# Patient Record
Sex: Male | Born: 1940 | Race: White | Hispanic: No | Marital: Married | Smoking: Former smoker
Health system: Southern US, Community
[De-identification: ages and names within clinical notes are randomized; demographics above are authoritative.]

## PROBLEM LIST (undated history)

## (undated) DIAGNOSIS — I10 Essential (primary) hypertension: Secondary | ICD-10-CM

## (undated) DIAGNOSIS — N400 Enlarged prostate without lower urinary tract symptoms: Secondary | ICD-10-CM

## (undated) DIAGNOSIS — N189 Chronic kidney disease, unspecified: Secondary | ICD-10-CM

## (undated) DIAGNOSIS — F419 Anxiety disorder, unspecified: Secondary | ICD-10-CM

## (undated) DIAGNOSIS — S2239XA Fracture of one rib, unspecified side, initial encounter for closed fracture: Secondary | ICD-10-CM

## (undated) DIAGNOSIS — E785 Hyperlipidemia, unspecified: Secondary | ICD-10-CM

## (undated) DIAGNOSIS — S22009A Unspecified fracture of unspecified thoracic vertebra, initial encounter for closed fracture: Secondary | ICD-10-CM

## (undated) DIAGNOSIS — R3912 Poor urinary stream: Secondary | ICD-10-CM

## (undated) DIAGNOSIS — I6529 Occlusion and stenosis of unspecified carotid artery: Secondary | ICD-10-CM

## (undated) DIAGNOSIS — I4891 Unspecified atrial fibrillation: Secondary | ICD-10-CM

## (undated) DIAGNOSIS — I739 Peripheral vascular disease, unspecified: Secondary | ICD-10-CM

## (undated) DIAGNOSIS — J942 Hemothorax: Secondary | ICD-10-CM

## (undated) DIAGNOSIS — R972 Elevated prostate specific antigen [PSA]: Secondary | ICD-10-CM

## (undated) DIAGNOSIS — G4700 Insomnia, unspecified: Secondary | ICD-10-CM

## (undated) DIAGNOSIS — I779 Disorder of arteries and arterioles, unspecified: Secondary | ICD-10-CM

## (undated) HISTORY — DX: Essential (primary) hypertension: I10

## (undated) HISTORY — DX: Benign prostatic hyperplasia without lower urinary tract symptoms: N40.0

## (undated) HISTORY — DX: Elevated prostate specific antigen (PSA): R97.20

## (undated) HISTORY — DX: Hemothorax: J94.2

## (undated) HISTORY — DX: Poor urinary stream: R39.12

## (undated) HISTORY — PX: OTHER SURGICAL HISTORY: SHX169

## (undated) HISTORY — PX: APPENDECTOMY: SHX54

## (undated) HISTORY — DX: Chronic kidney disease, unspecified: N18.9

## (undated) HISTORY — DX: Hyperlipidemia, unspecified: E78.5

## (undated) HISTORY — DX: Insomnia, unspecified: G47.00

## (undated) HISTORY — DX: Fracture of one rib, unspecified side, initial encounter for closed fracture: S22.39XA

## (undated) HISTORY — DX: Anxiety disorder, unspecified: F41.9

## (undated) HISTORY — DX: Occlusion and stenosis of unspecified carotid artery: I65.29

## (undated) HISTORY — DX: Unspecified fracture of unspecified thoracic vertebra, initial encounter for closed fracture: S22.009A

---

## 1998-11-23 ENCOUNTER — Ambulatory Visit (HOSPITAL_COMMUNITY): Admission: RE | Admit: 1998-11-23 | Discharge: 1998-11-23 | Payer: Self-pay | Admitting: *Deleted

## 2012-12-25 ENCOUNTER — Emergency Department: Payer: Self-pay | Admitting: Emergency Medicine

## 2012-12-25 LAB — COMPREHENSIVE METABOLIC PANEL
Albumin: 3.7 g/dL (ref 3.4–5.0)
Alkaline Phosphatase: 87 U/L (ref 50–136)
Anion Gap: 8 (ref 7–16)
BUN: 14 mg/dL (ref 7–18)
Chloride: 103 mmol/L (ref 98–107)
Co2: 26 mmol/L (ref 21–32)
Creatinine: 1.27 mg/dL (ref 0.60–1.30)
EGFR (African American): >60
EGFR (Non-African Amer.): 56 — ABNORMAL LOW
Glucose: 173 mg/dL — ABNORMAL HIGH (ref 65–99)
Sodium: 137 mmol/L (ref 136–145)

## 2012-12-25 LAB — CBC WITH DIFFERENTIAL/PLATELET
Basophil #: 0.1 10*3/uL (ref 0.0–0.1)
Eosinophil %: 0.1 %
HCT: 36.3 % — ABNORMAL LOW (ref 40.0–52.0)
HGB: 12.7 g/dL — ABNORMAL LOW (ref 13.0–18.0)
Lymphocyte #: 0.5 10*3/uL — ABNORMAL LOW (ref 1.0–3.6)
MCHC: 34.9 g/dL (ref 32.0–36.0)
Monocyte %: 5.2 %
Neutrophil #: 16.7 10*3/uL — ABNORMAL HIGH (ref 1.4–6.5)
Neutrophil %: 91.2 %
RDW: 12 % (ref 11.5–14.5)
WBC: 18.3 10*3/uL — ABNORMAL HIGH (ref 3.8–10.6)

## 2012-12-25 LAB — URINALYSIS, COMPLETE
Bilirubin,UR: NEGATIVE
Glucose,UR: NEGATIVE mg/dL (ref 0–75)
Hyaline Cast: 2
Ph: 8 (ref 4.5–8.0)
RBC,UR: 7 /HPF (ref 0–5)
Specific Gravity: 1.017 (ref 1.003–1.030)

## 2012-12-27 LAB — URINE CULTURE

## 2012-12-29 LAB — URINALYSIS, COMPLETE
Bilirubin,UR: NEGATIVE
Nitrite: NEGATIVE
Protein: 100
RBC,UR: 22 /HPF (ref 0–5)
Specific Gravity: 1.023 (ref 1.003–1.030)
Squamous Epithelial: NONE SEEN

## 2012-12-29 LAB — CBC
HCT: 30.8 % — ABNORMAL LOW (ref 40.0–52.0)
HGB: 10.4 g/dL — ABNORMAL LOW (ref 13.0–18.0)
MCH: 32.2 pg (ref 26.0–34.0)
MCHC: 33.9 g/dL (ref 32.0–36.0)
Platelet: 170 10*3/uL (ref 150–440)
WBC: 7.5 10*3/uL (ref 3.8–10.6)

## 2012-12-29 LAB — BASIC METABOLIC PANEL
Anion Gap: 4 — ABNORMAL LOW (ref 7–16)
BUN: 23 mg/dL — ABNORMAL HIGH (ref 7–18)
Chloride: 107 mmol/L (ref 98–107)
Co2: 26 mmol/L (ref 21–32)
EGFR (African American): 59 — ABNORMAL LOW
EGFR (Non-African Amer.): 51 — ABNORMAL LOW
Sodium: 137 mmol/L (ref 136–145)

## 2012-12-30 ENCOUNTER — Inpatient Hospital Stay: Payer: Self-pay | Admitting: Internal Medicine

## 2012-12-31 LAB — CBC WITH DIFFERENTIAL/PLATELET
Basophil #: 0 10*3/uL (ref 0.0–0.1)
Basophil %: 0.1 %
Eosinophil #: 0 10*3/uL (ref 0.0–0.7)
Eosinophil %: 0 %
HCT: 28.9 % — ABNORMAL LOW (ref 40.0–52.0)
HGB: 10.1 g/dL — ABNORMAL LOW (ref 13.0–18.0)
Lymphocyte #: 0.6 10*3/uL — ABNORMAL LOW (ref 1.0–3.6)
Lymphocyte %: 5.8 %
MCH: 32.9 pg (ref 26.0–34.0)
MCHC: 35 g/dL (ref 32.0–36.0)
Monocyte #: 0.4 x10 3/mm (ref 0.2–1.0)
Neutrophil #: 9.3 10*3/uL — ABNORMAL HIGH (ref 1.4–6.5)
Neutrophil %: 90.6 %
RBC: 3.07 10*6/uL — ABNORMAL LOW (ref 4.40–5.90)
WBC: 10.2 10*3/uL (ref 3.8–10.6)

## 2012-12-31 LAB — BASIC METABOLIC PANEL
Anion Gap: 9 (ref 7–16)
Calcium, Total: 9 mg/dL (ref 8.5–10.1)
Chloride: 109 mmol/L — ABNORMAL HIGH (ref 98–107)
Co2: 21 mmol/L (ref 21–32)
Creatinine: 1.21 mg/dL (ref 0.60–1.30)
EGFR (African American): >60
EGFR (Non-African Amer.): 60 — ABNORMAL LOW
Osmolality: 286 (ref 275–301)
Sodium: 139 mmol/L (ref 136–145)

## 2012-12-31 LAB — CULTURE, BLOOD (SINGLE)

## 2013-01-04 LAB — CULTURE, BLOOD (SINGLE)

## 2013-07-17 ENCOUNTER — Ambulatory Visit: Payer: Self-pay | Admitting: Vascular Surgery

## 2013-07-21 ENCOUNTER — Ambulatory Visit: Payer: Self-pay | Admitting: Vascular Surgery

## 2013-08-10 ENCOUNTER — Inpatient Hospital Stay: Payer: Self-pay | Admitting: Vascular Surgery

## 2013-08-10 LAB — BUN: BUN: 28 mg/dL — ABNORMAL HIGH (ref 7–18)

## 2013-08-10 LAB — CREATININE, SERUM
Creatinine: 1.57 mg/dL — ABNORMAL HIGH (ref 0.60–1.30)
EGFR (African American): 50 — ABNORMAL LOW
EGFR (Non-African Amer.): 43 — ABNORMAL LOW

## 2013-08-11 LAB — BASIC METABOLIC PANEL
BUN: 18 mg/dL (ref 7–18)
Chloride: 107 mmol/L (ref 98–107)
EGFR (Non-African Amer.): 49 — ABNORMAL LOW
Osmolality: 280 (ref 275–301)
Potassium: 4.2 mmol/L (ref 3.5–5.1)
Sodium: 139 mmol/L (ref 136–145)

## 2013-08-11 LAB — APTT: Activated PTT: 34.3 secs (ref 23.6–35.9)

## 2013-08-11 LAB — CBC WITH DIFFERENTIAL/PLATELET
Basophil %: 0.3 %
Eosinophil #: 0.1 10*3/uL (ref 0.0–0.7)
HCT: 34.9 % — ABNORMAL LOW (ref 40.0–52.0)
HGB: 12.6 g/dL — ABNORMAL LOW (ref 13.0–18.0)
Lymphocyte #: 1.3 10*3/uL (ref 1.0–3.6)
MCHC: 36 g/dL (ref 32.0–36.0)
MCV: 95 fL (ref 80–100)
Monocyte #: 0.6 x10 3/mm (ref 0.2–1.0)
Monocyte %: 9.2 %
Neutrophil #: 4.8 10*3/uL (ref 1.4–6.5)
Neutrophil %: 70 %
Platelet: 154 10*3/uL (ref 150–440)
RDW: 12 % (ref 11.5–14.5)

## 2013-08-11 LAB — PROTIME-INR
INR: 1.1
Prothrombin Time: 14.5 secs (ref 11.5–14.7)

## 2014-10-08 IMAGING — CR DG CHEST 2V
1 series · 2 of 2 positions shown · non-contrast
Comparison: none

REASON FOR EXAM: lll pna
COMMENTS:

[Series 6: w chest pa · 0.14mm/px · 2 of 2 slices shown]
[im 1/2]
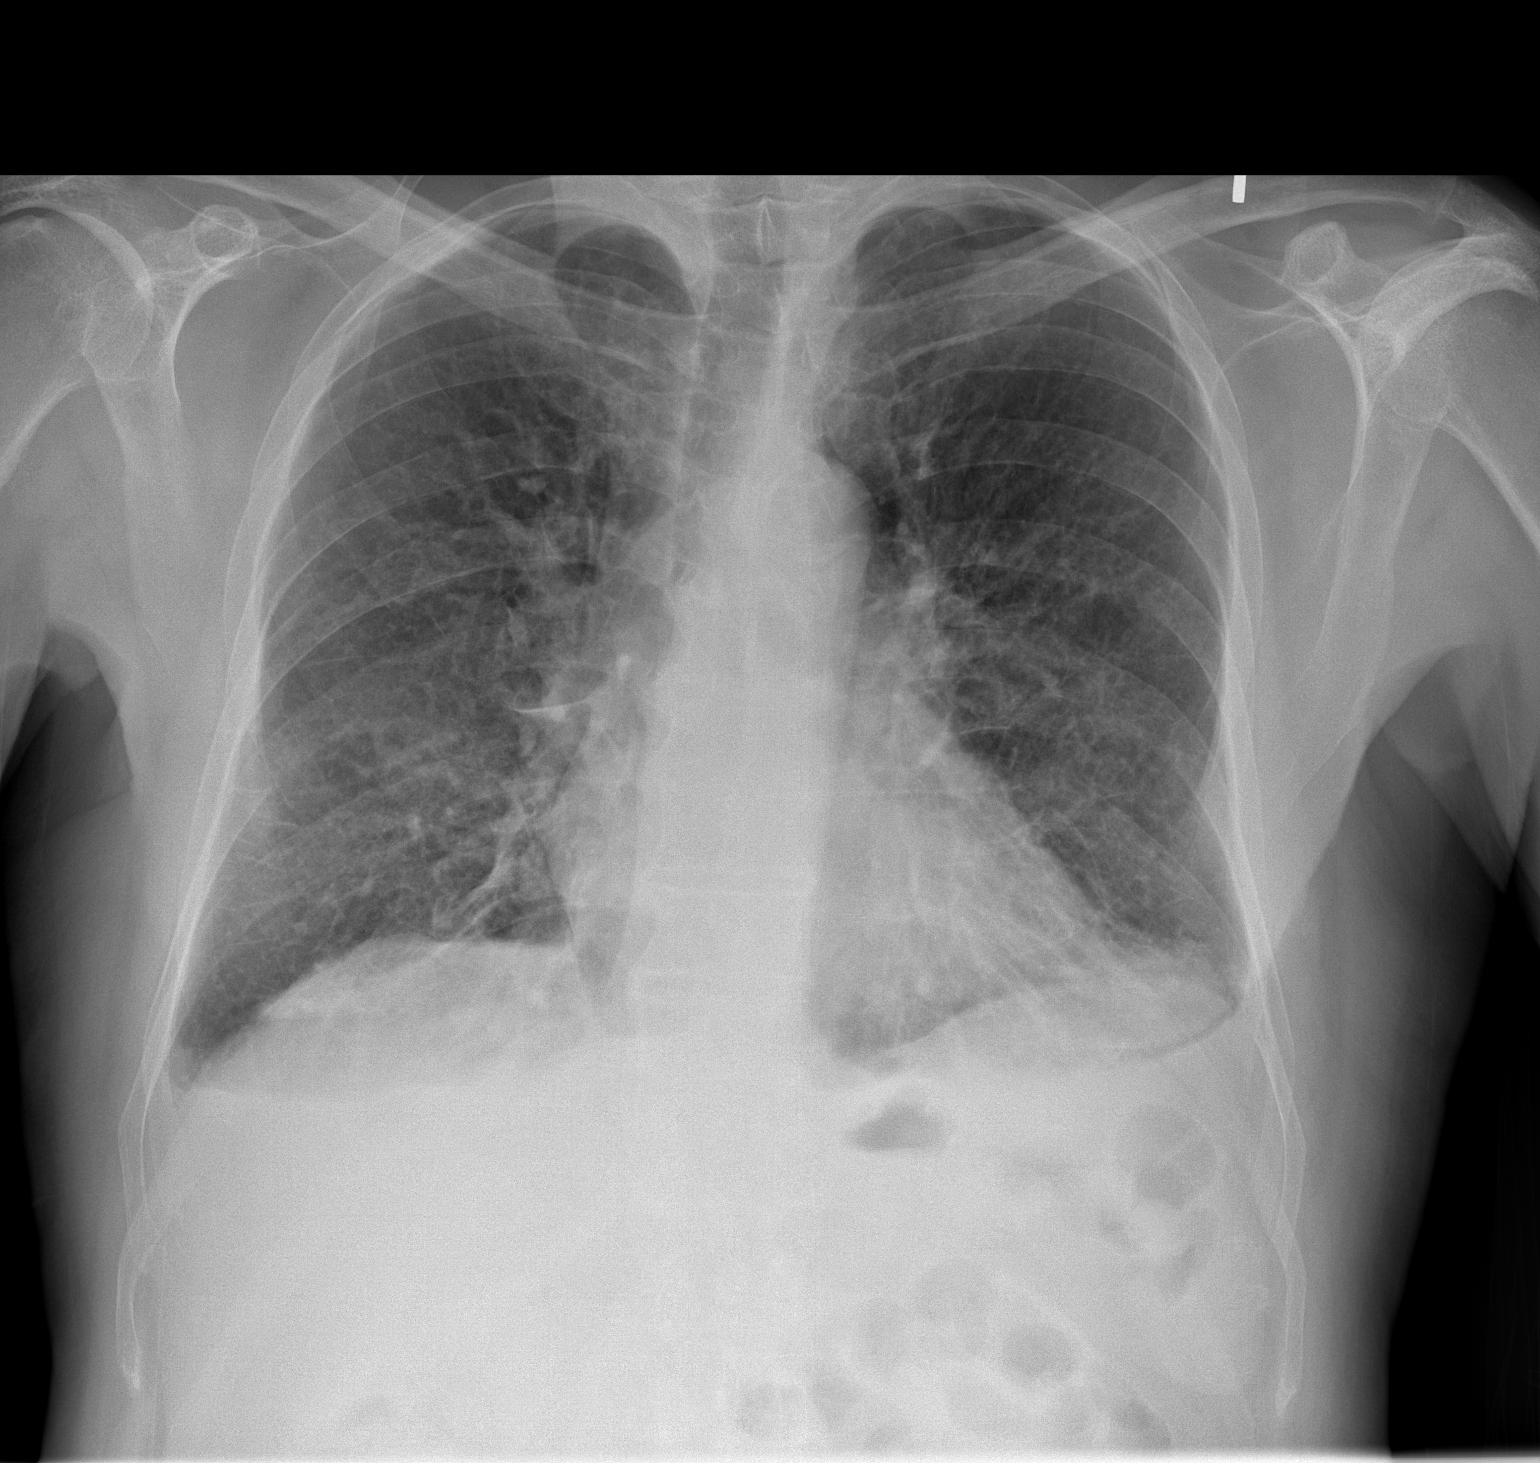
[im 2/2]
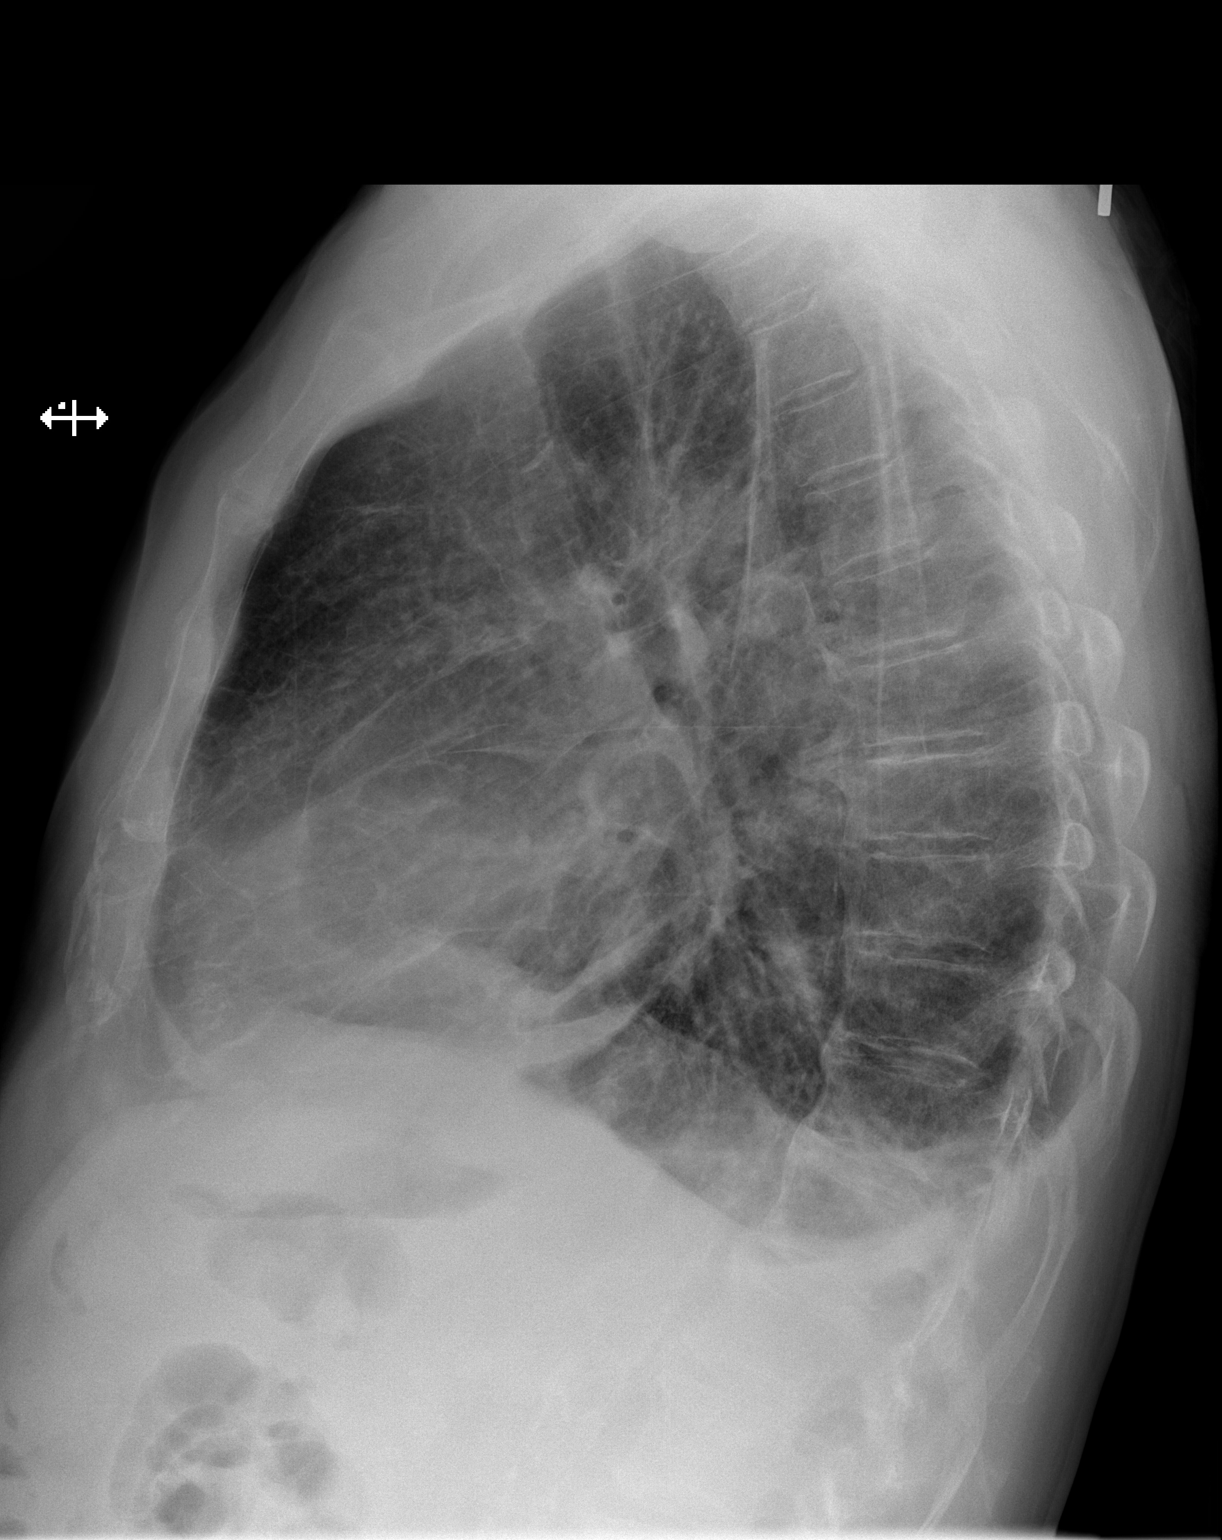

[2 of 2 positions shown; findings below may reference images not displayed]

PROCEDURE:     DXR - DXR CHEST PA (OR AP) AND LATERAL  - December 31, 2012 [DATE]

RESULT:     Comparison is made to the previous examination 29 December, 2012. There is prominence of the interstitium diffusely. The heart is normal
in size. There are small bilateral effusions. No focal consolidation is
evident. Bony structures are unremarkable.
IMPRESSION: Diffuse prominence of the interstitial markings. Small
bilateral effusions.

[REDACTED]

## 2015-02-25 NOTE — H&P (Signed)
PATIENT NAME:  Xavier Davis, TUCH MR#:  [id number] DATE OF BIRTH:  1940/11/08  DATE OF ADMISSION:  12/30/2012  PRIMARY CARE PHYSICIAN: Located at the [facility].  CHIEF COMPLAINT: Dysuria, cough and bacteremia.   HISTORY OF PRESENT ILLNESS: This is a 74 year old male patient with history of hypertension, diabetes mellitus, syncope and carotid artery stenosis who presents to the Emergency Room today after being called to come to the Emergency Room due to bacteremia. The patient had carotid endarterectomy done at the [place] on 12/23/2012 after an episode of syncope. The patient was later seen in the Emergency Room here at [facility], on 12/25/2012, with some cough and dysuria. The patient was found to have UTI and atelectasis and was started on Levaquin and sent home in a stable condition. As insurance could not cover his Levaquin, the patient was switched to amoxicillin. Today his blood cultures are positive for Serratia and the patient has been asked to return to the Emergency Room. He continues to have dysuria, dark urine and cough which is worsening. Does not have any shortness of breath or productive sputum. No abdominal pain. No CVA tenderness. Fever of 100.4 today in the Emergency Room.   PAST MEDICAL HISTORY: Hypertension, diabetes mellitus, syncope and carotid stenosis.   PAST SURGICAL HISTORY: Left carotid endarterectomy.   SOCIAL HISTORY: The patient does not smoke, no alcohol, no illicit drugs. Lives with his wife. Ambulates on his own.   CODE STATUS: FULL CODE.   FAMILY HISTORY: Positive for hypertension and diabetes.   REVIEW OF SYSTEMS: CONSTITUTIONAL: Complains of fever and fatigue.  EYES: No blurred vision, pain or redness.  ENT: No tinnitus, ear pain or hearing loss.  RESPIRATORY: Has cough. No dyspnea. Has some wheezing.  CARDIOVASCULAR: No chest pain, orthopnea or edema.  GASTROINTESTINAL: No nausea, vomiting, diarrhea or abdominal pain.  GENITOURINARY:  Has dysuria and hematuria. Has dark urine and frequency.  ENDOCRINE: No polyuria, nocturia or thyroid problems.  HEMATOLOGIC/LYMPHATIC: No anemia, easy bruising or bleeding.  INTEGUMENTARY: No acne, rash or lesions. MUSCULOSKELETAL: No back pain or arthritis. NEUROLOGIC: No focal numbness, weakness or dysarthria. Had recent syncope.  PSYCHIATRIC: No anxiety or depression.   HOME MEDICATIONS: 1.  Aspirin 325 mg oral once a day.  2.  Cetirizine 10 mg oral once a day. 3.  Citalopram 20 mg oral once a day.  4.  Amoxicillin 500 mg oral twice a day.  5.  Lovastatin 20 mg oral once a day.  6.  Oxycodone 5 mg 1 tablet oral every 4 hours as needed for pain.  7.  Terazosin 2 mg oral once a day at bedtime.  8.  Trazodone 50 mg oral once a day at bedtime.   PHYSICAL EXAMINATION: VITAL SIGNS: Temperature 100.4, pulse 78, respirations 18, blood pressure 122/61. GENERAL: Elderly Caucasian male patient lying in bed, coughing.  PSYCHIATRIC: Alert, oriented x 3. Mood and affect appropriate. Judgment intact.  HEENT: Atraumatic, normocephalic. Oral mucosa moist and pink. External ears and nose normal. No pallor. No icterus. Pupils bilaterally equal and reactive to light.  NECK: Supple. No thyromegaly. No palpable lymph nodes. Trachea midline. No carotid bruit or JVD.  HEART: S1 and S2 regular rate and rhythm without any murmurs.  LUNGS: Normal work of breathing. Clear to auscultation on both sides.  ABDOMEN: Soft abdomen, nontender. Bowel sounds present. No hepatosplenomegaly palpable.  GENITOURINARY: No CVA tenderness or bladder distention. Has suprapubic tenderness.  SKIN: Warm and dry. No petechiae, rash or ulcers. Has  surgical wound over the left carotid.  MUSCULOSKELETAL: No joint swelling, redness or effusion of the large joints. Normal muscle tone.  NEUROLOGICAL: Motor strength 5 out of 5 in upper and lower extremities. Sensation to fine touch intact all over. Cranial nerves II through XII intact.   LYMPHATIC: No cervical lymphadenopathy.   LABS STUDIES: Glucose 125, BUN 23, creatinine 1.39, sodium 137, potassium 4.9. GFR 51. WBC 7.5, hemoglobin 10.4 and platelets 170.   Urine cultures from 12/25/2012 have Serratia sensitive to ceftriaxone, Levaquin, ciprofloxacin and Bactrim.   Blood cultures have Serratia positive.   RADIOLOGIC DATA: Chest x-ray shows left lower lobe pneumonia with small effusion.   ASSESSMENT AND PLAN:  1.  Serratia bacteremia with urinary tract infection. Will start the patient on Levaquin, repeat blood cultures and urine cultures. The patient has a fever of 100.4 and will need to be admitted as inpatient and started on IV antibiotics. No sepsis.  2.  Left lower lobe pneumonia with synpneumonic effusion. We will continue the Levaquin. The patient did not have greater than 48 hour stay in the hospital, will not need any broad-spectrum antibiotics. Get sputum cultures. The patient does have mild wheezing on exam. Will start him on steroids, nebulizers and oxygen p.r.n.  3.  Acute renal failure. Start intravenous fluids.  4.  Mild anemia, likely chronic. Will repeat in the morning.  5.  Diabetes mellitus. Sliding scale insulin and diabetic diet.  6.  Hypertension. Continue home medications.  7.  Deep vein thrombosis prophylaxis with heparin.   CODE STATUS: FULL CODE.   TIME SPENT: Today on this case was 65 minutes.  ____________________________ Leia Alf Vassie Kugel, MD srs:sb D: 12/30/2012 04:41:14 ET T: 12/30/2012 08:41:31 ET JOB#: [id number]  cc: Alveta Heimlich R. Harlie Ragle, MD, <Dictator> [place] SIGNED 01/03/2013 15:24

## 2015-02-25 NOTE — Op Note (Signed)
PATIENT NAME:  Xavier Davis, Xavier Davis MR#:  [id number] DATE OF BIRTH:  [birth year]  DATE OF OPERATION:  08/10/2013  PREOPERATIVE DIAGNOSES: 1.  Recurrent high-grade left carotid artery stenosis. 2.  Hyperlipidemia.  3.  Hypertension.  4.  Chronic kidney disease.   POSTOPERATIVE DIAGNOSES: 1.  Recurrent high-grade left carotid artery stenosis. 2.  Hyperlipidemia.  3.  Hypertension.  4.  Chronic kidney disease.   PROCEDURES: 1.  Catheter placement to the left internal carotid artery from right femoral approach.  2.  Thoracic aortogram and cervical and cerebral left carotid arteriogram.  3.  Placement of a left carotid artery stent using an 8 mm diameter, proximal 6 mm diameter distal, 40 mm long tapered stent in the small NAV6 embolic protection device, left carotid artery.  4.  StarClose closure device, right femoral artery.   SURGEON: Lakai Moree.   ANESTHESIA: Local, with moderate conscious sedation.   BLOOD LOSS: 25 mL.   FLUOROSCOPY TIME: Approximately 6 minutes and 80 mL of contrast were used.   INDICATION FOR PROCEDURE: This is a 74 year old white male who had a carotid endarterectomy performed at the [place] earlier this year. This was done for high-grade left carotid artery stenosis and moderate right carotid artery stenosis.   This was performed as ultrasound done by his primary care physician a suggested significant recurrent left carotid artery stenosis. A CT angiogram was performed which also showed high-grade left carotid artery stenosis, which was recurrent. He was brought back for angiography and intervention to the left carotid artery for recurrent stenosis. The risks and benefits were discussed. Informed consent was obtained.   DESCRIPTION OF THE PROCEDURE: The patient was brought to the vascular interventional radiology suite. Groins were shaved and prepped and a sterile surgical field was created. The right femoral head was localized with fluoroscopy and the right femoral artery was  accessed without difficulty with a Seldinger needle. A J wire and a 6-French sheath were placed. The patient was given a total of 6000 units of intravenous heparin for systemic anticoagulation and a pigtail catheter was placed in the ascending aorta. LAO projection aortogram was performed which showed normal origins of the great vessels. I then used a JB 2 catheter to selectively cannulate the left common carotid artery and advanced to the distal left common carotid artery without difficulty with a stiff angled Glidewire.   Imaging of the cervical portion of the left carotid artery demonstrated a 95% or greater stenosis of the left internal carotid artery less than 1 cm beyond its origin. The external carotid artery was widely patent. I advanced into the external carotid artery with a JB 2 catheter and a stiff angled Glidewire. I then exchanged for an Amplatz Super Stiff wire. This was in the proximal and distal common carotid artery.   Cervical and cerebral imaging was then performed. The high-grade cervical stenosis was again identified. Intracerebrally, there was no anterior cerebral filling and very diminished middle cerebral filling on our initial images.   I then crossed the lesion without difficulty with a NAV6 embolic protection device, deployed this in the distal internal carotid artery, and then crossed the lesion with an 8 mm diameter, proximal 6 mm diameter, 40 mm long self-expanding stent. This was postdilated with a 4.5 mm balloon with pretreatment with atropine given as his baseline heart rate was in the 60s. No additional treatment for bradyarrhythmia was required.   A completion angiogram following this showed excellent flow through the stent, with a 20% or less residual  stenosis. Intracerebrally there was still not significant anterior cerebral filling, but the middle cerebral filling was brisk and normal. At this point we elected to terminate the procedure.   The sheath was removed.  StarClose closure device was deployed in the usual fashion, with an excellent hemostatic result. The patient tolerated the procedure well and was taken to the recovery room in stable condition.     ____________________________ Algernon Huxley, MD jsd:dm D: 08/10/2013 09:35:08 ET T: 08/10/2013 09:46:58 ET JOB#: [id number]  cc: Algernon Huxley, MD, <Dictator> Algernon Huxley MD ELECTRONICALLY SIGNED 08/26/2013 10:21

## 2015-02-25 NOTE — Discharge Summary (Signed)
PATIENT NAME:  Xavier Davis, Xavier Davis MR#:  [id number] DATE OF BIRTH:  1941/07/26  DATE OF ADMISSION:  12/30/2012 DATE OF DISCHARGE:  12/31/2012  DISCHARGE DIAGNOSES: 1. Serratia bacteremia with urinary tract infection, improving with antibiotic. 2. Left lower lobe pneumonia, ruled out on repeat chest x-ray; no pulmonary symptoms; no pneumonia.  3. Acute renal failure, resolved with IV fluids; likely prerenal.   SECONDARY DIAGNOSES: 1. Hypertension.  2. Diabetes.  3. Syncope.  4. Carotid stenosis.   CONSULTATIONS: None.   PROCEDURES AND RADIOLOGY: Chest x-ray on the 26th of February showed small bilateral effusion and diffuse prominence of interstitial markings.   Chest x-ray on the 24th of February showed possible subsegmental atelectasis. No alveolar  pneumonia.   MAJOR LABORATORY PANEL: UA on admission showed 589 WBCs, 1+ bacteria, WBCs in clumps present, 3+ leuk esterase.   Blood cultures x 2 were negative.   Previous urine culture grew Serratia marcescens, more than 100,000 colonies, and 1 anaerobic bottle also grew Serratia marcescens, similar organism.   The repeat culture on the 25th of February was negative x 2; blood culture.   HISTORY AND SHORT HOSPITAL COURSE: The patient is a 74 year old male with the above-mentioned medical problems who was admitted for Serratia bacteremia with UTI, was started on IV Levaquin. Please see Dr. Boykin Reaper dictated history and physical for further details. The patient had a repeat blood culture drawn on the 25th of February, which remained negative. His UA was still showing a UTI, for which he was continued on IV antibiotic, and based on previous urine culture and sensitivity along with blood culture and sensitivity, his antibiotic was switched to oral ciprofloxacin. He was discharged home in stable condition on the 26th of February, as he did not have any further symptoms.   On the date of discharge, his vital signs are as follows: Temperature 98.1,  heart rate 75 per minute, respirations 19 per minute, blood pressure 123/61 mmHg, saturating 93% on room air.   PERTINENT PHYSICAL EXAMINATION ON THE DATE OF DISCHARGE:  CARDIOVASCULAR: S1, S2 normal. No murmurs, rubs or gallops.  LUNGS: Clear to auscultation bilaterally. No wheezing, rales, rhonchi. Clear to auscultation.  ABDOMEN: Soft, benign.  NEUROLOGIC: Nonfocal examination.  All other physical examination remained at the baseline.    DISCHARGE MEDICATIONS:  1. Aspirin 325 mg p.o. daily.  2. Cetirizine 10 mg p.o. daily.  3. Terazosin 2 mg p.o. at bedtime.  4. Trazodone 50 mg p.o. at bedtime.  5. Lovastatin 20 mg p.o. at bedtime.  6. Citalopram 20 daily p.o. daily.  7. Oxycodone 5 mg at 1 to 2 tablets p.o. every 4 hours as needed.  8. Ciprofloxacin 500 mg p.o. b.i.d.  for 10 days.  9. Combivent 2 puffs inhaled twice a day as needed.   DISCHARGE DIET: Regular.   DISCHARGE ACTIVITY: As tolerated.   DISCHARGE INSTRUCTIONS AND FOLLOWUP: The patient was instructed to follow up with his primary care physician at the [facility] in 1 to 2 weeks.  Total time spent on this patient was 55 minutes.       ____________________________ Lucina Mellow. Manuella Ghazi, MD vss:dm D: 01/04/2013 12:15:00 ET T: 01/04/2013 13:14:39 ET JOB#: [id number]  cc: * [place] Manuella Ghazi, MD, <Dictator>     Lucina Mellow [facility] MD ELECTRONICALLY SIGNED 01/05/2013 10:07

## 2015-06-15 ENCOUNTER — Encounter: Payer: Self-pay | Admitting: *Deleted

## 2015-06-23 ENCOUNTER — Encounter: Payer: Self-pay | Admitting: Urology

## 2015-06-23 ENCOUNTER — Ambulatory Visit (INDEPENDENT_AMBULATORY_CARE_PROVIDER_SITE_OTHER): Payer: Medicare Other | Admitting: Urology

## 2015-06-23 VITALS — BP 153/67 | HR 67 | Ht 67.0 in | Wt 151.3 lb

## 2015-06-23 DIAGNOSIS — N401 Enlarged prostate with lower urinary tract symptoms: Secondary | ICD-10-CM | POA: Diagnosis not present

## 2015-06-23 DIAGNOSIS — R339 Retention of urine, unspecified: Secondary | ICD-10-CM

## 2015-06-23 DIAGNOSIS — N138 Other obstructive and reflux uropathy: Secondary | ICD-10-CM

## 2015-06-23 DIAGNOSIS — R972 Elevated prostate specific antigen [PSA]: Secondary | ICD-10-CM | POA: Diagnosis not present

## 2015-06-23 DIAGNOSIS — N400 Enlarged prostate without lower urinary tract symptoms: Secondary | ICD-10-CM | POA: Insufficient documentation

## 2015-06-23 LAB — BLADDER SCAN AMB NON-IMAGING: Scan Result: 113

## 2015-06-23 MED ORDER — TAMSULOSIN HCL 0.4 MG PO CAPS: 0.4000 mg | ORAL_CAPSULE | Freq: Every day | ORAL | Status: DC

## 2015-06-23 NOTE — Progress Notes (Signed)
06/23/2015 9:28 PM   Xavier Davis 01-Jan-1941 [id number]  Referring provider: No referring provider defined for this encounter.  Chief Complaint  Patient presents with  . Elevated PSA    6 month recheck  . Benign Prostatic Hypertrophy    HPI: Xavier Davis is a 74 year old white male with an elevated PSA, BPH with LUTS and incomplete bladder emptying who presents today for his 6 months follow up.    ELEVATED PSA: Patient has a history of elevated PSA with reaching levels of 8.30 ng/mL in 2014. He subsequently underwent a prostate biopsy and no malignancy was found. He is currently on finasteride 5 mg daily and PSA's are drawn on a six-month basis.  His most recent PSA was 4.2 on 12/23/2014  BPH WITH LUTS: His IPSS score today is 10, which is moderate lower urinary tract symptomatology. He is mixed with his quality life due to his urinary symptoms. His PVR is 113 mL.  His previous IPSS score was 11/3.  His previous PVR is 89 mL.    His major complaint today incomplete bladder emptying.  He has had these symptoms for two years.  He denies any dysuria, hematuria or suprapubic pain.   He currently taking finasteride and Hytrin 5mg .  His has had a biopsy in 2014.  No malignancy was found.     He also denies any recent fevers, chills, nausea or vomiting.  He does not have a family history of PCa.      IPSS      06/23/15 0900       International Prostate Symptom Score   How often have you had the sensation of not emptying your bladder? Almost always     How often have you had to urinate less than every two hours? About half the time     How often have you found you stopped and started again several times when you urinated? Not at All     How often have you found it difficult to postpone urination? Not at All     How often have you had a weak urinary stream? Less than 1 in 5 times     How often have you had to strain to start urination? Not at All     How many times did you  typically get up at night to urinate? 1 Time     Total IPSS Score 10     Quality of Life due to urinary symptoms   If you were to spend the rest of your life with your urinary condition just the way it is now how would you feel about that? Mixed        Score:  1-7 Mild 8-19 Moderate 20-35 Severe    PMH: Past Medical History  Diagnosis Date  . HLD (hyperlipidemia)   . Anxiety   . Insomnia   . Carotid stenosis     bilateral  . CKD (chronic kidney disease)   . Elevated PSA   . Weak urinary stream   . BPH (benign prostatic hyperplasia)   . HTN (hypertension)     Surgical History: Past Surgical History  Procedure Laterality Date  . Carotid stenosis Left     ces and stent placement  . Appendectomy      Home Medications:    Medication List       This list is accurate as of: 06/23/15 11:59 PM.  Always use your most recent med list.  amLODipine 5 MG tablet  Commonly known as:  NORVASC  Take by mouth.     aspirin 325 MG tablet  Take 325 mg by mouth daily.     citalopram 40 MG tablet  Commonly known as:  CELEXA  Take by mouth.     finasteride 5 MG tablet  Commonly known as:  PROSCAR  Take 5 mg by mouth daily.     hydrochlorothiazide 25 MG tablet  Commonly known as:  HYDRODIURIL  Take by mouth.     lisinopril 20 MG tablet  Commonly known as:  PRINIVIL,ZESTRIL  Take 20 mg by mouth daily.     lovastatin 20 MG tablet  Commonly known as:  MEVACOR  Take 20 mg by mouth at bedtime.     simvastatin 20 MG tablet  Commonly known as:  ZOCOR  Take 20 mg by mouth daily.     tamsulosin 0.4 MG Caps capsule  Commonly known as:  FLOMAX  Take 1 capsule (0.4 mg total) by mouth daily.     terazosin 5 MG capsule  Commonly known as:  HYTRIN  Take 5 mg by mouth at bedtime.        Allergies: No Known Allergies  Family History: Family History  Problem Relation Age of Onset  . Prostate cancer Neg Hx   . CVA Father   . Diabetes Mellitus II Father    . Kidney disease Neg Hx     Social History:  reports that he has quit smoking. He does not have any smokeless tobacco history on file. He reports that he drinks alcohol. He reports that he does not use illicit drugs.  ROS: UROLOGY Frequent Urination?: No Hard to postpone urination?: No Burning/pain with urination?: No Get up at night to urinate?: No Leakage of urine?: Yes Urine stream starts and stops?: No Trouble starting stream?: No Do you have to strain to urinate?: No Blood in urine?: No Urinary tract infection?: No Sexually transmitted disease?: No Injury to kidneys or bladder?: No Painful intercourse?: No Weak stream?: No Erection problems?: No Penile pain?: No  Gastrointestinal Nausea?: No Vomiting?: No Indigestion/heartburn?: No Diarrhea?: No Constipation?: No  Constitutional Fever: No Night sweats?: No Weight loss?: No Fatigue?: No  Skin Skin rash/lesions?: No Itching?: No  Eyes Blurred vision?: No Double vision?: No  Ears/Nose/Throat Sore throat?: No Sinus problems?: No  Hematologic/Lymphatic Swollen glands?: No Easy bruising?: No  Cardiovascular Leg swelling?: No Chest pain?: No  Respiratory Cough?: No Shortness of breath?: No  Endocrine Excessive thirst?: No  Musculoskeletal Back pain?: No Joint pain?: No  Neurological Headaches?: No Dizziness?: No  Psychologic Depression?: No Anxiety?: No  Physical Exam: BP 153/67 mmHg  Pulse 67  Ht 5\' 7"  (1.702 m)  Wt 151 lb 4.8 oz (68.629 kg)  BMI 23.69 kg/m2  GU: Patient with circumcised phallus.  Urethral meatus is patent.  No penile discharge. No penile lesions or rashes. Scrotum without lesions, cysts, rashes and/or edema.  Testicles are located scrotally bilaterally. No masses are appreciated in the testicles. Left and right epididymis are normal. Rectal: Patient with  normal sphincter tone. Perineum without scarring or rashes. No rectal masses are appreciated. Prostate is  approximately 45 grams, no nodules are appreciated. Seminal vesicles are normal.   Laboratory Data: Lab Results  Component Value Date   WBC 6.8 08/11/2013   HGB 12.6* 08/11/2013   HCT 34.9* 08/11/2013   MCV 95 08/11/2013   PLT 154 08/11/2013    Lab Results  Component Value  Date   CREATININE 1.43* 08/11/2013    Lab Results  Component Value Date   PSA 1.7 06/23/2015   PSA history:  4.1 ng/mL on 06/22/2014             4.2 ng/mL on 12/23/2014  Pertinent Imaging: Results for orders placed or performed in visit on 06/23/15  PSA  Result Value Ref Range   Prostate Specific Ag, Serum 1.7 0.0 - 4.0 ng/mL  BLADDER SCAN AMB NON-IMAGING  Result Value Ref Range   Scan Result 113     Assessment & Plan:    1. BPH with LUTS:   Patient's IPSS score is 10/3.  His PVR 113 mL.  His DRE demonstrates mild enlargement, no nodules.  I will have him discontinue his Hytrin and start Flomax.   He is advised to take the tamsulosin  30 minutes after a meal.  I advised him of the side effects, such as: retrograde ejaculation, sinus congestion, nasal congestion, rhinorrhea, rhinitis, dizziness, and seasonal allergic rhinitis. Patient would like follow up in 6 months for a  DRE, PVR and an IPSS.     - PSA  2. Incomplete bladder emptying:   Patient will discontinue the Hytrin and start Flomax. He will return in 6 months time for a PVR and IPSS.    Return in about 6 months (around 12/24/2015) for IPSS AND PVR.  Zara Council, [facility] [address], [place], [place] [address] [phone]

## 2015-06-24 LAB — PSA: Prostate Specific Ag, Serum: 1.7 ng/mL (ref 0.0–4.0)

## 2015-06-27 ENCOUNTER — Telehealth: Payer: Self-pay

## 2015-06-27 DIAGNOSIS — R972 Elevated prostate specific antigen [PSA]: Secondary | ICD-10-CM

## 2015-06-27 NOTE — Telephone Encounter (Signed)
Spoke with pt in reference to PSA results. Pt voiced understanding. Pt has lab and f/u appt.

## 2015-06-27 NOTE — Telephone Encounter (Signed)
-----   Message from Nori Riis, PA-C sent at 06/26/2015  7:04 PM EDT ----- Patient's PSA is stable.  We will see him in 6 months.  PSA to be drawn before his next appointment.

## 2015-07-02 DIAGNOSIS — R339 Retention of urine, unspecified: Secondary | ICD-10-CM | POA: Insufficient documentation

## 2015-12-19 ENCOUNTER — Other Ambulatory Visit: Payer: Medicare Other

## 2015-12-19 DIAGNOSIS — R972 Elevated prostate specific antigen [PSA]: Secondary | ICD-10-CM

## 2015-12-20 LAB — PSA: Prostate Specific Ag, Serum: 2.5 ng/mL (ref 0.0–4.0)

## 2015-12-23 ENCOUNTER — Ambulatory Visit (INDEPENDENT_AMBULATORY_CARE_PROVIDER_SITE_OTHER): Payer: Medicare Other | Admitting: Urology

## 2015-12-23 ENCOUNTER — Encounter: Payer: Self-pay | Admitting: Urology

## 2015-12-23 VITALS — BP 167/65 | HR 71 | Ht 67.0 in | Wt 154.6 lb

## 2015-12-23 DIAGNOSIS — R339 Retention of urine, unspecified: Secondary | ICD-10-CM | POA: Diagnosis not present

## 2015-12-23 DIAGNOSIS — N401 Enlarged prostate with lower urinary tract symptoms: Secondary | ICD-10-CM | POA: Diagnosis not present

## 2015-12-23 DIAGNOSIS — N138 Other obstructive and reflux uropathy: Secondary | ICD-10-CM

## 2015-12-23 LAB — BLADDER SCAN AMB NON-IMAGING: SCAN RESULT: 89

## 2015-12-23 MED ORDER — TAMSULOSIN HCL 0.4 MG PO CAPS: 0.4000 mg | ORAL_CAPSULE | Freq: Every day | ORAL | Status: DC

## 2015-12-23 MED ORDER — FINASTERIDE 5 MG PO TABS: 5.0000 mg | ORAL_TABLET | Freq: Every day | ORAL | Status: DC

## 2015-12-23 NOTE — Patient Instructions (Addendum)
You are to take both tamsulosin 0.4 mg daily and finasteride 5 mg daily.  You were to discontinue the terazosin.

## 2015-12-23 NOTE — Progress Notes (Signed)
8:59 AM   Xavier Davis [birth year] [id number]  Referring provider: Tracie Harrier, MD [address] [facility] [place], [place] [address]  Chief Complaint  Patient presents with  . Benign Prostatic Hypertrophy      6 mo follow up    HPI: Xavier Davis is a 75 year old white male with an elevated PSA, BPH with LUTS and incomplete bladder emptying who presents today for his 6 months follow up.    ELEVATED PSA: Patient has a history of elevated PSA with reaching levels of 8.30 ng/mL in 2014. He subsequently underwent a prostate biopsy and no malignancy was found. He is currently on finasteride 5 mg daily and PSA's are drawn on a six-month basis.  His most recent PSA was 2.5 ng/mL on 12/19/2015.  He stopped his finasteride after his last visit, so this may explain why the PSA has slightly increased.   BPH WITH LUTS: His IPSS score today is 15, which is moderate lower urinary tract symptomatology. He is mixed with his quality life due to his urinary symptoms. His PVR is 89 mL.  His previous IPSS score was 10/3.  His previous PVR is 113 mL.  His major complaint today is waking up with damp underwear.  He is unsure if it is due to urine or sweat .  He has had these symptoms since his last visit.  He denies any dysuria, hematuria or suprapubic pain.  He currently taking Hytrin 5mg .  He was to discontinue the terazosin and start tamsulosin and finasteride.  His has had a biopsy in 2014.  No malignancy was found.  He also denies any recent fevers, chills, nausea or vomiting.  He does not have a family history of PCa.      IPSS      12/23/15 0800       International Prostate Symptom Score   How often have you had the sensation of not emptying your bladder? Less than half the time     How often have you had to urinate less than every two hours? About half the time     How often have you found you stopped and started again several times when you urinated? About half the time      How often have you found it difficult to postpone urination? About half the time     How often have you had a weak urinary stream? Less than half the time     How often have you had to strain to start urination? Less than 1 in 5 times     How many times did you typically get up at night to urinate? 1 Time     Total IPSS Score 15     Quality of Life due to urinary symptoms   If you were to spend the rest of your life with your urinary condition just the way it is now how would you feel about that? Mixed        Score:  1-7 Mild 8-19 Moderate 20-35 Severe    PMH: Past Medical History  Diagnosis Date  . HLD (hyperlipidemia)   . Anxiety   . Insomnia   . Carotid stenosis     bilateral  . CKD (chronic kidney disease)   . Elevated PSA   . Weak urinary stream   . BPH (benign prostatic hyperplasia)   . HTN (hypertension)     Surgical History: Past Surgical History  Procedure Laterality Date  .  Carotid stenosis Left     ces and stent placement  . Appendectomy      Home Medications:    Medication List       This list is accurate as of: 12/23/15  8:59 AM.  Always use your most recent med list.               amLODipine 5 MG tablet  Commonly known as:  NORVASC  Take by mouth.     aspirin 325 MG tablet  Take 325 mg by mouth daily.     citalopram 40 MG tablet  Commonly known as:  CELEXA  Take by mouth.     finasteride 5 MG tablet  Commonly known as:  PROSCAR  Take 1 tablet (5 mg total) by mouth daily.     hydrALAZINE 25 MG tablet  Commonly known as:  APRESOLINE  Take by mouth.     hydrochlorothiazide 25 MG tablet  Commonly known as:  HYDRODIURIL  Take by mouth.     lisinopril 20 MG tablet  Commonly known as:  PRINIVIL,ZESTRIL  Take 20 mg by mouth daily.     lovastatin 20 MG tablet  Commonly known as:  MEVACOR  Take 20 mg by mouth at bedtime.     sildenafil 100 MG tablet  Commonly known as:  VIAGRA  Take by mouth. Reported on 12/23/2015      simvastatin 20 MG tablet  Commonly known as:  ZOCOR  Take 20 mg by mouth daily.     tamsulosin 0.4 MG Caps capsule  Commonly known as:  FLOMAX  Take 1 capsule (0.4 mg total) by mouth daily.        Allergies: No Known Allergies  Family History: Family History  Problem Relation Age of Onset  . Prostate cancer Neg Hx   . CVA Father   . Diabetes Mellitus II Father   . Kidney disease Neg Hx     Social History:  reports that he has quit smoking. He does not have any smokeless tobacco history on file. He reports that he drinks alcohol. He reports that he does not use illicit drugs.  ROS: UROLOGY Frequent Urination?: No Hard to postpone urination?: No Burning/pain with urination?: No Get up at night to urinate?: No Leakage of urine?: Yes Urine stream starts and stops?: No Trouble starting stream?: No Do you have to strain to urinate?: No Blood in urine?: No Urinary tract infection?: No Sexually transmitted disease?: No Injury to kidneys or bladder?: No Painful intercourse?: No Weak stream?: No Erection problems?: No Penile pain?: No  Gastrointestinal Nausea?: No Vomiting?: No Indigestion/heartburn?: No Diarrhea?: No Constipation?: No  Constitutional Fever: No Night sweats?: No Weight loss?: No Fatigue?: No  Skin Skin rash/lesions?: No Itching?: No  Eyes Blurred vision?: No Double vision?: No  Ears/Nose/Throat Sore throat?: No Sinus problems?: No  Hematologic/Lymphatic Swollen glands?: No Easy bruising?: No  Cardiovascular Leg swelling?: No Chest pain?: No  Respiratory Cough?: No Shortness of breath?: No  Endocrine Excessive thirst?: No  Musculoskeletal Back pain?: No Joint pain?: No  Neurological Headaches?: No Dizziness?: No  Psychologic Depression?: No Anxiety?: No  Physical Exam: BP 167/65 mmHg  Pulse 71  Ht 5\' 7"  (1.702 m)  Wt 154 lb 9.6 oz (70.126 kg)  BMI 24.21 kg/m2  GU: Patient with circumcised phallus.  Urethral  meatus is patent.  No penile discharge. No penile lesions or rashes. Scrotum without lesions, cysts, rashes and/or edema.  Testicles are located scrotally bilaterally. No masses  are appreciated in the testicles. Left and right epididymis are normal. Rectal: Patient with  normal sphincter tone. Perineum without scarring or rashes. No rectal masses are appreciated. Prostate is approximately 55 grams, no nodules are appreciated. Seminal vesicles are normal.   Laboratory Data: Lab Results  Component Value Date   WBC 6.8 08/11/2013   HGB 12.6* 08/11/2013   HCT 34.9* 08/11/2013   MCV 95 08/11/2013   PLT 154 08/11/2013    Lab Results  Component Value Date   CREATININE 1.43* 08/11/2013     PSA history:  4.1 ng/mL on 06/22/2014             4.2 ng/mL on 12/23/2014  1.7 ng/mL on 06/23/2015-stopped taking the finasteride  2.5 ng/mL on 12/19/2015  Pertinent Imaging: Results for orders placed or performed in visit on 12/23/15  BLADDER SCAN AMB NON-IMAGING  Result Value Ref Range   Scan Result 89     Assessment & Plan:    1. BPH with LUTS:   Patient's IPSS score is 15/3.  His PVR 89 mL.  His DRE demonstrates  enlargement, no nodules.  I will have him discontinue his Hytrin and start Flomax.   He will also restart his finasteride.  He is advised to take the tamsulosin  30 minutes after a meal.  I advised him of the side effects, such as: retrograde ejaculation, sinus congestion, nasal congestion, rhinorrhea, rhinitis, dizziness, and seasonal allergic rhinitis. Patient would like follow up in 6 months for a  DRE, PVR and an IPSS score.    2. Incomplete bladder emptying:   Patient will discontinue the Hytrin and start Flomax. He will restart his finasteride.  He will return in 6 months time for a PVR and IPSS.    Return in about 6 months (around 06/21/2016) for IPSS score and PVR.  Zara Council, [facility] [address] [place], [place]  [address] [phone]

## 2016-06-15 ENCOUNTER — Other Ambulatory Visit: Payer: Medicare Other

## 2016-06-19 ENCOUNTER — Other Ambulatory Visit: Payer: Self-pay

## 2016-06-19 DIAGNOSIS — N400 Enlarged prostate without lower urinary tract symptoms: Secondary | ICD-10-CM

## 2016-06-20 ENCOUNTER — Other Ambulatory Visit: Payer: Medicare Other

## 2016-06-20 DIAGNOSIS — N400 Enlarged prostate without lower urinary tract symptoms: Secondary | ICD-10-CM

## 2016-06-21 LAB — PSA: PROSTATE SPECIFIC AG, SERUM: 1.2 ng/mL (ref 0.0–4.0)

## 2016-06-22 ENCOUNTER — Encounter: Payer: Self-pay | Admitting: Urology

## 2016-06-22 ENCOUNTER — Ambulatory Visit (INDEPENDENT_AMBULATORY_CARE_PROVIDER_SITE_OTHER): Payer: Medicare Other | Admitting: Urology

## 2016-06-22 VITALS — BP 143/66 | HR 70 | Ht 67.0 in | Wt 149.7 lb

## 2016-06-22 DIAGNOSIS — R339 Retention of urine, unspecified: Secondary | ICD-10-CM | POA: Diagnosis not present

## 2016-06-22 DIAGNOSIS — Z87898 Personal history of other specified conditions: Secondary | ICD-10-CM | POA: Diagnosis not present

## 2016-06-22 DIAGNOSIS — N138 Other obstructive and reflux uropathy: Secondary | ICD-10-CM

## 2016-06-22 DIAGNOSIS — N400 Enlarged prostate without lower urinary tract symptoms: Secondary | ICD-10-CM

## 2016-06-22 DIAGNOSIS — N401 Enlarged prostate with lower urinary tract symptoms: Secondary | ICD-10-CM

## 2016-06-22 LAB — BLADDER SCAN AMB NON-IMAGING: Scan Result: 0

## 2016-06-22 MED ORDER — TAMSULOSIN HCL 0.4 MG PO CAPS: 0.4000 mg | ORAL_CAPSULE | Freq: Every day | ORAL | 4 refills | Status: DC

## 2016-06-22 MED ORDER — FINASTERIDE 5 MG PO TABS: 5.0000 mg | ORAL_TABLET | Freq: Every day | ORAL | 4 refills | Status: DC

## 2016-06-22 NOTE — Progress Notes (Signed)
8:53 AM   KAO MIESSE 1941/07/24 [id number]  Referring provider: Tracie Harrier, MD [address] [facility] [place],  [address]  Chief Complaint  Patient presents with  . Benign Prostatic Hypertrophy    6 month follow up    HPI: [name] is a 75 year old Caucasian male with a history of elevated PSA, BPH with LUTS and a history of incomplete bladder emptying who presents today for his 6 months follow up.    ELEVATED PSA: Patient has a history of elevated PSA with reaching levels of 8.30 ng/mL in 2014. He subsequently underwent a prostate biopsy and no malignancy was found. He is currently on finasteride 5 mg daily and PSA's are drawn on a six-month basis.  His last PSA was 2.5 ng/mL on 12/19/2015.  He stopped his finasteride at that time, but he has since restarted the medication.  His current PSA is 1.2 ng/mL on 06/20/2016.    BPH WITH LUTS: His IPSS score today is 9, which is moderate lower urinary tract symptomatology. He is mostly satisfied with his quality life due to his urinary symptoms. His PVR is 0 mL.  His previous IPSS score was 15/3.  His previous PVR is 89 mL.  His major complaint today is waking up with damp underwear.  He is unsure if it is due to urine or sweat .  He has had these symptoms since his last visit.  He denies any dysuria, hematuria or suprapubic pain.  He currently taking Hytrin 5mg .  He was to discontinue the terazosin and start tamsulosin and finasteride.  His has had a biopsy in 2014.  No malignancy was found.  He also denies any recent fevers, chills, nausea or vomiting.  He does not have a family history of PCa.      IPSS    Row Name 06/22/16 0800         International Prostate Symptom Score   How often have you had the sensation of not emptying your bladder? Less than half the time     How often have you had to urinate less than every two hours? Less than half the time     How often have you found you stopped and  started again several times when you urinated? Less than half the time     How often have you found it difficult to postpone urination? Not at All     How often have you had a weak urinary stream? Less than half the time     How often have you had to strain to start urination? Not at All     How many times did you typically get up at night to urinate? 1 Time     Total IPSS Score 9       Quality of Life due to urinary symptoms   If you were to spend the rest of your life with your urinary condition just the way it is now how would you feel about that? Mostly Satisfied        Score:  1-7 Mild 8-19 Moderate 20-35 Severe    PMH: Past Medical History:  Diagnosis Date  . Anxiety   . BPH (benign prostatic hyperplasia)   . Carotid stenosis    bilateral  . CKD (chronic kidney disease)   . Elevated PSA   . HLD (hyperlipidemia)   . HTN (hypertension)   . Insomnia   . Weak urinary stream  Surgical History: Past Surgical History:  Procedure Laterality Date  . APPENDECTOMY    . carotid stenosis Left    ces and stent placement    Home Medications:    Medication List       Accurate as of 06/22/16  8:53 AM. Always use your most recent med list.          amLODipine 5 MG tablet Commonly known as:  NORVASC Take by mouth.   aspirin 325 MG tablet Take 325 mg by mouth daily.   citalopram 40 MG tablet Commonly known as:  CELEXA Take by mouth.   finasteride 5 MG tablet Commonly known as:  PROSCAR Take 1 tablet (5 mg total) by mouth daily.   hydrALAZINE 25 MG tablet Commonly known as:  APRESOLINE Take by mouth.   hydrochlorothiazide 25 MG tablet Commonly known as:  HYDRODIURIL Take by mouth.   lisinopril 20 MG tablet Commonly known as:  PRINIVIL,ZESTRIL Take 20 mg by mouth daily.   lovastatin 20 MG tablet Commonly known as:  MEVACOR Take 20 mg by mouth at bedtime.   sildenafil 100 MG tablet Commonly known as:  VIAGRA Take by mouth. Reported on 12/23/2015     sildenafil 100 MG tablet Commonly known as:  VIAGRA Take by mouth.   simvastatin 20 MG tablet Commonly known as:  ZOCOR Take 20 mg by mouth daily.   tamsulosin 0.4 MG Caps capsule Commonly known as:  FLOMAX Take 1 capsule (0.4 mg total) by mouth daily.       Allergies: No Known Allergies  Family History: Family History  Problem Relation Age of Onset  . CVA Father   . Diabetes Mellitus II Father   . Prostate cancer Neg Hx   . Kidney disease Neg Hx     Social History:  reports that he has quit smoking. He has quit using smokeless tobacco. He reports that he drinks alcohol. He reports that he does not use drugs.  ROS: UROLOGY Frequent Urination?: Yes Hard to postpone urination?: No Burning/pain with urination?: No Get up at night to urinate?: No Leakage of urine?: No Urine stream starts and stops?: No Trouble starting stream?: No Do you have to strain to urinate?: No Blood in urine?: No Urinary tract infection?: No Sexually transmitted disease?: No Injury to kidneys or bladder?: No Painful intercourse?: No Weak stream?: No Erection problems?: No Penile pain?: No  Gastrointestinal Nausea?: No Vomiting?: No Indigestion/heartburn?: No Diarrhea?: No Constipation?: No  Constitutional Fever: No Night sweats?: No Weight loss?: No Fatigue?: No  Skin Skin rash/lesions?: No Itching?: No  Eyes Blurred vision?: No Double vision?: No  Ears/Nose/Throat Sore throat?: No Sinus problems?: No  Hematologic/Lymphatic Swollen glands?: No Easy bruising?: No  Cardiovascular Leg swelling?: No Chest pain?: No  Respiratory Cough?: No Shortness of breath?: No  Endocrine Excessive thirst?: No  Musculoskeletal Back pain?: No Joint pain?: No  Neurological Headaches?: No Dizziness?: No  Psychologic Depression?: No Anxiety?: No  Physical Exam: BP (!) 143/66   Pulse 70   Ht 5\' 7"  (1.702 m)   Wt 149 lb 11.2 oz (67.9 kg)   BMI 23.45 kg/m   GU:  Patient with circumcised phallus.  Urethral meatus is patent.  No penile discharge. No penile lesions or rashes. Scrotum without lesions, cysts, rashes and/or edema.  Testicles are located scrotally bilaterally. No masses are appreciated in the testicles. Left and right epididymis are normal. Rectal: Patient with  normal sphincter tone. Perineum without scarring or rashes. No rectal masses  are appreciated. Prostate is approximately 55 grams, no nodules are appreciated. Seminal vesicles are normal.   Laboratory Data: Lab Results  Component Value Date   WBC 6.8 08/11/2013   HGB 12.6 (L) 08/11/2013   HCT 34.9 (L) 08/11/2013   MCV 95 08/11/2013   PLT 154 08/11/2013    Lab Results  Component Value Date   CREATININE 1.43 (H) 08/11/2013     PSA history:  4.1 ng/mL on 06/22/2014             4.2 ng/mL on 12/23/2014  1.7 ng/mL on 06/23/2015-stopped taking the finasteride  2.5 ng/mL on 12/19/2015  1.2 ng/mL on 06/20/2016  Pertinent Imaging: Results for orders placed or performed in visit on 06/22/16  BLADDER SCAN AMB NON-IMAGING  Result Value Ref Range   Scan Result 0     Assessment & Plan:    1. BPH with LUTS  - IPSS score is 9/2, it is improving  - Continue conservative management, avoiding bladder irritants and timed voiding's  - Continue tamsulosin 0.4 mg daily and finasteride 5 mg daily  - RTC in 12 months for IPSS and exam   2. Incomplete bladder emptying:   PVR is 0 mL.  RTC in one year for symptom recheck.    3. History of elevated PSA  - recent PSA is 1.2 ng/mL on 06/20/2016  - discussed with the patient that the [place] (2013) do not recommend routine PSA screening in men age 14+ years or any man with less than a 10 to 10 year life expectancy.  If the individual is in excellent health and after discussion it is decided to do a screening PSA, the threshold for biopsy should be raised to 10 ng/mL and if the PSA returns below 3 ng/mL, discontinue screening.    -  shared decision making; patient has decided not to continue PSA screening  - continue yearly DRE's   Return in about 1 year (around 06/22/2017) for IPSS and exam.  Zara Council, [facility]  [facility] [address], [place], [place] [address] [phone]

## 2016-10-19 ENCOUNTER — Other Ambulatory Visit (INDEPENDENT_AMBULATORY_CARE_PROVIDER_SITE_OTHER): Payer: Self-pay | Admitting: Vascular Surgery

## 2016-10-19 DIAGNOSIS — I6523 Occlusion and stenosis of bilateral carotid arteries: Secondary | ICD-10-CM

## 2016-10-23 ENCOUNTER — Encounter (INDEPENDENT_AMBULATORY_CARE_PROVIDER_SITE_OTHER): Payer: Self-pay | Admitting: Vascular Surgery

## 2016-10-23 ENCOUNTER — Ambulatory Visit (INDEPENDENT_AMBULATORY_CARE_PROVIDER_SITE_OTHER): Payer: Medicare Other | Admitting: Vascular Surgery

## 2016-10-23 ENCOUNTER — Ambulatory Visit (INDEPENDENT_AMBULATORY_CARE_PROVIDER_SITE_OTHER): Payer: Medicare Other

## 2016-10-23 VITALS — BP 175/71 | HR 65 | Resp 16 | Ht 67.0 in | Wt 150.0 lb

## 2016-10-23 DIAGNOSIS — I6523 Occlusion and stenosis of bilateral carotid arteries: Secondary | ICD-10-CM

## 2016-10-23 DIAGNOSIS — I10 Essential (primary) hypertension: Secondary | ICD-10-CM | POA: Diagnosis not present

## 2016-10-23 DIAGNOSIS — E785 Hyperlipidemia, unspecified: Secondary | ICD-10-CM | POA: Insufficient documentation

## 2016-10-23 NOTE — Progress Notes (Signed)
Subjective:    Patient ID: Xavier Davis, male    DOB: 12-Mar-1941, 75 y.o.   MRN: [id number] Chief Complaint  Patient presents with  . Carotid    Ultrasound follow up    Patient presents for a six month non-invasive study follow up for carotid stenosis. The stenosis has been followed by surveillance duplexes. The patient underwent a bilateral carotid duplex scan which showed no change from the previous exam on 04-20-16. Duplex is stable at Right ICA stenosis (40-59%) and a patent Left ICA stent. The patient denies experiencing Amaurosis Fugax, TIA like symptoms or focal motor deficits.     Review of Systems  Constitutional: Negative.   HENT: Negative.   Eyes: Negative.   Respiratory: Negative.   Cardiovascular: Negative.   Gastrointestinal: Negative.   Endocrine: Negative.   Genitourinary: Negative.   Musculoskeletal: Negative.   Skin: Negative.   Allergic/Immunologic: Negative.   Neurological: Negative.   Hematological: Negative.   Psychiatric/Behavioral: Negative.       Objective:   Physical Exam  Constitutional: He is oriented to person, place, and time. He appears well-developed and well-nourished.  HENT:  Head: Normocephalic and atraumatic.  Right Ear: External ear normal.  Left Ear: External ear normal.  Eyes: Conjunctivae are normal. Pupils are equal, round, and reactive to light.  Neck: Normal range of motion.  Cardiovascular: Normal rate, regular rhythm, normal heart sounds and intact distal pulses.   Pulses:      Radial pulses are 2+ on the right side, and 2+ on the left side.       Dorsalis pedis pulses are 2+ on the right side, and 2+ on the left side.       Posterior tibial pulses are 2+ on the right side, and 2+ on the left side.  Bilateral Carotid Bruit Noted  Pulmonary/Chest: Effort normal and breath sounds normal.  Abdominal: Soft. Bowel sounds are normal.  Musculoskeletal: Normal range of motion. He exhibits no edema.  Neurological: He is alert and  oriented to person, place, and time.  Skin: Skin is warm and dry.  Psychiatric: He has a normal mood and affect. His behavior is normal. Judgment and thought content normal.   BP (!) 175/71 (BP Location: Left Arm)   Pulse 65   Resp 16   Ht 5\' 7"  (1.702 m)   Wt 150 lb (68 kg)   BMI 23.49 kg/m   Past Medical History:  Diagnosis Date  . Anxiety   . BPH (benign prostatic hyperplasia)   . Carotid stenosis    bilateral  . CKD (chronic kidney disease)   . Elevated PSA   . HLD (hyperlipidemia)   . HTN (hypertension)   . Insomnia   . Weak urinary stream    Social History   Social History  . Marital status: Married    Spouse name: N/A  . Number of children: N/A  . Years of education: N/A   Occupational History  . Not on file.   Social History Main Topics  . Smoking status: Former Research scientist (life sciences)  . Smokeless tobacco: Former Systems developer     Comment: quit 40 years ago  . Alcohol use 0.0 oz/week     Comment: occasional  . Drug use: No  . Sexual activity: Not on file   Other Topics Concern  . Not on file   Social History Narrative  . No narrative on file   Past Surgical History:  Procedure Laterality Date  . APPENDECTOMY    .  carotid stenosis Left    ces and stent placement   Family History  Problem Relation Age of Onset  . CVA Father   . Diabetes Mellitus II Father   . Prostate cancer Neg Hx   . Kidney disease Neg Hx    No Known Allergies     Assessment & Plan:   Patient presents for a six month non-invasive study follow up for carotid stenosis. The stenosis has been followed by surveillance duplexes. The patient underwent a bilateral carotid duplex scan which showed no change from the previous exam on 04-20-16. Duplex is stable at Right ICA stenosis (40-59%) and a patent Left ICA stent. The patient denies experiencing Amaurosis Fugax, TIA like symptoms or focal motor deficits.   1. Bilateral carotid artery stenosis - Stable Studies reviewed with patient. Patient  asymptomatic with stable duplex. No intervention at this time. Patient to return in one year for surveillance carotid duplex. Patient to continue medical optimization with ASA and dyslipidemia medication. Patient to remain abstinent of tobacco use. I have discussed with the patient at length the risk factors for and pathogenesis of atherosclerotic disease and encouraged a healthy diet, regular exercise regimen and blood pressure / glucose control.  Patient was instructed to contact our office in the interim with problems such as arm / leg weakness or numbness, speech / swallowing difficulty or temporary monocular blindness. The patient expresses their understanding.  - VAS US CAROTID; Future  2. Essential hypertension - Stable Encouraged good control as its slows the progression of atherosclerotic disease  3. Hyperlipidemia, unspecified hyperlipidemia type - Stable Encouraged good control as its slows the progression of atherosclerotic disease  Current Outpatient Prescriptions on File Prior to Visit  Medication Sig Dispense Refill  . amLODipine (NORVASC) 5 MG tablet Take by mouth.    Marland Kitchen aspirin 325 MG tablet Take 325 mg by mouth daily.    . citalopram (CELEXA) 40 MG tablet Take by mouth.    . finasteride (PROSCAR) 5 MG tablet Take 1 tablet (5 mg total) by mouth daily. 90 tablet 4  . hydrALAZINE (APRESOLINE) 25 MG tablet Take by mouth.    . hydrochlorothiazide (HYDRODIURIL) 25 MG tablet Take by mouth.    Marland Kitchen lisinopril (PRINIVIL,ZESTRIL) 20 MG tablet Take 20 mg by mouth daily.    Marland Kitchen lovastatin (MEVACOR) 20 MG tablet Take 20 mg by mouth at bedtime.    . sildenafil (VIAGRA) 100 MG tablet Take by mouth.    . simvastatin (ZOCOR) 20 MG tablet Take 20 mg by mouth daily.    . tamsulosin (FLOMAX) 0.4 MG CAPS capsule Take 1 capsule (0.4 mg total) by mouth daily. 90 capsule 4  . sildenafil (VIAGRA) 100 MG tablet Take by mouth. Reported on 12/23/2015     No current facility-administered medications on  file prior to visit.     There are no Patient Instructions on file for this visit. No Follow-up on file.   Keondra Haydu A Bona Hubbard, PA-C

## 2017-06-23 NOTE — Progress Notes (Addendum)
9:14 AM   Xavier Davis [birth year] [id number]  Referring provider: Tracie Harrier, MD [address] [place] [facility] [place],  [address]  Chief Complaint  Patient presents with  . Benign Prostatic Hypertrophy    1 year follow up  . Elevated PSA    HPI: Xavier Davis is a 76 year old Caucasian male with a history of elevated PSA, BPH with LUTS and a history of incomplete bladder emptying who presents today for his 6 months follow up.    History of elevated PSA: Patient has a history of elevated PSA with reaching levels of 8.30 ng/mL in 2014. He subsequently underwent a prostate biopsy and no malignancy was found. He is currently on finasteride 5 mg daily and PSA's are drawn on a six-month basis.  His last PSA was 2.5 ng/mL on 12/19/2015.  He stopped his finasteride at that time, but he has since restarted the medication.  His most recent PSA was 1.2 ng/mL on 06/20/2016.    BPH WITH LUTS: His IPSS score today is 10, which is moderate lower urinary tract symptomatology.  He is pleased with his quality life due to his urinary symptoms.  His previous IPSS score was 9/2.  His previous PVR is 0 mL.  His major complaint today is waking up with damp underwear.  He is unsure if it is due to urine or sweat .  He has had these symptoms since his last visit.  He denies any dysuria, hematuria or suprapubic pain.  He has found the tamsulosin cost prohibitive.  He has not taken it in two days.  He is taking  finasteride.  His has had a biopsy in 2014.  No malignancy was found.  He also denies any recent fevers, chills, nausea or vomiting.  He does not have a family history of PCa.      IPSS    Row Name 06/24/17 0800         International Prostate Symptom Score   How often have you had the sensation of not emptying your bladder? Less than 1 in 5     How often have you had to urinate less than every two hours? Less than 1 in 5 times     How often have you found you stopped and  started again several times when you urinated? Less than half the time     How often have you found it difficult to postpone urination? Less than half the time     How often have you had a weak urinary stream? Less than half the time     How often have you had to strain to start urination? Less than 1 in 5 times     How many times did you typically get up at night to urinate? 1 Time     Total IPSS Score 10       Quality of Life due to urinary symptoms   If you were to spend the rest of your life with your urinary condition just the way it is now how would you feel about that? Pleased        Score:  1-7 Mild 8-19 Moderate 20-35 Severe    PMH: Past Medical History:  Diagnosis Date  . Anxiety   . BPH (benign prostatic hyperplasia)   . Carotid stenosis    bilateral  . CKD (chronic kidney disease)   . Elevated PSA   . HLD (hyperlipidemia)   . HTN (hypertension)   .  Insomnia   . Weak urinary stream     Surgical History: Past Surgical History:  Procedure Laterality Date  . APPENDECTOMY    . carotid stenosis Left    ces and stent placement    Home Medications:  Allergies as of 06/24/2017   No Known Allergies     Medication List       Accurate as of 06/24/17  9:14 AM. Always use your most recent med list.          amLODipine 5 MG tablet Commonly known as:  NORVASC Take by mouth.   aspirin 325 MG tablet Take 325 mg by mouth daily. Takes 1/2 a tablet   citalopram 40 MG tablet Commonly known as:  CELEXA Take by mouth.   finasteride 5 MG tablet Commonly known as:  PROSCAR Take 1 tablet (5 mg total) by mouth daily.   guaiFENesin-codeine 100-10 MG/5ML syrup Take by mouth.   hydrALAZINE 25 MG tablet Commonly known as:  APRESOLINE Take by mouth.   hydrochlorothiazide 25 MG tablet Commonly known as:  HYDRODIURIL Take by mouth.   lisinopril 20 MG tablet Commonly known as:  PRINIVIL,ZESTRIL Take 20 mg by mouth daily.   lovastatin 20 MG tablet Commonly  known as:  MEVACOR Take 20 mg by mouth at bedtime.   sildenafil 100 MG tablet Commonly known as:  VIAGRA Take by mouth. Reported on 12/23/2015   sildenafil 100 MG tablet Commonly known as:  VIAGRA Take by mouth.   simvastatin 20 MG tablet Commonly known as:  ZOCOR Take 20 mg by mouth daily.   tamsulosin 0.4 MG Caps capsule Commonly known as:  FLOMAX Take 1 capsule (0.4 mg total) by mouth daily.   traZODone 50 MG tablet Commonly known as:  DESYREL TAKE ONE TO THREE TABLETS BY MOUTH AT BEDTIME       Allergies: No Known Allergies  Family History: Family History  Problem Relation Age of Onset  . CVA Father   . Diabetes Mellitus II Father   . Prostate cancer Neg Hx   . Kidney disease Neg Hx   . Kidney cancer Neg Hx   . Bladder Cancer Neg Hx     Social History:  reports that he has quit smoking. He has quit using smokeless tobacco. He reports that he drinks alcohol. He reports that he does not use drugs.  ROS: UROLOGY Frequent Urination?: No Hard to postpone urination?: No Burning/pain with urination?: No Get up at night to urinate?: Yes Leakage of urine?: Yes Urine stream starts and stops?: No Trouble starting stream?: No Do you have to strain to urinate?: No Blood in urine?: No Urinary tract infection?: No Sexually transmitted disease?: No Injury to kidneys or bladder?: No Painful intercourse?: No Weak stream?: No Erection problems?: No Penile pain?: No  Gastrointestinal Nausea?: No Vomiting?: No Indigestion/heartburn?: No Diarrhea?: No Constipation?: No  Constitutional Fever: No Night sweats?: No Weight loss?: No Fatigue?: No  Skin Skin rash/lesions?: No Itching?: No  Eyes Blurred vision?: No Double vision?: No  Ears/Nose/Throat Sore throat?: No Sinus problems?: No  Hematologic/Lymphatic Swollen glands?: No Easy bruising?: No  Cardiovascular Leg swelling?: No Chest pain?: No  Respiratory Cough?: No Shortness of breath?:  No  Endocrine Excessive thirst?: No  Musculoskeletal Back pain?: No Joint pain?: No  Neurological Headaches?: No Dizziness?: No  Psychologic Depression?: No Anxiety?: No  Physical Exam: BP (!) 178/74   Pulse 79   Ht 5' 7.5" (1.715 m)   Wt 147 lb 8 oz (66.9  kg)   BMI 22.76 kg/m   GU: Patient with circumcised phallus.  Urethral meatus is patent.  No penile discharge. No penile lesions or rashes. Scrotum without lesions, cysts, rashes and/or edema.  Testicles are located scrotally bilaterally. No masses are appreciated in the testicles. Left and right epididymis are normal. Rectal: Patient with  normal sphincter tone. Perineum without scarring or rashes. No rectal masses are appreciated. Prostate is approximately 55 grams, no nodules are appreciated. Seminal vesicles are normal.   Laboratory Data:  PSA history:  4.1 ng/mL on 06/22/2014             4.2 ng/mL on 12/23/2014  1.7 ng/mL on 06/23/2015-stopped taking the finasteride  2.5 ng/mL on 12/19/2015  1.2 ng/mL on 06/20/2016   Assessment & Plan:    1. BPH with LUTS  - IPSS score is 10/1, it is worsening  - Continue conservative management, avoiding bladder irritants and timed voiding's  - Continue finasteride 5 mg daily  - patient is not going to take tamsulosin at this time and see if his symptoms worsen - I have given him a written script for the tamsulosin to see if he can find it more affordable at a local pharmacy  - RTC in 12 months for IPSS and exam   2. History of elevated PSA  - recent PSA is 1.2 ng/mL on 06/20/2016  - shared decision making; patient has decided not to continue PSA screening  - continue yearly DRE's   Return in about 1 year (around 06/24/2018) for I PSS and exam.  Zara Council, [facility]  [place] [address], [place], [place] [address] [phone]

## 2017-06-24 ENCOUNTER — Ambulatory Visit (INDEPENDENT_AMBULATORY_CARE_PROVIDER_SITE_OTHER): Payer: Medicare Other | Admitting: Urology

## 2017-06-24 ENCOUNTER — Encounter: Payer: Self-pay | Admitting: Urology

## 2017-06-24 VITALS — BP 178/74 | HR 79 | Ht 67.5 in | Wt 147.5 lb

## 2017-06-24 DIAGNOSIS — N401 Enlarged prostate with lower urinary tract symptoms: Secondary | ICD-10-CM | POA: Diagnosis not present

## 2017-06-24 DIAGNOSIS — Z87898 Personal history of other specified conditions: Secondary | ICD-10-CM

## 2017-06-24 DIAGNOSIS — N138 Other obstructive and reflux uropathy: Secondary | ICD-10-CM | POA: Diagnosis not present

## 2017-06-24 MED ORDER — TAMSULOSIN HCL 0.4 MG PO CAPS: 0.4000 mg | ORAL_CAPSULE | Freq: Every day | ORAL | 3 refills | Status: DC

## 2017-10-25 ENCOUNTER — Ambulatory Visit (INDEPENDENT_AMBULATORY_CARE_PROVIDER_SITE_OTHER): Payer: Medicare Other

## 2017-10-25 ENCOUNTER — Encounter (INDEPENDENT_AMBULATORY_CARE_PROVIDER_SITE_OTHER): Payer: Self-pay | Admitting: Vascular Surgery

## 2017-10-25 ENCOUNTER — Ambulatory Visit (INDEPENDENT_AMBULATORY_CARE_PROVIDER_SITE_OTHER): Payer: Medicare Other | Admitting: Vascular Surgery

## 2017-10-25 VITALS — BP 181/69 | HR 80 | Resp 14 | Ht 67.0 in | Wt 150.0 lb

## 2017-10-25 DIAGNOSIS — I10 Essential (primary) hypertension: Secondary | ICD-10-CM | POA: Diagnosis not present

## 2017-10-25 DIAGNOSIS — I6523 Occlusion and stenosis of bilateral carotid arteries: Secondary | ICD-10-CM

## 2017-10-25 DIAGNOSIS — E785 Hyperlipidemia, unspecified: Secondary | ICD-10-CM | POA: Diagnosis not present

## 2017-10-25 NOTE — Assessment & Plan Note (Signed)
lipid control important in reducing the progression of atherosclerotic disease. Continue statin therapy  

## 2017-10-25 NOTE — Assessment & Plan Note (Signed)
His duplex today shows a widely patent left carotid artery stent and stable stenosis in the right carotid artery 40-59% range without significant progression from his study one year ago.  He should continue his current medical regimen end of Zocor and aspirin daily.  We discussed doubling up on his Norvasc but contacting his primary care physician about this as well.  Recheck a carotid duplex in 1 year.

## 2017-10-25 NOTE — Progress Notes (Signed)
MRN : [id number]  Xavier Davis is a 76 y.o. (02-25-1941) male who presents with chief complaint of  Chief Complaint  Patient presents with  . Carotid    1 year carotid  .  History of Present Illness: Patient returns in follow-up of his carotid disease.  He is doing well today without obvious complaints.  He denies focal neurologic symptoms.  Specifically, no arm or leg weakness or numbness, no speech or swallowing difficulty, and no temporary monocular blindness.  His blood pressure is high today and he says it has been running high recently.  He takes 5 mg of amlodipine daily and has for many years.  He is about 4 years status post a left carotid artery stent placement for high-grade recurrent stenosis after an endarterectomy at the [place].  His duplex today shows a widely patent left carotid artery stent and stable stenosis in the right carotid artery 40-59% range without significant progression from his study one year ago.  Current Outpatient Medications  Medication Sig Dispense Refill  . amLODipine (NORVASC) 5 MG tablet Take by mouth.    Marland Kitchen aspirin 325 MG tablet Take 325 mg by mouth daily. Takes 1/2 a tablet    . citalopram (CELEXA) 40 MG tablet Take by mouth.    . finasteride (PROSCAR) 5 MG tablet Take 1 tablet (5 mg total) by mouth daily. 90 tablet 4  . hydrochlorothiazide (HYDRODIURIL) 25 MG tablet Take by mouth.    Marland Kitchen lisinopril (PRINIVIL,ZESTRIL) 20 MG tablet Take 20 mg by mouth daily.    Marland Kitchen lovastatin (MEVACOR) 20 MG tablet Take 20 mg by mouth at bedtime.    . sildenafil (VIAGRA) 100 MG tablet Take by mouth.    . simvastatin (ZOCOR) 20 MG tablet Take 20 mg by mouth daily.    . tamsulosin (FLOMAX) 0.4 MG CAPS capsule Take 1 capsule (0.4 mg total) by mouth daily. 90 capsule 3  . traZODone (DESYREL) 50 MG tablet TAKE ONE TO THREE TABLETS BY MOUTH AT BEDTIME    . guaiFENesin-codeine 100-10 MG/5ML syrup Take by mouth.    . hydrALAZINE (APRESOLINE) 25 MG tablet Take by mouth.    .  sildenafil (VIAGRA) 100 MG tablet Take by mouth. Reported on 12/23/2015     No current facility-administered medications for this visit.     Past Medical History:  Diagnosis Date  . Anxiety   . BPH (benign prostatic hyperplasia)   . Carotid stenosis    bilateral  . CKD (chronic kidney disease)   . Elevated PSA   . HLD (hyperlipidemia)   . HTN (hypertension)   . Insomnia   . Weak urinary stream     Past Surgical History:  Procedure Laterality Date  . APPENDECTOMY    . carotid stenosis Left    ces and stent placement    Social History Social History   Tobacco Use  . Smoking status: Former Research scientist (life sciences)  . Smokeless tobacco: Former Systems developer  . Tobacco comment: quit 40 years ago  Substance Use Topics  . Alcohol use: Yes    Alcohol/week: 0.0 oz    Comment: occasional  . Drug use: No     Family History Family History  Problem Relation Age of Onset  . CVA Father   . Diabetes Mellitus II Father   . Prostate cancer Neg Hx   . Kidney disease Neg Hx   . Kidney cancer Neg Hx   . Bladder Cancer Neg Hx      No Known  Allergies   REVIEW OF SYSTEMS (Negative unless checked)  Constitutional: [] Weight loss  [] Fever  [] Chills Cardiac: [] Chest pain   [] Chest pressure   [] Palpitations   [] Shortness of breath when laying flat   [] Shortness of breath at rest   [] Shortness of breath with exertion. Vascular:  [] Pain in legs with walking   [] Pain in legs at rest   [] Pain in legs when laying flat   [] Claudication   [] Pain in feet when walking  [] Pain in feet at rest  [] Pain in feet when laying flat   [] History of DVT   [] Phlebitis   [] Swelling in legs   [] Varicose veins   [] Non-healing ulcers Pulmonary:   [] Uses home oxygen   [] Productive cough   [] Hemoptysis   [] Wheeze  [] COPD   [] Asthma Neurologic:  [] Dizziness  [] Blackouts   [] Seizures   [] History of stroke   [] History of TIA  [] Aphasia   [] Temporary blindness   [] Dysphagia   [] Weakness or numbness in arms   [] Weakness or numbness in  legs Musculoskeletal:  [x] Arthritis   [] Joint swelling   [] Joint pain   [] Low back pain Hematologic:  [] Easy bruising  [] Easy bleeding   [] Hypercoagulable state   [] Anemic  [] Hepatitis Gastrointestinal:  [] Blood in stool   [] Vomiting blood  [] Gastroesophageal reflux/heartburn   [] Difficulty swallowing. Genitourinary:  [] Chronic kidney disease   [x] Difficult urination  [] Frequent urination  [] Burning with urination   [] Blood in urine Skin:  [] Rashes   [] Ulcers   [] Wounds Psychological:  [] History of anxiety   []  History of major depression.  Physical Examination  Vitals:   10/25/17 1121 10/25/17 1122  BP: (!) 187/80 (!) 181/69  Pulse: 80   Resp: 14   Weight: 68 kg (150 lb)   Height: 5\' 7"  (1.702 m)    Body mass index is 23.49 kg/m. Gen:  WD/WN, NAD Head: [place]/AT, No temporalis wasting. Ear/Nose/Throat: Hearing grossly intact, nares w/o erythema or drainage, trachea midline Eyes: Conjunctiva clear. Sclera non-icteric Neck: Supple.  No bruit or JVD.  Pulmonary:  Good air movement, equal and clear to auscultation bilaterally.  Cardiac: RRR, normal S1, S2 Vascular:  Vessel Right Left  Radial Palpable Palpable                                    Musculoskeletal: M/S 5/5 throughout.  No deformity or atrophy. no edema. Neurologic: CN 2-12 intact. Sensation grossly intact in extremities.  Symmetrical.  Speech is fluent. Motor exam as listed above. Psychiatric: Judgment intact, Mood & affect appropriate for pt's clinical situation. Dermatologic: No rashes or ulcers noted.  No cellulitis or open wounds.      CBC Lab Results  Component Value Date   WBC 6.8 08/11/2013   HGB 12.6 (L) 08/11/2013   HCT 34.9 (L) 08/11/2013   MCV 95 08/11/2013   PLT 154 08/11/2013    BMET    Component Value Date/Time   NA 139 08/11/2013 0406   K 4.2 08/11/2013 0406   CL 107 08/11/2013 0406   CO2 29 08/11/2013 0406   GLUCOSE 101 (H) 08/11/2013 0406   BUN 18 08/11/2013 0406   CREATININE  1.43 (H) 08/11/2013 0406   CALCIUM 8.7 08/11/2013 0406   GFRNONAA 49 (L) 08/11/2013 0406   GFRAA 56 (L) 08/11/2013 0406   CrCl cannot be calculated (Patient's most recent lab result is older than the maximum 21 days allowed.).  COAG Lab Results  Component Value Date   INR 1.1 08/11/2013    Radiology No results found.    Assessment/Plan Essential hypertension blood pressure control important in reducing the progression of atherosclerotic disease. On appropriate oral medications, but his control is suboptimal.  We discussed  potentially increasing the dose of his Norvasc from 5 mg to 10 mg daily.  He is also going to contact his primary care physician about this.  He is also already on lisinopril and HCTZ.  If his blood pressure continues to worsen, I would consider a renal artery duplex.   Hyperlipidemia lipid control important in reducing the progression of atherosclerotic disease. Continue statin therapy   Bilateral carotid artery stenosis His duplex today shows a widely patent left carotid artery stent and stable stenosis in the right carotid artery 40-59% range without significant progression from his study one year ago.  He should continue his current medical regimen end of Zocor and aspirin daily.  We discussed doubling up on his Norvasc but contacting his primary care physician about this as well.  Recheck a carotid duplex in 1 year.    Leotis Pain, MD  10/25/2017 12:11 PM    This note was created with Dragon medical transcription system.  Any errors from dictation are purely unintentional

## 2017-10-25 NOTE — Assessment & Plan Note (Addendum)
blood pressure control important in reducing the progression of atherosclerotic disease. On appropriate oral medications, but his control is suboptimal.  We discussed  potentially increasing the dose of his Norvasc from 5 mg to 10 mg daily.  He is also going to contact his primary care physician about this.  He is also already on lisinopril and HCTZ.  If his blood pressure continues to worsen, I would consider a renal artery duplex.

## 2017-10-25 NOTE — Patient Instructions (Signed)
Carotid Artery Disease The carotid arteries are arteries on both sides of the neck. They carry blood to the brain. Carotid artery disease is when the arteries get smaller (narrow) or get blocked. If these arteries get smaller or get blocked, you are more likely to have a stroke or warning stroke (transient ischemic attack). Follow these instructions at home:  Take medicines as told by your doctor. Make sure you understand all your medicine instructions. Do not stop your medicines without talking to your doctor first.  Follow your doctor's diet instructions. It is important to eat a healthy diet that includes plenty of: ? Fresh fruits. ? Vegetables. ? Lean meats.  Avoid: ? High-fat foods. ? High-sodium foods. ? Foods that are fried, overly processed, or have poor nutritional value.  Stay a healthy weight.  Stay active. Get at least 30 minutes of activity every day.  Do not smoke.  Limit alcohol use to: ? No more than 2 drinks a day for men. ? No more than 1 drink a day for women who are not pregnant.  Do not use illegal drugs.  Keep all doctor visits as told. Get help right away if:  You have sudden weakness or loss of feeling (numbness) on one side of the body, such as the face, arm, or leg.  You have sudden confusion.  You have trouble speaking (aphasia) or understanding.  You have sudden trouble seeing out of one or both eyes.  You have sudden trouble walking.  You have dizziness or feel like you might pass out (faint).  You have a loss of balance or your movements are not steady (uncoordinated).  You have a sudden, severe headache with no known cause.  You have trouble swallowing (dysphagia). Call your local emergency services (911 in [place] Do notdrive yourself to the clinic or hospital. This information is not intended to replace advice given to you by your health care provider. Make sure you discuss any questions you have with your health care  provider. Document Released: 10/08/2012 Document Revised: 03/29/2016 Document Reviewed: 04/22/2013 Elsevier Interactive Patient Education  2018 [employer]  

## 2018-04-16 ENCOUNTER — Inpatient Hospital Stay (HOSPITAL_COMMUNITY)
Admission: EM | Admit: 2018-04-16 | Discharge: 2018-04-25 | DRG: 964 | Disposition: A | Discharge status: Home Health | Payer: Medicare Other | Attending: General Surgery | Admitting: General Surgery

## 2018-04-16 ENCOUNTER — Emergency Department (HOSPITAL_COMMUNITY): Payer: Medicare Other

## 2018-04-16 ENCOUNTER — Encounter (HOSPITAL_COMMUNITY): Payer: Self-pay | Admitting: *Deleted

## 2018-04-16 ENCOUNTER — Other Ambulatory Visit: Payer: Self-pay

## 2018-04-16 DIAGNOSIS — S22009A Unspecified fracture of unspecified thoracic vertebra, initial encounter for closed fracture: Secondary | ICD-10-CM

## 2018-04-16 DIAGNOSIS — W11XXXA Fall on and from ladder, initial encounter: Secondary | ICD-10-CM | POA: Diagnosis present

## 2018-04-16 DIAGNOSIS — S36892A Contusion of other intra-abdominal organs, initial encounter: Secondary | ICD-10-CM | POA: Diagnosis present

## 2018-04-16 DIAGNOSIS — Z7982 Long term (current) use of aspirin: Secondary | ICD-10-CM

## 2018-04-16 DIAGNOSIS — S22039A Unspecified fracture of third thoracic vertebra, initial encounter for closed fracture: Secondary | ICD-10-CM

## 2018-04-16 DIAGNOSIS — R0902 Hypoxemia: Secondary | ICD-10-CM | POA: Diagnosis present

## 2018-04-16 DIAGNOSIS — I4510 Unspecified right bundle-branch block: Secondary | ICD-10-CM | POA: Diagnosis present

## 2018-04-16 DIAGNOSIS — R001 Bradycardia, unspecified: Secondary | ICD-10-CM | POA: Diagnosis present

## 2018-04-16 DIAGNOSIS — S22030A Wedge compression fracture of third thoracic vertebra, initial encounter for closed fracture: Secondary | ICD-10-CM | POA: Diagnosis present

## 2018-04-16 DIAGNOSIS — S27321A Contusion of lung, unilateral, initial encounter: Secondary | ICD-10-CM | POA: Diagnosis present

## 2018-04-16 DIAGNOSIS — R000 Tachycardia, unspecified: Secondary | ICD-10-CM | POA: Diagnosis not present

## 2018-04-16 DIAGNOSIS — I10 Essential (primary) hypertension: Secondary | ICD-10-CM | POA: Diagnosis present

## 2018-04-16 DIAGNOSIS — D62 Acute posthemorrhagic anemia: Secondary | ICD-10-CM | POA: Diagnosis not present

## 2018-04-16 DIAGNOSIS — Z87891 Personal history of nicotine dependence: Secondary | ICD-10-CM

## 2018-04-16 DIAGNOSIS — S271XXA Traumatic hemothorax, initial encounter: Principal | ICD-10-CM | POA: Diagnosis present

## 2018-04-16 DIAGNOSIS — R402362 Coma scale, best motor response, obeys commands, at arrival to emergency department: Secondary | ICD-10-CM | POA: Diagnosis present

## 2018-04-16 DIAGNOSIS — Y92007 Garden or yard of unspecified non-institutional (private) residence as the place of occurrence of the external cause: Secondary | ICD-10-CM | POA: Diagnosis not present

## 2018-04-16 DIAGNOSIS — J918 Pleural effusion in other conditions classified elsewhere: Secondary | ICD-10-CM | POA: Diagnosis present

## 2018-04-16 DIAGNOSIS — R402142 Coma scale, eyes open, spontaneous, at arrival to emergency department: Secondary | ICD-10-CM | POA: Diagnosis present

## 2018-04-16 DIAGNOSIS — S22079A Unspecified fracture of T9-T10 vertebra, initial encounter for closed fracture: Secondary | ICD-10-CM

## 2018-04-16 DIAGNOSIS — Y93H2 Activity, gardening and landscaping: Secondary | ICD-10-CM

## 2018-04-16 DIAGNOSIS — I4891 Unspecified atrial fibrillation: Secondary | ICD-10-CM | POA: Diagnosis not present

## 2018-04-16 DIAGNOSIS — E875 Hyperkalemia: Secondary | ICD-10-CM | POA: Diagnosis not present

## 2018-04-16 DIAGNOSIS — D696 Thrombocytopenia, unspecified: Secondary | ICD-10-CM | POA: Diagnosis not present

## 2018-04-16 DIAGNOSIS — Z9049 Acquired absence of other specified parts of digestive tract: Secondary | ICD-10-CM

## 2018-04-16 DIAGNOSIS — E785 Hyperlipidemia, unspecified: Secondary | ICD-10-CM | POA: Diagnosis present

## 2018-04-16 DIAGNOSIS — S2242XA Multiple fractures of ribs, left side, initial encounter for closed fracture: Secondary | ICD-10-CM | POA: Diagnosis present

## 2018-04-16 DIAGNOSIS — N289 Disorder of kidney and ureter, unspecified: Secondary | ICD-10-CM | POA: Diagnosis not present

## 2018-04-16 DIAGNOSIS — K661 Hemoperitoneum: Secondary | ICD-10-CM

## 2018-04-16 DIAGNOSIS — S22071A Stable burst fracture of T9-T10 vertebra, initial encounter for closed fracture: Secondary | ICD-10-CM | POA: Diagnosis present

## 2018-04-16 DIAGNOSIS — E871 Hypo-osmolality and hyponatremia: Secondary | ICD-10-CM | POA: Diagnosis not present

## 2018-04-16 DIAGNOSIS — I9589 Other hypotension: Secondary | ICD-10-CM | POA: Diagnosis present

## 2018-04-16 DIAGNOSIS — Z8709 Personal history of other diseases of the respiratory system: Secondary | ICD-10-CM

## 2018-04-16 DIAGNOSIS — Z938 Other artificial opening status: Secondary | ICD-10-CM

## 2018-04-16 DIAGNOSIS — R0602 Shortness of breath: Secondary | ICD-10-CM | POA: Diagnosis not present

## 2018-04-16 DIAGNOSIS — I340 Nonrheumatic mitral (valve) insufficiency: Secondary | ICD-10-CM | POA: Diagnosis not present

## 2018-04-16 DIAGNOSIS — S299XXA Unspecified injury of thorax, initial encounter: Secondary | ICD-10-CM

## 2018-04-16 DIAGNOSIS — J984 Other disorders of lung: Secondary | ICD-10-CM | POA: Diagnosis present

## 2018-04-16 DIAGNOSIS — R402252 Coma scale, best verbal response, oriented, at arrival to emergency department: Secondary | ICD-10-CM | POA: Diagnosis present

## 2018-04-16 DIAGNOSIS — N400 Enlarged prostate without lower urinary tract symptoms: Secondary | ICD-10-CM | POA: Diagnosis present

## 2018-04-16 DIAGNOSIS — J942 Hemothorax: Secondary | ICD-10-CM

## 2018-04-16 DIAGNOSIS — I480 Paroxysmal atrial fibrillation: Secondary | ICD-10-CM | POA: Diagnosis not present

## 2018-04-16 HISTORY — DX: Essential (primary) hypertension: I10

## 2018-04-16 HISTORY — DX: Hyperlipidemia, unspecified: E78.5

## 2018-04-16 HISTORY — DX: Disorder of arteries and arterioles, unspecified: I77.9

## 2018-04-16 HISTORY — DX: Peripheral vascular disease, unspecified: I73.9

## 2018-04-16 LAB — I-STAT CG4 LACTIC ACID, ED: Lactic Acid, Venous: 3.06 mmol/L (ref 0.5–1.9)

## 2018-04-16 LAB — URINALYSIS, ROUTINE W REFLEX MICROSCOPIC
Bilirubin Urine: NEGATIVE
Glucose, UA: NEGATIVE mg/dL
KETONES UR: NEGATIVE mg/dL
Leukocytes, UA: NEGATIVE
Nitrite: NEGATIVE
PH: 5 (ref 5.0–8.0)
Protein, ur: 30 mg/dL — AB
SPECIFIC GRAVITY, URINE: 1.018 (ref 1.005–1.030)

## 2018-04-16 LAB — COMPREHENSIVE METABOLIC PANEL
ALBUMIN: 3.8 g/dL (ref 3.5–5.0)
ALK PHOS: 49 U/L (ref 38–126)
ALT: 45 U/L (ref 17–63)
AST: 52 U/L — AB (ref 15–41)
Anion gap: 10 (ref 5–15)
BILIRUBIN TOTAL: 0.9 mg/dL (ref 0.3–1.2)
BUN: 42 mg/dL — AB (ref 6–20)
CALCIUM: 9.1 mg/dL (ref 8.9–10.3)
CO2: 23 mmol/L (ref 22–32)
CREATININE: 1.9 mg/dL — AB (ref 0.61–1.24)
Chloride: 105 mmol/L (ref 101–111)
GFR calc Af Amer: 38 mL/min — ABNORMAL LOW (ref >60)
GFR calc non Af Amer: 33 mL/min — ABNORMAL LOW (ref >60)
GLUCOSE: 178 mg/dL — AB (ref 65–99)
Potassium: 4.6 mmol/L (ref 3.5–5.1)
Sodium: 138 mmol/L (ref 135–145)
TOTAL PROTEIN: 6.1 g/dL — AB (ref 6.5–8.1)

## 2018-04-16 LAB — CBC
HCT: 34.7 % — ABNORMAL LOW (ref 39.0–52.0)
HCT: 31.6 % — ABNORMAL LOW (ref 39.0–52.0)
HEMOGLOBIN: 10.9 g/dL — AB (ref 13.0–17.0)
Hemoglobin: 12.1 g/dL — ABNORMAL LOW (ref 13.0–17.0)
MCH: 33.9 pg (ref 26.0–34.0)
MCH: 33.7 pg (ref 26.0–34.0)
MCHC: 34.9 g/dL (ref 30.0–36.0)
MCHC: 34.5 g/dL (ref 30.0–36.0)
MCV: 97.2 fL (ref 78.0–100.0)
MCV: 97.8 fL (ref 78.0–100.0)
PLATELETS: 166 10*3/uL (ref 150–400)
Platelets: 123 10*3/uL — ABNORMAL LOW (ref 150–400)
RBC: 3.57 MIL/uL — ABNORMAL LOW (ref 4.22–5.81)
RBC: 3.23 MIL/uL — ABNORMAL LOW (ref 4.22–5.81)
RDW: 11.7 % (ref 11.5–15.5)
RDW: 11.9 % (ref 11.5–15.5)
WBC: 10.8 10*3/uL — ABNORMAL HIGH (ref 4.0–10.5)
WBC: 13.8 10*3/uL — AB (ref 4.0–10.5)

## 2018-04-16 LAB — I-STAT CHEM 8, ED
BUN: 45 mg/dL — AB (ref 6–20)
CHLORIDE: 101 mmol/L (ref 101–111)
CREATININE: 1.9 mg/dL — AB (ref 0.61–1.24)
Calcium, Ion: 1.24 mmol/L (ref 1.15–1.40)
Glucose, Bld: 174 mg/dL — ABNORMAL HIGH (ref 65–99)
HEMATOCRIT: 34 % — AB (ref 39.0–52.0)
Hemoglobin: 11.6 g/dL — ABNORMAL LOW (ref 13.0–17.0)
Potassium: 4.2 mmol/L (ref 3.5–5.1)
SODIUM: 137 mmol/L (ref 135–145)
TCO2: 25 mmol/L (ref 22–32)

## 2018-04-16 LAB — PROTIME-INR
INR: 1.06
PROTHROMBIN TIME: 13.7 s (ref 11.4–15.2)

## 2018-04-16 LAB — SAMPLE TO BLOOD BANK

## 2018-04-16 LAB — ETHANOL

## 2018-04-16 LAB — LACTIC ACID, PLASMA: LACTIC ACID, VENOUS: 2.6 mmol/L — AB (ref 0.5–1.9)

## 2018-04-16 MED ORDER — FENTANYL CITRATE (PF) 100 MCG/2ML IJ SOLN
INTRAMUSCULAR | Status: AC
  Filled 2018-04-16: qty 2

## 2018-04-16 MED ORDER — TRAMADOL HCL 50 MG PO TABS
50.0000 mg | ORAL_TABLET | Freq: Four times a day (QID) | ORAL | Status: DC | PRN
  Administered 2018-04-17 – 2018-04-22 (×10): 50 mg via ORAL
  Filled 2018-04-16 (×10): qty 1

## 2018-04-16 MED ORDER — OXYCODONE HCL 5 MG PO TABS
5.0000 mg | ORAL_TABLET | ORAL | Status: DC | PRN
  Administered 2018-04-16 (×2): 10 mg via ORAL
  Administered 2018-04-21: 5 mg via ORAL
  Filled 2018-04-16 (×2): qty 2
  Filled 2018-04-16: qty 1
  Filled 2018-04-16: qty 2

## 2018-04-16 MED ORDER — PANTOPRAZOLE SODIUM 40 MG PO TBEC
40.0000 mg | DELAYED_RELEASE_TABLET | ORAL | Status: DC
  Administered 2018-04-16 – 2018-04-25 (×10): 40 mg via ORAL
  Filled 2018-04-16 (×10): qty 1

## 2018-04-16 MED ORDER — KCL IN DEXTROSE-NACL 20-5-0.45 MEQ/L-%-% IV SOLN
INTRAVENOUS | Status: DC
  Administered 2018-04-16: 16:00:00 via INTRAVENOUS
  Filled 2018-04-16 (×2): qty 1000

## 2018-04-16 MED ORDER — METOPROLOL TARTRATE 5 MG/5ML IV SOLN
5.0000 mg | Freq: Four times a day (QID) | INTRAVENOUS | Status: DC | PRN
  Administered 2018-04-21: 5 mg via INTRAVENOUS
  Filled 2018-04-16: qty 5

## 2018-04-16 MED ORDER — MORPHINE SULFATE (PF) 4 MG/ML IV SOLN
2.0000 mg | INTRAVENOUS | Status: DC | PRN
  Administered 2018-04-18 – 2018-04-19 (×3): 2 mg via INTRAVENOUS
  Administered 2018-04-21: 4 mg via INTRAVENOUS
  Filled 2018-04-16 (×3): qty 1

## 2018-04-16 MED ORDER — ALBUTEROL SULFATE (2.5 MG/3ML) 0.083% IN NEBU
2.5000 mg | INHALATION_SOLUTION | Freq: Four times a day (QID) | RESPIRATORY_TRACT | Status: DC | PRN
  Administered 2018-04-19 – 2018-04-23 (×2): 2.5 mg via RESPIRATORY_TRACT
  Filled 2018-04-16 (×2): qty 3

## 2018-04-16 MED ORDER — ONDANSETRON HCL 4 MG/2ML IJ SOLN: 4.0000 mg | Freq: Four times a day (QID) | INTRAMUSCULAR | Status: DC | PRN

## 2018-04-16 MED ORDER — ONDANSETRON 4 MG PO TBDP: 4.0000 mg | ORAL_TABLET | Freq: Four times a day (QID) | ORAL | Status: DC | PRN

## 2018-04-16 MED ORDER — ACETAMINOPHEN 500 MG PO TABS
1000.0000 mg | ORAL_TABLET | Freq: Three times a day (TID) | ORAL | Status: DC
  Administered 2018-04-16 – 2018-04-25 (×27): 1000 mg via ORAL
  Filled 2018-04-16 (×27): qty 2

## 2018-04-16 MED ORDER — METHOCARBAMOL 500 MG PO TABS
500.0000 mg | ORAL_TABLET | Freq: Three times a day (TID) | ORAL | Status: DC
  Administered 2018-04-16 – 2018-04-25 (×28): 500 mg via ORAL
  Filled 2018-04-16 (×29): qty 1

## 2018-04-16 MED ORDER — FENTANYL CITRATE (PF) 100 MCG/2ML IJ SOLN: 50.0000 ug | Freq: Once | INTRAMUSCULAR | Status: DC

## 2018-04-16 MED ORDER — FAMOTIDINE IN NACL 20-0.9 MG/50ML-% IV SOLN
20.0000 mg | INTRAVENOUS | Status: DC
  Administered 2018-04-16: 20 mg via INTRAVENOUS
  Filled 2018-04-16 (×2): qty 50

## 2018-04-16 MED ORDER — TAMSULOSIN HCL 0.4 MG PO CAPS
0.4000 mg | ORAL_CAPSULE | Freq: Every day | ORAL | Status: DC
  Administered 2018-04-16 – 2018-04-25 (×10): 0.4 mg via ORAL
  Filled 2018-04-16 (×10): qty 1

## 2018-04-16 MED ORDER — FINASTERIDE 5 MG PO TABS
5.0000 mg | ORAL_TABLET | Freq: Every day | ORAL | Status: DC
  Administered 2018-04-16 – 2018-04-24 (×9): 5 mg via ORAL
  Filled 2018-04-16 (×10): qty 1

## 2018-04-16 MED ORDER — TRAZODONE HCL 50 MG PO TABS
50.0000 mg | ORAL_TABLET | Freq: Every day | ORAL | Status: DC
  Administered 2018-04-16 – 2018-04-24 (×9): 50 mg via ORAL
  Filled 2018-04-16 (×9): qty 1

## 2018-04-16 MED ORDER — CITALOPRAM HYDROBROMIDE 40 MG PO TABS
40.0000 mg | ORAL_TABLET | Freq: Every day | ORAL | Status: DC
  Administered 2018-04-16 – 2018-04-24 (×9): 40 mg via ORAL
  Filled 2018-04-16 (×9): qty 1

## 2018-04-16 MED ORDER — MORPHINE SULFATE (PF) 4 MG/ML IV SOLN
4.0000 mg | Freq: Once | INTRAVENOUS | Status: AC
Start: 2018-04-16 — End: 2018-04-16
  Administered 2018-04-16: 4 mg via INTRAVENOUS
  Filled 2018-04-16: qty 1

## 2018-04-16 MED ORDER — DOCUSATE SODIUM 100 MG PO CAPS
100.0000 mg | ORAL_CAPSULE | Freq: Two times a day (BID) | ORAL | Status: DC
  Administered 2018-04-16 – 2018-04-25 (×17): 100 mg via ORAL
  Filled 2018-04-16 (×17): qty 1

## 2018-04-16 MED ORDER — FENTANYL CITRATE (PF) 100 MCG/2ML IJ SOLN
INTRAMUSCULAR | Status: AC | PRN
  Administered 2018-04-16: 50 ug via INTRAVENOUS

## 2018-04-16 NOTE — Consult Note (Signed)
Reason for Consult: T3 and T9 fractures Referring Physician: EDP  ATTICUS WEDIN is an 77 y.o. male.   HPI:  77 year old gentleman who fell about 20 feet off the ladder landing on his left side in the grass. Wayne's of rib pain. He has some back soreness. No leg pain or numbness tingling or weakness. CT scan showed a T3 compression fracture and a T9 superior endplate fracture and neurosurgical evaluation was requested. Patient has not walked or urinated since the injury.  Past Medical History:  Diagnosis Date  . Hypertension     Past Surgical History:  Procedure Laterality Date  . APPENDECTOMY      No Known Allergies  Social History   Tobacco Use  . Smoking status: Former Research scientist (life sciences)  . Smokeless tobacco: Never Used  Substance Use Topics  . Alcohol use: Never    Frequency: Never    History reviewed. No pertinent family history.   Review of Systems  Positive ROS: Negative  All other systems have been reviewed and were otherwise negative with the exception of those mentioned in the HPI and as above.  Objective: Vital signs in last 24 hours: Temp:  [97.4 F (36.3 C)] 97.4 F (36.3 C) (06/12 1143) Pulse Rate:  [50-66] 59 (06/12 1430) Resp:  [16-20] 17 (06/12 1430) BP: (89-135)/(45-55) 89/47 (06/12 1430) SpO2:  [92 %-100 %] 97 % (06/12 1430) Weight:  [68 kg (150 lb)] 68 kg (150 lb) (06/12 1209)  General Appearance: Alert, cooperative, no distress, appears stated age Head: Normocephalic, without obvious abnormality, atraumatic Eyes: PERRL, conjunctiva/corneas clear, EOM's intact    Throat: benign Neck: Supple Back: Symmetric, no curvature, ROM normal, no CVA tenderness Lungs:  respirations unlabored Heart: Regular rate and rhythm Abdomen: Soft Extremities: Extremities normal  NEUROLOGIC:   Mental status: A&O x4, no aphasia, good attention span, Memory and fund of knowledge Motor Exam - grossly normal, normal tone and bulk Sensory Exam - grossly normal Reflexes:  symmetric, no pathologic reflexes, No Hoffman's, No clonus Coordination - grossly normal Gait - grossly normal Balance - grossly normal Cranial Nerves: I: smell Not tested  II: visual acuity  OS: na    OD: na  II: visual fields Full to confrontation  II: pupils Equal, round, reactive to light  III,VII: ptosis None  III,IV,VI: extraocular muscles  Full ROM  V: mastication Normal  V: facial light touch sensation  Normal  V,VII: corneal reflex  Present  VII: facial muscle function - upper  Normal  VII: facial muscle function - lower Normal  VIII: hearing Not tested  IX: soft palate elevation  Normal  IX,X: gag reflex Present  XI: trapezius strength  5/5  XI: sternocleidomastoid strength 5/5  XI: neck flexion strength  5/5  XII: tongue strength  Normal    Data Review Lab Results  Component Value Date   WBC 10.8 (H) 04/16/2018   HGB 11.6 (L) 04/16/2018   HCT 34.0 (L) 04/16/2018   MCV 97.2 04/16/2018   PLT 166 04/16/2018   Lab Results  Component Value Date   NA 137 04/16/2018   K 4.2 04/16/2018   CL 101 04/16/2018   CO2 23 04/16/2018   BUN 45 (H) 04/16/2018   CREATININE 1.90 (H) 04/16/2018   GLUCOSE 174 (H) 04/16/2018   Lab Results  Component Value Date   INR 1.06 04/16/2018    Radiology: Ct Abdomen Pelvis Wo Contrast  Result Date: 04/16/2018 CLINICAL DATA:  77 year old male with history of trauma after falling 20 feet  off of a ladder landing onto his left side. EXAM: CT CHEST, ABDOMEN AND PELVIS WITHOUT CONTRAST TECHNIQUE: Multidetector CT imaging of the chest, abdomen and pelvis was performed following the standard protocol without IV contrast. COMPARISON:  No priors. FINDINGS: CT CHEST FINDINGS Cardiovascular: Heart size is normal. There is no significant pericardial fluid, thickening or pericardial calcification. There is aortic atherosclerosis, as well as atherosclerosis of the great vessels of the mediastinum and the coronary arteries, including calcified  atherosclerotic plaque in the left main, left anterior descending, left circumflex and right coronary arteries. Mediastinum/Nodes: Small amount of high attenuation in the posterior mediastinum adjacent to the T3 compression fracture (discussed below), compatible with a small amount of paravertebral hemorrhage. No other high attenuation fluid collection noted within the mediastinum to suggest posttraumatic hemorrhage. No pathologically enlarged mediastinal or hilar lymph nodes. Insert non-contrast hilar disclaimer esophagus is unremarkable in appearance. No axillary lymphadenopathy. Lungs/Pleura: Trace medial left pneumothorax best appreciated on axial images 18 and 19 of series 5. No right pneumothorax. Large amount of airspace consolidation in the left lower lobe, concerning for posttraumatic contusion. Several internal lucencies within this region likely reflect posttraumatic pneumatoceles. Minimal dependent atelectasis in the right lower lobe. Trace amount of high attenuation left pleural fluid, indicative of trace hemothorax. Subpleural thickening in the periphery of the left hemithorax, likely to reflect a small amount of subpleural hemorrhage from multiple left-sided rib fractures (discussed below). Musculoskeletal: Acute compression fracture of superior endplate of T3 with approximately 30% loss of anterior vertebral body height and 4 mm of retropulsion of fracture fragments impinging slightly upon the thoracic spinal canal. Small amount of paravertebral hemorrhage in the posterior mediastinum (discussed above). Acute mildly displaced fractures of the posterolateral aspect of the left sixth and seventh ribs. Several other nondisplaced left-sided rib fractures (anterolateral left third rib, anterolateral left fourth rib, anterolateral left fifth rib, anterolateral left sixth rib, lateral left seventh rib posterolateral left eighth rib, posterolateral left ninth rib, and posterolateral left tenth rib). Acute  nondisplaced fracture through superior endplate of T9. CT ABDOMEN PELVIS FINDINGS Hepatobiliary: No definite evidence of significant acute traumatic injury to the liver on today's noncontrast CT examination. No definite cystic or solid hepatic lesion. Unenhanced appearance of the gallbladder is normal. Pancreas: No definite evidence of acute traumatic injury to the pancreas on today's noncontrast CT examination. No definite pancreatic mass or peripancreatic fluid or inflammatory changes. Spleen: Unremarkable. Specifically, no definite signs of acute traumatic injury to the spleen. Adrenals/Urinary Tract: No definite evidence of acute traumatic injury to either kidney on today's noncontrast CT examination. Mild left renal atrophy. No hydroureteronephrosis. Unenhanced appearance of the urinary bladder is normal. Specifically, urinary bladder appears intact. Left adrenal gland is normal in appearance. Right adrenal gland is remarkable for a 2.2 x 1.7 cm low-attenuation (2 HU) nodule, compatible with an adenoma. Stomach/Bowel: Unenhanced appearance of the stomach is normal. No pathologic dilatation of small bowel or colon. However, there is extensive soft tissue stranding throughout the small bowel mesentery. In addition, there is a region of high attenuation in the root of the small bowel mesentery best appreciated on axial image 88 of series 4 and coronal image 33 of series 7, which is most compatible with mesenteric hemorrhage. Numerous colonic diverticulae are noted, without surrounding inflammatory changes to suggest an acute diverticulitis at this time. The appendix is not confidently identified and may be surgically absent. Regardless, there are no inflammatory changes noted adjacent to the cecum to suggest the presence of an  acute appendicitis at this time. Vascular/Lymphatic: Aortic atherosclerosis. No lymphadenopathy noted in the abdomen or pelvis. Reproductive: Prostate gland and seminal vesicles are  unremarkable in appearance. Other: Small amount of low-attenuation ascites in the low anatomic pelvis. No large high attenuation fluid collection in the retroperitoneum to suggest retroperitoneal hemorrhage. While there is no definite evidence of intraperitoneal hemorrhage, there does appear to be mesenteric hemorrhage, as above. Musculoskeletal: No acute displaced fractures or aggressive appearing lytic or blastic lesions are noted in the visualized portions of the skeleton. IMPRESSION: 1. Multiple displaced and nondisplaced left-sided rib fractures with posttraumatic contusion and pneumatocele is a in the left lower lobe, as well as a very small left hemopneumothorax (trace pneumothorax component and trace amount of pleural hemorrhage). 2. Acute compression fracture of superior endplate of T3 with 4 mm of retropulsion of fracture fragments slightly impinging upon the central spinal canal. There is also a nondisplaced fracture through superior endplate of T9 vertebral body. 3. Acute hemorrhage into the root of the small bowel mesentery, as discussed above. 4. Aortic atherosclerosis, in addition to left main and 3 vessel coronary artery disease. Assessment for potential risk factor modification, dietary therapy or pharmacologic therapy may be warranted, if clinically indicated. 5. Colonic diverticulosis without evidence of acute diverticulitis at this time. 6. Additional incidental findings, as above. These results were called by telephone at the time of interpretation on 04/16/2018 at 2:21 pm to Dr. Sherwood Gambler, who verbally acknowledged these results. Aortic Atherosclerosis (ICD10-I70.0). Electronically Signed   By: Vinnie Langton M.D.   On: 04/16/2018 14:21   Ct Head Wo Contrast  Result Date: 04/16/2018 CLINICAL DATA:  20 foot fall off a ladder. Denies head or neck pain. EXAM: CT HEAD WITHOUT CONTRAST CT CERVICAL SPINE WITHOUT CONTRAST TECHNIQUE: Multidetector CT imaging of the head and cervical spine was  performed following the standard protocol without intravenous contrast. Multiplanar CT image reconstructions of the cervical spine were also generated. COMPARISON:  None. FINDINGS: CT HEAD FINDINGS Brain: No evidence of acute infarction, hemorrhage, hydrocephalus, extra-axial collection or mass lesion/mass effect. Mild to moderate generalized cerebral atrophy. Vascular: Calcified atherosclerosis at the skullbase. No hyperdense vessel. Skull: Normal. Negative for fracture or focal lesion. Sinuses/Orbits: No acute finding. Other: None. CT CERVICAL SPINE FINDINGS Alignment: Normal. Skull base and vertebrae: No acute fracture. No primary bone lesion or focal pathologic process. Soft tissues and spinal canal: No prevertebral fluid or swelling. No visible canal hematoma. Disc levels: Mild-to-moderate disc height loss and facet uncovertebral hypertrophy throughout the cervical spine. Upper chest: Negative. Other: Nondisplaced fracture of the left posterior third rib at the costovertebral junction. Small foci of subcutaneous emphysema in the posterior left chest wall. Left carotid artery stent. Surgical clips in the left anterior neck. IMPRESSION: 1.  No acute intracranial abnormality. 2.  No acute cervical spine fracture. 3. Nondisplaced fracture of the left posterior third rib at the costovertebral junction. Small foci of subcutaneous emphysema in the posterior left chest wall. Please see separate CT chest report for further details. Electronically Signed   By: Titus Dubin M.D.   On: 04/16/2018 14:10   Ct Chest Wo Contrast  Result Date: 04/16/2018 CLINICAL DATA:  77 year old male with history of trauma after falling 20 feet off of a ladder landing onto his left side. EXAM: CT CHEST, ABDOMEN AND PELVIS WITHOUT CONTRAST TECHNIQUE: Multidetector CT imaging of the chest, abdomen and pelvis was performed following the standard protocol without IV contrast. COMPARISON:  No priors. FINDINGS: CT CHEST FINDINGS  Cardiovascular: Heart size is normal. There is no significant pericardial fluid, thickening or pericardial calcification. There is aortic atherosclerosis, as well as atherosclerosis of the great vessels of the mediastinum and the coronary arteries, including calcified atherosclerotic plaque in the left main, left anterior descending, left circumflex and right coronary arteries. Mediastinum/Nodes: Small amount of high attenuation in the posterior mediastinum adjacent to the T3 compression fracture (discussed below), compatible with a small amount of paravertebral hemorrhage. No other high attenuation fluid collection noted within the mediastinum to suggest posttraumatic hemorrhage. No pathologically enlarged mediastinal or hilar lymph nodes. Insert non-contrast hilar disclaimer esophagus is unremarkable in appearance. No axillary lymphadenopathy. Lungs/Pleura: Trace medial left pneumothorax best appreciated on axial images 18 and 19 of series 5. No right pneumothorax. Large amount of airspace consolidation in the left lower lobe, concerning for posttraumatic contusion. Several internal lucencies within this region likely reflect posttraumatic pneumatoceles. Minimal dependent atelectasis in the right lower lobe. Trace amount of high attenuation left pleural fluid, indicative of trace hemothorax. Subpleural thickening in the periphery of the left hemithorax, likely to reflect a small amount of subpleural hemorrhage from multiple left-sided rib fractures (discussed below). Musculoskeletal: Acute compression fracture of superior endplate of T3 with approximately 30% loss of anterior vertebral body height and 4 mm of retropulsion of fracture fragments impinging slightly upon the thoracic spinal canal. Small amount of paravertebral hemorrhage in the posterior mediastinum (discussed above). Acute mildly displaced fractures of the posterolateral aspect of the left sixth and seventh ribs. Several other nondisplaced left-sided  rib fractures (anterolateral left third rib, anterolateral left fourth rib, anterolateral left fifth rib, anterolateral left sixth rib, lateral left seventh rib posterolateral left eighth rib, posterolateral left ninth rib, and posterolateral left tenth rib). Acute nondisplaced fracture through superior endplate of T9. CT ABDOMEN PELVIS FINDINGS Hepatobiliary: No definite evidence of significant acute traumatic injury to the liver on today's noncontrast CT examination. No definite cystic or solid hepatic lesion. Unenhanced appearance of the gallbladder is normal. Pancreas: No definite evidence of acute traumatic injury to the pancreas on today's noncontrast CT examination. No definite pancreatic mass or peripancreatic fluid or inflammatory changes. Spleen: Unremarkable. Specifically, no definite signs of acute traumatic injury to the spleen. Adrenals/Urinary Tract: No definite evidence of acute traumatic injury to either kidney on today's noncontrast CT examination. Mild left renal atrophy. No hydroureteronephrosis. Unenhanced appearance of the urinary bladder is normal. Specifically, urinary bladder appears intact. Left adrenal gland is normal in appearance. Right adrenal gland is remarkable for a 2.2 x 1.7 cm low-attenuation (2 HU) nodule, compatible with an adenoma. Stomach/Bowel: Unenhanced appearance of the stomach is normal. No pathologic dilatation of small bowel or colon. However, there is extensive soft tissue stranding throughout the small bowel mesentery. In addition, there is a region of high attenuation in the root of the small bowel mesentery best appreciated on axial image 88 of series 4 and coronal image 33 of series 7, which is most compatible with mesenteric hemorrhage. Numerous colonic diverticulae are noted, without surrounding inflammatory changes to suggest an acute diverticulitis at this time. The appendix is not confidently identified and may be surgically absent. Regardless, there are no  inflammatory changes noted adjacent to the cecum to suggest the presence of an acute appendicitis at this time. Vascular/Lymphatic: Aortic atherosclerosis. No lymphadenopathy noted in the abdomen or pelvis. Reproductive: Prostate gland and seminal vesicles are unremarkable in appearance. Other: Small amount of low-attenuation ascites in the low anatomic pelvis. No large high attenuation fluid collection in  the retroperitoneum to suggest retroperitoneal hemorrhage. While there is no definite evidence of intraperitoneal hemorrhage, there does appear to be mesenteric hemorrhage, as above. Musculoskeletal: No acute displaced fractures or aggressive appearing lytic or blastic lesions are noted in the visualized portions of the skeleton. IMPRESSION: 1. Multiple displaced and nondisplaced left-sided rib fractures with posttraumatic contusion and pneumatocele is a in the left lower lobe, as well as a very small left hemopneumothorax (trace pneumothorax component and trace amount of pleural hemorrhage). 2. Acute compression fracture of superior endplate of T3 with 4 mm of retropulsion of fracture fragments slightly impinging upon the central spinal canal. There is also a nondisplaced fracture through superior endplate of T9 vertebral body. 3. Acute hemorrhage into the root of the small bowel mesentery, as discussed above. 4. Aortic atherosclerosis, in addition to left main and 3 vessel coronary artery disease. Assessment for potential risk factor modification, dietary therapy or pharmacologic therapy may be warranted, if clinically indicated. 5. Colonic diverticulosis without evidence of acute diverticulitis at this time. 6. Additional incidental findings, as above. These results were called by telephone at the time of interpretation on 04/16/2018 at 2:21 pm to Dr. Sherwood Gambler, who verbally acknowledged these results. Aortic Atherosclerosis (ICD10-I70.0). Electronically Signed   By: Vinnie Langton M.D.   On: 04/16/2018  14:21   Ct Cervical Spine Wo Contrast  Result Date: 04/16/2018 CLINICAL DATA:  20 foot fall off a ladder. Denies head or neck pain. EXAM: CT HEAD WITHOUT CONTRAST CT CERVICAL SPINE WITHOUT CONTRAST TECHNIQUE: Multidetector CT imaging of the head and cervical spine was performed following the standard protocol without intravenous contrast. Multiplanar CT image reconstructions of the cervical spine were also generated. COMPARISON:  None. FINDINGS: CT HEAD FINDINGS Brain: No evidence of acute infarction, hemorrhage, hydrocephalus, extra-axial collection or mass lesion/mass effect. Mild to moderate generalized cerebral atrophy. Vascular: Calcified atherosclerosis at the skullbase. No hyperdense vessel. Skull: Normal. Negative for fracture or focal lesion. Sinuses/Orbits: No acute finding. Other: None. CT CERVICAL SPINE FINDINGS Alignment: Normal. Skull base and vertebrae: No acute fracture. No primary bone lesion or focal pathologic process. Soft tissues and spinal canal: No prevertebral fluid or swelling. No visible canal hematoma. Disc levels: Mild-to-moderate disc height loss and facet uncovertebral hypertrophy throughout the cervical spine. Upper chest: Negative. Other: Nondisplaced fracture of the left posterior third rib at the costovertebral junction. Small foci of subcutaneous emphysema in the posterior left chest wall. Left carotid artery stent. Surgical clips in the left anterior neck. IMPRESSION: 1.  No acute intracranial abnormality. 2.  No acute cervical spine fracture. 3. Nondisplaced fracture of the left posterior third rib at the costovertebral junction. Small foci of subcutaneous emphysema in the posterior left chest wall. Please see separate CT chest report for further details. Electronically Signed   By: Titus Dubin M.D.   On: 04/16/2018 14:10   Dg Pelvis Portable  Result Date: 04/16/2018 CLINICAL DATA:  Trauma secondary to a fall from a ladder today. EXAM: PORTABLE PELVIS 1-2 VIEWS  COMPARISON:  None. FINDINGS: There is no evidence of pelvic fracture or diastasis. No pelvic bone lesions are seen. IMPRESSION: Negative. Electronically Signed   By: Lorriane Shire M.D.   On: 04/16/2018 12:10   Dg Chest Portable 1 View  Result Date: 04/16/2018 CLINICAL DATA:  Patient fell from a ladder. Diminished breath sounds. EXAM: PORTABLE CHEST 1 VIEW COMPARISON:  None. FINDINGS: There are multiple displaced left lateral rib fractures. There is no pneumothorax. Slight haziness in the left perihilar region.  Heart size and pulmonary vascularity are normal. Vague 8 6 mm density overlies the anterior aspect of the right third rib. This probably represents a confluence of normal structures but could represent a tiny pulmonary nodule. Aortic atherosclerosis. IMPRESSION: 1. Multiple displaced left lateral rib fractures without a discrete pneumothorax or pleural effusion. Fractures of the left third through ninth ribs. 2. Slight left perihilar haziness could represent lung contusion. 3. Possible small nodule in the right mid lung zone. 4.  Aortic Atherosclerosis (ICD10-I70.0). Electronically Signed   By: Lorriane Shire M.D.   On: 04/16/2018 11:59     Assessment/Plan: Estimated body mass index is 23.49 kg/m as calculated from the following:   Height as of this encounter: 5\' 7"  (1.702 m).   Weight as of this encounter: 68 kg (150 lb).   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 Therefore, I think the T9 fracture is slightly more unstable or has the slightly higher probability of becoming unstable. I would recommend a TLSO brace with a Minerva extension. Follow-up with me 2 weeks after discharge. I have had a long discussion with the family.   Oval Moralez S 04/16/2018 3:04 PM

## 2018-04-16 NOTE — ED Provider Notes (Signed)
[facility] EMERGENCY DEPARTMENT Provider Note   CSN: [id number] Arrival date & time: 04/16/18  1135   LEVEL 5 CAVEAT - ACUITY OF SITUATION  History   Chief Complaint Chief Complaint  Patient presents with  . Fall    HPI BRENDT DIBLE is a 77 y.o. male.  HPI  77 year old male presents after a fall off of a ladder. He presents as a level 2 trauma. He was approximately 12 feet above the ground.  He struck the ladder on the way down.  He hit his head and is not sure if he lost consciousness briefly.  He is having severe left-sided chest pain.  EMS reports that he was hypoxic initially which has responded to oxygen.  He had decreased breath sounds on the left side.  He does endorse a little bit of abdominal pain but most of the pain is in the chest and is severe.  No vomiting.  He denies being on blood thinners besides aspirin.  Past Medical History:  Diagnosis Date  . Hypertension     There are no active problems to display for this patient.   Past Surgical History:  Procedure Laterality Date  . APPENDECTOMY          Home Medications    Prior to Admission medications   Not on File    Family History No family history on file.  Social History Social History   Tobacco Use  . Smoking status: Former Research scientist (life sciences)  . Smokeless tobacco: Never Used  Substance Use Topics  . Alcohol use: Never    Frequency: Never  . Drug use: Never     Allergies   Patient has no allergy information on record.   Review of Systems Review of Systems  Unable to perform ROS: Acuity of condition     Physical Exam Updated Vital Signs BP (!) 135/55 (BP Location: Left Arm)   Pulse (!) 54   Temp (!) 97.4 F (36.3 C) (Oral)   Resp 20   Ht 5\' 7"  (1.702 m)   Wt 68 kg (150 lb)   SpO2 100%   BMI 23.49 kg/m   Physical Exam  Constitutional: He is oriented to person, place, and time. He appears well-developed and well-nourished. He appears distressed (in pain).  Cervical collar and face mask in place.  HENT:  Head: Normocephalic and atraumatic.  Right Ear: External ear normal.  Left Ear: External ear normal.  Nose: Nose normal.  Eyes: Pupils are equal, round, and reactive to light. EOM are normal. Right eye exhibits no discharge. Left eye exhibits no discharge.  Neck: Neck supple.  Cardiovascular: Normal rate, regular rhythm and normal heart sounds.  Pulmonary/Chest: Effort normal. He has decreased breath sounds in the left lower field. He exhibits tenderness and deformity.    Abdominal: Soft. There is generalized tenderness (mild, diffuse).  Musculoskeletal: He exhibits no edema.  Neurological: He is alert and oriented to person, place, and time.  Normal strength/sensation in all 4 extremities  Skin: Skin is warm and dry.  Nursing note and vitals reviewed.    ED Treatments / Results  Labs (all labs ordered are listed, but only abnormal results are displayed) Labs Reviewed  COMPREHENSIVE METABOLIC PANEL - Abnormal; Notable for the following components:      Result Value   Glucose, Bld 178 (*)    BUN 42 (*)    Creatinine, Ser 1.90 (*)    Total Protein 6.1 (*)    AST 52 (*)  GFR calc non Af Amer 33 (*)    GFR calc Af Amer 38 (*)    All other components within normal limits  CBC - Abnormal; Notable for the following components:   WBC 10.8 (*)    RBC 3.57 (*)    Hemoglobin 12.1 (*)    HCT 34.7 (*)    All other components within normal limits  I-STAT CHEM 8, ED - Abnormal; Notable for the following components:   BUN 45 (*)    Creatinine, Ser 1.90 (*)    Glucose, Bld 174 (*)    Hemoglobin 11.6 (*)    HCT 34.0 (*)    All other components within normal limits  I-STAT CG4 LACTIC ACID, ED - Abnormal; Notable for the following components:   Lactic Acid, Venous 3.06 (*)    All other components within normal limits  PROTIME-INR  CDS SEROLOGY  ETHANOL  URINALYSIS, ROUTINE W REFLEX MICROSCOPIC  SAMPLE TO BLOOD BANK    EKG EKG  Interpretation  Date/Time:  Wednesday April 16 2018 11:38:26 EDT Ventricular Rate:  48 PR Interval:    QRS Duration: 159 QT Interval:  438 QTC Calculation: 392 R Axis:   59 Text Interpretation:  Sinus bradycardia Ventricular premature complex Right bundle branch block ST depr, consider ischemia, anterolateral lds No old tracing to compare Confirmed by Sherwood Gambler [id number]) on 04/16/2018 11:58:58 AM   Radiology Dg Pelvis Portable  Result Date: 04/16/2018 CLINICAL DATA:  Trauma secondary to a fall from a ladder today. EXAM: PORTABLE PELVIS 1-2 VIEWS COMPARISON:  None. FINDINGS: There is no evidence of pelvic fracture or diastasis. No pelvic bone lesions are seen. IMPRESSION: Negative. Electronically Signed   By: Lorriane Shire M.D.   On: 04/16/2018 12:10   Dg Chest Portable 1 View  Result Date: 04/16/2018 CLINICAL DATA:  Patient fell from a ladder. Diminished breath sounds. EXAM: PORTABLE CHEST 1 VIEW COMPARISON:  None. FINDINGS: There are multiple displaced left lateral rib fractures. There is no pneumothorax. Slight haziness in the left perihilar region. Heart size and pulmonary vascularity are normal. Vague 8 6 mm density overlies the anterior aspect of the right third rib. This probably represents a confluence of normal structures but could represent a tiny pulmonary nodule. Aortic atherosclerosis. IMPRESSION: 1. Multiple displaced left lateral rib fractures without a discrete pneumothorax or pleural effusion. Fractures of the left third through ninth ribs. 2. Slight left perihilar haziness could represent lung contusion. 3. Possible small nodule in the right mid lung zone. 4.  Aortic Atherosclerosis (ICD10-I70.0). Electronically Signed   By: Lorriane Shire M.D.   On: 04/16/2018 11:59    Procedures .Critical Care Performed by: Sherwood Gambler, MD Authorized by: Sherwood Gambler, MD   Critical care provider statement:    Critical care time (minutes):  40   Critical care time was  exclusive of:  Separately billable procedures and treating other patients   Critical care was necessary to treat or prevent imminent or life-threatening deterioration of the following conditions:  Respiratory failure and trauma   Critical care was time spent personally by me on the following activities:  Development of treatment plan with patient or surrogate, discussions with consultants, evaluation of patient's response to treatment, examination of patient, ordering and performing treatments and interventions, ordering and review of laboratory studies, ordering and review of radiographic studies, pulse oximetry, re-evaluation of patient's condition, review of old charts and obtaining history from patient or surrogate   (including critical care time)  Medications Ordered in ED  Medications  fentaNYL (SUBLIMAZE) 100 MCG/2ML injection (has no administration in time range)  fentaNYL (SUBLIMAZE) 100 MCG/2ML injection (has no administration in time range)  dextrose 5 % and 0.45 % NaCl with KCl 20 mEq/L infusion (has no administration in time range)  acetaminophen (TYLENOL) tablet 1,000 mg (has no administration in time range)  oxyCODONE (Oxy IR/ROXICODONE) immediate release tablet 5-10 mg (has no administration in time range)  morphine 4 MG/ML injection 2-4 mg (has no administration in time range)  docusate sodium (COLACE) capsule 100 mg (has no administration in time range)  ondansetron (ZOFRAN-ODT) disintegrating tablet 4 mg (has no administration in time range)    Or  ondansetron (ZOFRAN) injection 4 mg (has no administration in time range)  pantoprazole (PROTONIX) EC tablet 40 mg (has no administration in time range)    Or  famotidine (PEPCID) IVPB 20 mg premix (has no administration in time range)  metoprolol tartrate (LOPRESSOR) injection 5 mg (has no administration in time range)  methocarbamol (ROBAXIN) tablet 500 mg (has no administration in time range)  traMADol (ULTRAM) tablet 50 mg (has  no administration in time range)  citalopram (CELEXA) tablet 40 mg (has no administration in time range)  finasteride (PROSCAR) tablet 5 mg (has no administration in time range)  tamsulosin (FLOMAX) capsule 0.4 mg (has no administration in time range)  traZODone (DESYREL) tablet 50 mg (has no administration in time range)  fentaNYL (SUBLIMAZE) injection (50 mcg Intravenous Given 04/16/18 1145)  morphine 4 MG/ML injection 4 mg (4 mg Intravenous Given 04/16/18 1243)     Initial Impression / Assessment and Plan / ED Course  I have reviewed the triage vital signs and the nursing notes.  Pertinent labs & imaging results that were available during my care of the patient were reviewed by me and considered in my medical decision making (see chart for details).     Patient presents with polytrauma after a fall.  He is noted to have decreased breath sounds on the left side but no obvious pneumothorax.  This appears to be from pulmonary contusion from multiple rib fractures.  He had a transient episode of hypotension.  Dr. Hulen Skains was at the bedside when this occurred and it seemed to be vagal from response to pain with mild bradycardia as well.  With better pain control he is improved.  He otherwise has had borderline blood pressures and was given small boluses of fluids as per trauma recommendations.  Found to have multiple injuries as listed including multiple rib fractures, pulmonary contusion, and mesenteric hemorrhage.  He will be admitted to the trauma service.  I discussed with neurosurgery, Dr. Ronnald Ramp about his vertebral body fractures in the thoracic spine.  He appears neurologically intact. Admit to Trauma.  Final Clinical Impressions(s) / ED Diagnoses   Final diagnoses:  Closed fracture of multiple ribs of left side, initial encounter  Contusion of left lung, initial encounter  Mesentery, hemorrhage  Closed fracture of third thoracic vertebra, unspecified fracture morphology, initial encounter  ([place])  Closed fracture of ninth thoracic vertebra, unspecified fracture morphology, initial encounter [facility])    ED Discharge Orders    None       Sherwood Gambler, MD 04/16/18 1549

## 2018-04-16 NOTE — ED Notes (Signed)
Communicated to RN critical Lab results.

## 2018-04-16 NOTE — ED Notes (Signed)
Pt states he is unable to urinate at this time.

## 2018-04-16 NOTE — Progress Notes (Signed)
Orthopedic Tech Progress Note Patient Details:  Xavier Davis 12/30/1940 [id number] [employer] completed brace order. Patient ID: Xavier Davis, male   DOB: October 23, 1941, 77 y.o.   MRN: [id number]   Xavier Davis 04/16/2018, 4:10 PM

## 2018-04-16 NOTE — Progress Notes (Signed)
   04/16/18 1300  Clinical Encounter Type  Visited With Patient and family together  Visit Type Spiritual support;Trauma  Referral From Nurse  Consult/Referral To Chaplain  Spiritual Encounters  Spiritual Needs Emotional  Stress Factors  Patient Stress Factors Health changes  Family Stress Factors Health changes  Chaplain was able to visit with the family in consult room A.  The family pastor was present with the family.  Chaplain provided support as doctor gave the family a briefing and update of what care will look like.

## 2018-04-16 NOTE — Progress Notes (Signed)
Orthopedic Tech Progress Note Patient Details:  MASSEY RUHLAND 1941-08-09 [id number] Called [employer] for brace. Patient ID: OTIS PORTAL, male   DOB: [birth year], 77 y.o.   MRN: [id number]   Braulio Bosch 04/16/2018, 3:25 PM

## 2018-04-16 NOTE — ED Notes (Signed)
Pt reports pain is better unless he moves  Getting ready to move him to  BED UPSTAIRS

## 2018-04-16 NOTE — ED Notes (Signed)
The pt reports that his pain is more of a soreness now

## 2018-04-16 NOTE — ED Notes (Signed)
The pt is still c/o pain  But his bp is low in the 90s.  Male is here to apply his back brace

## 2018-04-16 NOTE — ED Notes (Signed)
Brace brought in but not placed on the pt  He explained to the family how to apply

## 2018-04-16 NOTE — ED Notes (Signed)
The pts been seen by neuro but I have no idea when he was seen  The family  Reports that he talked to them.

## 2018-04-16 NOTE — Progress Notes (Signed)
Received patient to room 4NP09 via stretcher from the ER. Patient is oriented to the room and call system and hooked to cont monitor.

## 2018-04-16 NOTE — Progress Notes (Signed)
Orthopedic Tech Progress Note Patient Details:  Xavier Davis [birth year] [id number]  Patient ID: Xavier Davis, male   DOB: [birth year], 77 y.o.   MRN: [id number]   Xavier Davis 04/16/2018, 11:38 AM Made level 2 trauma visit

## 2018-04-16 NOTE — H&P (Signed)
[facility] Admission Note  Xavier Davis 1941/04/28  [id number].    Chief Complaint/Reason for Consult: level 2 trauma, fall from ladder HPI:  Patient is a 77 year old male who was brought in as a level 2 trauma after falling from a ladder earlier today. He was cutting a branch and it came back onto the ladder, fell about 20 ft and landed on top of ladder. Unsure whether he lost consciousness or not, but wife states he was sort of out of it and seemed to be gurgling. On my arrival patient complaining of pain in left chest and SOB. Denies dizziness, headache, blurred vision, diplopia, palpitations, nausea, numbness/tingling, focal weakness. Does report some mild abdominal pain and some mild back pain. Patient has PMH significant for HTN, HLD and BPH. No hx of COPD and quit smoking 20+ years ago but did have asthma as a child. Does not use inhalers. Takes 325 mg of ASA daily. NKDA.   ROS: Review of Systems  HENT: Negative for ear pain and tinnitus.   Eyes: Negative for blurred vision and double vision.  Respiratory: Positive for shortness of breath. Negative for wheezing.   Cardiovascular: Positive for chest pain. Negative for palpitations.  Gastrointestinal: Positive for abdominal pain. Negative for nausea and vomiting.  Musculoskeletal: Positive for back pain. Negative for joint pain and neck pain.  Neurological: Positive for loss of consciousness. Negative for tingling, sensory change, focal weakness and headaches.  All other systems reviewed and are negative.   No family history on file.  Past Medical History:  Diagnosis Date  . Hypertension     Past Surgical History:  Procedure Laterality Date  . APPENDECTOMY      Social History:  reports that he has quit smoking. He has never used smokeless tobacco. He reports that he does not drink alcohol or use drugs.  Allergies: Not on File   (Not in a hospital admission)  Blood pressure (!) 135/55, pulse (!) 54,  temperature (!) 97.4 F (36.3 C), temperature source Oral, resp. rate 20, height '5\' 7"'$  (1.702 m), weight 68 kg (150 lb), SpO2 100 %. Physical Exam: Physical Exam  Constitutional: He is oriented to person, place, and time.  HENT:  Head: Normocephalic and atraumatic.  Right Ear: External ear normal.  Left Ear: External ear normal.  Nose: Nose normal.  Mouth/Throat: Oropharynx is clear and moist and mucous membranes are normal.  Eyes: Pupils are equal, round, and reactive to light. Conjunctivae, EOM and lids are normal. No scleral icterus.  Neck: Normal range of motion and phonation normal. Neck supple. No spinous process tenderness and no muscular tenderness present.  Cardiovascular: Regular rhythm, S1 normal and S2 normal. Bradycardia present.  Pulses:      Radial pulses are 2+ on the right side, and 2+ on the left side.       Dorsalis pedis pulses are 2+ on the right side, and 2+ on the left side.  Pulmonary/Chest: No accessory muscle usage or stridor. No tachypnea and no bradypnea. He has no decreased breath sounds. He has no wheezes. He has rales in the left lower field. He exhibits tenderness (left chest). He exhibits no laceration, no crepitus and no retraction.  Abdominal: Soft. Bowel sounds are normal. He exhibits no distension. There is no hepatosplenomegaly. There is tenderness in the left upper quadrant and left lower quadrant. There is no rigidity, no rebound and no guarding. No hernia.  Musculoskeletal:  ROM grossly intact in bilateral upper and lower  extremities. No gross deformities of bilateral upper and lower extremities.  Neurological: He is alert and oriented to person, place, and time. He has normal strength. No sensory deficit. GCS eye subscore is 4. GCS verbal subscore is 5. GCS motor subscore is 6.  Skin: Skin is warm and dry. Abrasion (upper abdomen) noted. He is not diaphoretic. No pallor.  Psychiatric: He has a normal mood and affect. His speech is normal and behavior  is normal.    Results for orders placed or performed during the hospital encounter of 04/16/18 (from the past 48 hour(s))  Comprehensive metabolic panel     Status: Abnormal   Collection Time: 04/16/18 11:40 AM  Result Value Ref Range   Sodium 138 135 - 145 mmol/L   Potassium 4.6 3.5 - 5.1 mmol/L   Chloride 105 101 - 111 mmol/L   CO2 23 22 - 32 mmol/L   Glucose, Bld 178 (H) 65 - 99 mg/dL   BUN 42 (H) 6 - 20 mg/dL   Creatinine, Ser 1.90 (H) 0.61 - 1.24 mg/dL   Calcium 9.1 8.9 - 10.3 mg/dL   Total Protein 6.1 (L) 6.5 - 8.1 g/dL   Albumin 3.8 3.5 - 5.0 g/dL   AST 52 (H) 15 - 41 U/L   ALT 45 17 - 63 U/L   Alkaline Phosphatase 49 38 - 126 U/L   Total Bilirubin 0.9 0.3 - 1.2 mg/dL   GFR calc non Af Amer 33 (L) >60 mL/min   GFR calc Af Amer 38 (L) >60 mL/min    Comment: (NOTE) The eGFR has been calculated using the CKD EPI equation. This calculation has not been validated in all clinical situations. eGFR's persistently <60 mL/min signify possible Chronic Kidney Disease.    Anion gap 10 5 - 15    Comment: Performed at [facility] [address] [place], [place] [address]  CBC     Status: Abnormal   Collection Time: 04/16/18 11:40 AM  Result Value Ref Range   WBC 10.8 (H) 4.0 - 10.5 K/uL   RBC 3.57 (L) 4.22 - 5.81 MIL/uL   Hemoglobin 12.1 (L) 13.0 - 17.0 g/dL   HCT 34.7 (L) 39.0 - 52.0 %   MCV 97.2 78.0 - 100.0 fL   MCH 33.9 26.0 - 34.0 pg   MCHC 34.9 30.0 - 36.0 g/dL   RDW 11.7 11.5 - 15.5 %   Platelets 166 150 - 400 K/uL    Comment: Performed at [facility], [place] [address] [place], [place] [address]  Protime-INR     Status: None   Collection Time: 04/16/18 11:40 AM  Result Value Ref Range   Prothrombin Time 13.7 11.4 - 15.2 seconds   INR 1.06     Comment: Performed at [facility], [place] [address], [place], [place] [address]  Sample to Blood Bank     Status: None   Collection Time: 04/16/18 11:40 AM  Result Value Ref Range   Blood Bank Specimen  SAMPLE AVAILABLE FOR TESTING    Sample Expiration      04/17/2018 Performed at [facility], [place] [address] [place], [place] [address]   I-Stat Chem 8, ED     Status: Abnormal   Collection Time: 04/16/18 12:28 PM  Result Value Ref Range   Sodium 137 135 - 145 mmol/L   Potassium 4.2 3.5 - 5.1 mmol/L   Chloride 101 101 - 111 mmol/L   BUN 45 (H) 6 -  20 mg/dL   Creatinine, Ser 1.90 (H) 0.61 - 1.24 mg/dL   Glucose, Bld 174 (H) 65 - 99 mg/dL   Calcium, Ion 1.24 1.15 - 1.40 mmol/L   TCO2 25 22 - 32 mmol/L   Hemoglobin 11.6 (L) 13.0 - 17.0 g/dL   HCT 34.0 (L) 39.0 - 52.0 %  I-Stat CG4 Lactic Acid, ED     Status: Abnormal   Collection Time: 04/16/18 12:28 PM  Result Value Ref Range   Lactic Acid, Venous 3.06 (HH) 0.5 - 1.9 mmol/L   Comment NOTIFIED PHYSICIAN    Dg Pelvis Portable  Result Date: 04/16/2018 CLINICAL DATA:  Trauma secondary to a fall from a ladder today. EXAM: PORTABLE PELVIS 1-2 VIEWS COMPARISON:  None. FINDINGS: There is no evidence of pelvic fracture or diastasis. No pelvic bone lesions are seen. IMPRESSION: Negative. Electronically Signed   By: Lorriane Shire M.D.   On: 04/16/2018 12:10   Dg Chest Portable 1 View  Result Date: 04/16/2018 CLINICAL DATA:  Patient fell from a ladder. Diminished breath sounds. EXAM: PORTABLE CHEST 1 VIEW COMPARISON:  None. FINDINGS: There are multiple displaced left lateral rib fractures. There is no pneumothorax. Slight haziness in the left perihilar region. Heart size and pulmonary vascularity are normal. Vague 8 6 mm density overlies the anterior aspect of the right third rib. This probably represents a confluence of normal structures but could represent a tiny pulmonary nodule. Aortic atherosclerosis. IMPRESSION: 1. Multiple displaced left lateral rib fractures without a discrete pneumothorax or pleural effusion. Fractures of the left third through ninth ribs. 2. Slight left perihilar haziness could represent lung contusion. 3. Possible  small nodule in the right mid lung zone. 4.  Aortic Atherosclerosis (ICD10-I70.0). Electronically Signed   By: Lorriane Shire M.D.   On: 04/16/2018 11:59      Assessment/Plan Fall from ladder Left rib fractures with pulmonary contusion and small hemothorax, pneumatoceles - multimodal pain control, IS, pulm toilet - PT/OT - repeat CXR in AM Mesenteric hemorrhage with soft tissue stranding - abdominal exam with some TTP on left side but nothing to suggest acute surgical issue - repeat lactate and cbc 1800 - serial abdominal exams - CLD T3 endplate compression fracture with 37m of retropulsion/Non-displaced T9 endplate fracture - appreciate NS consult. TLSO brace, PT/OT, pain control  HTN - hold meds in setting of hypotension, prn lopressor  HLD - hold home meds for now, will restart when tolerating PO diet BPH - will reorder home meds  FEN: CLD, IVF VTE: SCDs ID: no abx indicated currently   Admit to SDU. Monitor abdominal exams and respiratory status. Brace per NS.   KBrigid Re [facility] 04/16/2018, 1:35 PM Pager: [phone] Mon-Fri 7:00 am-4:30 pm Sat-Sun 7:00 am-11:30 am

## 2018-04-16 NOTE — ED Triage Notes (Signed)
Patient was on a ladder metal  Cutting a branch and the branch came back on the ladder  He fell approx. 20 feet landing on the ladder. Abrasion across his anterior chest . C/o severe pain in his right lateral rib area . Hurts worse with inspiration

## 2018-04-16 NOTE — ED Notes (Signed)
Fentanyl 180mcg wastedwith Renee Rival, RN- [facility] instructor) in garbage

## 2018-04-17 ENCOUNTER — Inpatient Hospital Stay (HOSPITAL_COMMUNITY): Payer: Medicare Other

## 2018-04-17 LAB — BASIC METABOLIC PANEL
ANION GAP: 8 (ref 5–15)
BUN: 36 mg/dL — ABNORMAL HIGH (ref 6–20)
CALCIUM: 8.1 mg/dL — AB (ref 8.9–10.3)
CO2: 22 mmol/L (ref 22–32)
CREATININE: 1.77 mg/dL — AB (ref 0.61–1.24)
Chloride: 106 mmol/L (ref 101–111)
GFR calc Af Amer: 41 mL/min — ABNORMAL LOW (ref >60)
GFR calc non Af Amer: 36 mL/min — ABNORMAL LOW (ref >60)
GLUCOSE: 226 mg/dL — AB (ref 65–99)
Potassium: 5.1 mmol/L (ref 3.5–5.1)
Sodium: 136 mmol/L (ref 135–145)

## 2018-04-17 LAB — CBC
HCT: 27.5 % — ABNORMAL LOW (ref 39.0–52.0)
Hemoglobin: 9.4 g/dL — ABNORMAL LOW (ref 13.0–17.0)
MCH: 33.2 pg (ref 26.0–34.0)
MCHC: 34.2 g/dL (ref 30.0–36.0)
MCV: 97.2 fL (ref 78.0–100.0)
Platelets: 108 10*3/uL — ABNORMAL LOW (ref 150–400)
RBC: 2.83 MIL/uL — ABNORMAL LOW (ref 4.22–5.81)
RDW: 11.9 % (ref 11.5–15.5)
WBC: 9 10*3/uL (ref 4.0–10.5)

## 2018-04-17 LAB — CDS SEROLOGY

## 2018-04-17 LAB — MRSA PCR SCREENING: MRSA by PCR: NEGATIVE

## 2018-04-17 MED ORDER — DEXTROSE-NACL 5-0.45 % IV SOLN
INTRAVENOUS | Status: DC
  Administered 2018-04-17 (×2): via INTRAVENOUS

## 2018-04-17 NOTE — Progress Notes (Signed)
Applied aspen collar when received and patient OOB to chair with TLSO.  Patient tolerating both well, although needing to be placed back on 4L [place] from 2L [place] as O2 sat dropped from 94% to 88% with ambulation.  Will continue to monitor.

## 2018-04-17 NOTE — Progress Notes (Signed)
C/o of rib soreness, no leg pain or NTW, moves legs well, has TLSO but no cervical extension, will get ASPEN C-collar

## 2018-04-17 NOTE — Progress Notes (Signed)
Trauma Service Note  Subjective: Patient says it hurts mostly when he tries to move  Objective: Vital signs in last 24 hours: Temp:  [97.4 F (36.3 C)-98.8 F (37.1 C)] 98.5 F (36.9 C) (06/13 0300) Pulse Rate:  [50-79] 79 (06/13 0255) Resp:  [11-22] 20 (06/13 0255) BP: (89-156)/(45-72) 156/68 (06/13 0255) SpO2:  [92 %-100 %] 96 % (06/13 0255) Weight:  [68 kg (150 lb)-70.4 kg (155 lb 3.3 oz)] 70.4 kg (155 lb 3.3 oz) (06/12 1916) Last BM Date: 04/16/18  Intake/Output from previous day: 06/12 0701 - 06/13 0700 In: 2822.9 [I.V.:2822.9] Out: 550 [Urine:550] Intake/Output this shift: No intake/output data recorded.  General: Pain with movement.  Lungs: Clear bilaterally, CXR shows increased left hemothorax.  No PTX.  Abd: Soft, distended, good bowel sounds.  Not tender  Extremities: No changes.  No clinical signs or symptoms of DVT  Neuro: Intact  Lab Results: CBC  Recent Labs    04/16/18 2001 04/17/18 0336  WBC 13.8* 9.0  HGB 10.9* 9.4*  HCT 31.6* 27.5*  PLT 123* 108*   BMET Recent Labs    04/16/18 1140 04/16/18 1228 04/17/18 0336  NA 138 137 136  K 4.6 4.2 5.1  CL 105 101 106  CO2 23  --  22  GLUCOSE 178* 174* 226*  BUN 42* 45* 36*  CREATININE 1.90* 1.90* 1.77*  CALCIUM 9.1  --  8.1*   PT/INR Recent Labs    04/16/18 1140  LABPROT 13.7  INR 1.06   ABG No results for input(s): PHART, HCO3 in the last 72 hours.  Invalid input(s): PCO2, PO2  Studies/Results: Ct Abdomen Pelvis Wo Contrast  Result Date: 04/16/2018 CLINICAL DATA:  77 year old male with history of trauma after falling 20 feet off of a ladder landing onto his left side. EXAM: CT CHEST, ABDOMEN AND PELVIS WITHOUT CONTRAST TECHNIQUE: Multidetector CT imaging of the chest, abdomen and pelvis was performed following the standard protocol without IV contrast. COMPARISON:  No priors. FINDINGS: CT CHEST FINDINGS Cardiovascular: Heart size is normal. There is no significant pericardial fluid,  thickening or pericardial calcification. There is aortic atherosclerosis, as well as atherosclerosis of the great vessels of the mediastinum and the coronary arteries, including calcified atherosclerotic plaque in the left main, left anterior descending, left circumflex and right coronary arteries. Mediastinum/Nodes: Small amount of high attenuation in the posterior mediastinum adjacent to the T3 compression fracture (discussed below), compatible with a small amount of paravertebral hemorrhage. No other high attenuation fluid collection noted within the mediastinum to suggest posttraumatic hemorrhage. No pathologically enlarged mediastinal or hilar lymph nodes. Insert non-contrast hilar disclaimer esophagus is unremarkable in appearance. No axillary lymphadenopathy. Lungs/Pleura: Trace medial left pneumothorax best appreciated on axial images 18 and 19 of series 5. No right pneumothorax. Large amount of airspace consolidation in the left lower lobe, concerning for posttraumatic contusion. Several internal lucencies within this region likely reflect posttraumatic pneumatoceles. Minimal dependent atelectasis in the right lower lobe. Trace amount of high attenuation left pleural fluid, indicative of trace hemothorax. Subpleural thickening in the periphery of the left hemithorax, likely to reflect a small amount of subpleural hemorrhage from multiple left-sided rib fractures (discussed below). Musculoskeletal: Acute compression fracture of superior endplate of T3 with approximately 30% loss of anterior vertebral body height and 4 mm of retropulsion of fracture fragments impinging slightly upon the thoracic spinal canal. Small amount of paravertebral hemorrhage in the posterior mediastinum (discussed above). Acute mildly displaced fractures of the posterolateral aspect of the left sixth  and seventh ribs. Several other nondisplaced left-sided rib fractures (anterolateral left third rib, anterolateral left fourth rib,  anterolateral left fifth rib, anterolateral left sixth rib, lateral left seventh rib posterolateral left eighth rib, posterolateral left ninth rib, and posterolateral left tenth rib). Acute nondisplaced fracture through superior endplate of T9. CT ABDOMEN PELVIS FINDINGS Hepatobiliary: No definite evidence of significant acute traumatic injury to the liver on today's noncontrast CT examination. No definite cystic or solid hepatic lesion. Unenhanced appearance of the gallbladder is normal. Pancreas: No definite evidence of acute traumatic injury to the pancreas on today's noncontrast CT examination. No definite pancreatic mass or peripancreatic fluid or inflammatory changes. Spleen: Unremarkable. Specifically, no definite signs of acute traumatic injury to the spleen. Adrenals/Urinary Tract: No definite evidence of acute traumatic injury to either kidney on today's noncontrast CT examination. Mild left renal atrophy. No hydroureteronephrosis. Unenhanced appearance of the urinary bladder is normal. Specifically, urinary bladder appears intact. Left adrenal gland is normal in appearance. Right adrenal gland is remarkable for a 2.2 x 1.7 cm low-attenuation (2 HU) nodule, compatible with an adenoma. Stomach/Bowel: Unenhanced appearance of the stomach is normal. No pathologic dilatation of small bowel or colon. However, there is extensive soft tissue stranding throughout the small bowel mesentery. In addition, there is a region of high attenuation in the root of the small bowel mesentery best appreciated on axial image 88 of series 4 and coronal image 33 of series 7, which is most compatible with mesenteric hemorrhage. Numerous colonic diverticulae are noted, without surrounding inflammatory changes to suggest an acute diverticulitis at this time. The appendix is not confidently identified and may be surgically absent. Regardless, there are no inflammatory changes noted adjacent to the cecum to suggest the presence of an  acute appendicitis at this time. Vascular/Lymphatic: Aortic atherosclerosis. No lymphadenopathy noted in the abdomen or pelvis. Reproductive: Prostate gland and seminal vesicles are unremarkable in appearance. Other: Small amount of low-attenuation ascites in the low anatomic pelvis. No large high attenuation fluid collection in the retroperitoneum to suggest retroperitoneal hemorrhage. While there is no definite evidence of intraperitoneal hemorrhage, there does appear to be mesenteric hemorrhage, as above. Musculoskeletal: No acute displaced fractures or aggressive appearing lytic or blastic lesions are noted in the visualized portions of the skeleton. IMPRESSION: 1. Multiple displaced and nondisplaced left-sided rib fractures with posttraumatic contusion and pneumatocele is a in the left lower lobe, as well as a very small left hemopneumothorax (trace pneumothorax component and trace amount of pleural hemorrhage). 2. Acute compression fracture of superior endplate of T3 with 4 mm of retropulsion of fracture fragments slightly impinging upon the central spinal canal. There is also a nondisplaced fracture through superior endplate of T9 vertebral body. 3. Acute hemorrhage into the root of the small bowel mesentery, as discussed above. 4. Aortic atherosclerosis, in addition to left main and 3 vessel coronary artery disease. Assessment for potential risk factor modification, dietary therapy or pharmacologic therapy may be warranted, if clinically indicated. 5. Colonic diverticulosis without evidence of acute diverticulitis at this time. 6. Additional incidental findings, as above. These results were called by telephone at the time of interpretation on 04/16/2018 at 2:21 pm to Dr. Sherwood Gambler, who verbally acknowledged these results. Aortic Atherosclerosis (ICD10-I70.0). Electronically Signed   By: Vinnie Langton M.D.   On: 04/16/2018 14:21   Ct Head Wo Contrast  Result Date: 04/16/2018 CLINICAL DATA:  20 foot  fall off a ladder. Denies head or neck pain. EXAM: CT HEAD WITHOUT CONTRAST CT  CERVICAL SPINE WITHOUT CONTRAST TECHNIQUE: Multidetector CT imaging of the head and cervical spine was performed following the standard protocol without intravenous contrast. Multiplanar CT image reconstructions of the cervical spine were also generated. COMPARISON:  None. FINDINGS: CT HEAD FINDINGS Brain: No evidence of acute infarction, hemorrhage, hydrocephalus, extra-axial collection or mass lesion/mass effect. Mild to moderate generalized cerebral atrophy. Vascular: Calcified atherosclerosis at the skullbase. No hyperdense vessel. Skull: Normal. Negative for fracture or focal lesion. Sinuses/Orbits: No acute finding. Other: None. CT CERVICAL SPINE FINDINGS Alignment: Normal. Skull base and vertebrae: No acute fracture. No primary bone lesion or focal pathologic process. Soft tissues and spinal canal: No prevertebral fluid or swelling. No visible canal hematoma. Disc levels: Mild-to-moderate disc height loss and facet uncovertebral hypertrophy throughout the cervical spine. Upper chest: Negative. Other: Nondisplaced fracture of the left posterior third rib at the costovertebral junction. Small foci of subcutaneous emphysema in the posterior left chest wall. Left carotid artery stent. Surgical clips in the left anterior neck. IMPRESSION: 1.  No acute intracranial abnormality. 2.  No acute cervical spine fracture. 3. Nondisplaced fracture of the left posterior third rib at the costovertebral junction. Small foci of subcutaneous emphysema in the posterior left chest wall. Please see separate CT chest report for further details. Electronically Signed   By: Titus Dubin M.D.   On: 04/16/2018 14:10   Ct Chest Wo Contrast  Result Date: 04/16/2018 CLINICAL DATA:  77 year old male with history of trauma after falling 20 feet off of a ladder landing onto his left side. EXAM: CT CHEST, ABDOMEN AND PELVIS WITHOUT CONTRAST TECHNIQUE:  Multidetector CT imaging of the chest, abdomen and pelvis was performed following the standard protocol without IV contrast. COMPARISON:  No priors. FINDINGS: CT CHEST FINDINGS Cardiovascular: Heart size is normal. There is no significant pericardial fluid, thickening or pericardial calcification. There is aortic atherosclerosis, as well as atherosclerosis of the great vessels of the mediastinum and the coronary arteries, including calcified atherosclerotic plaque in the left main, left anterior descending, left circumflex and right coronary arteries. Mediastinum/Nodes: Small amount of high attenuation in the posterior mediastinum adjacent to the T3 compression fracture (discussed below), compatible with a small amount of paravertebral hemorrhage. No other high attenuation fluid collection noted within the mediastinum to suggest posttraumatic hemorrhage. No pathologically enlarged mediastinal or hilar lymph nodes. Insert non-contrast hilar disclaimer esophagus is unremarkable in appearance. No axillary lymphadenopathy. Lungs/Pleura: Trace medial left pneumothorax best appreciated on axial images 18 and 19 of series 5. No right pneumothorax. Large amount of airspace consolidation in the left lower lobe, concerning for posttraumatic contusion. Several internal lucencies within this region likely reflect posttraumatic pneumatoceles. Minimal dependent atelectasis in the right lower lobe. Trace amount of high attenuation left pleural fluid, indicative of trace hemothorax. Subpleural thickening in the periphery of the left hemithorax, likely to reflect a small amount of subpleural hemorrhage from multiple left-sided rib fractures (discussed below). Musculoskeletal: Acute compression fracture of superior endplate of T3 with approximately 30% loss of anterior vertebral body height and 4 mm of retropulsion of fracture fragments impinging slightly upon the thoracic spinal canal. Small amount of paravertebral hemorrhage in the  posterior mediastinum (discussed above). Acute mildly displaced fractures of the posterolateral aspect of the left sixth and seventh ribs. Several other nondisplaced left-sided rib fractures (anterolateral left third rib, anterolateral left fourth rib, anterolateral left fifth rib, anterolateral left sixth rib, lateral left seventh rib posterolateral left eighth rib, posterolateral left ninth rib, and posterolateral left tenth rib). Acute nondisplaced  fracture through superior endplate of T9. CT ABDOMEN PELVIS FINDINGS Hepatobiliary: No definite evidence of significant acute traumatic injury to the liver on today's noncontrast CT examination. No definite cystic or solid hepatic lesion. Unenhanced appearance of the gallbladder is normal. Pancreas: No definite evidence of acute traumatic injury to the pancreas on today's noncontrast CT examination. No definite pancreatic mass or peripancreatic fluid or inflammatory changes. Spleen: Unremarkable. Specifically, no definite signs of acute traumatic injury to the spleen. Adrenals/Urinary Tract: No definite evidence of acute traumatic injury to either kidney on today's noncontrast CT examination. Mild left renal atrophy. No hydroureteronephrosis. Unenhanced appearance of the urinary bladder is normal. Specifically, urinary bladder appears intact. Left adrenal gland is normal in appearance. Right adrenal gland is remarkable for a 2.2 x 1.7 cm low-attenuation (2 HU) nodule, compatible with an adenoma. Stomach/Bowel: Unenhanced appearance of the stomach is normal. No pathologic dilatation of small bowel or colon. However, there is extensive soft tissue stranding throughout the small bowel mesentery. In addition, there is a region of high attenuation in the root of the small bowel mesentery best appreciated on axial image 88 of series 4 and coronal image 33 of series 7, which is most compatible with mesenteric hemorrhage. Numerous colonic diverticulae are noted, without  surrounding inflammatory changes to suggest an acute diverticulitis at this time. The appendix is not confidently identified and may be surgically absent. Regardless, there are no inflammatory changes noted adjacent to the cecum to suggest the presence of an acute appendicitis at this time. Vascular/Lymphatic: Aortic atherosclerosis. No lymphadenopathy noted in the abdomen or pelvis. Reproductive: Prostate gland and seminal vesicles are unremarkable in appearance. Other: Small amount of low-attenuation ascites in the low anatomic pelvis. No large high attenuation fluid collection in the retroperitoneum to suggest retroperitoneal hemorrhage. While there is no definite evidence of intraperitoneal hemorrhage, there does appear to be mesenteric hemorrhage, as above. Musculoskeletal: No acute displaced fractures or aggressive appearing lytic or blastic lesions are noted in the visualized portions of the skeleton. IMPRESSION: 1. Multiple displaced and nondisplaced left-sided rib fractures with posttraumatic contusion and pneumatocele is a in the left lower lobe, as well as a very small left hemopneumothorax (trace pneumothorax component and trace amount of pleural hemorrhage). 2. Acute compression fracture of superior endplate of T3 with 4 mm of retropulsion of fracture fragments slightly impinging upon the central spinal canal. There is also a nondisplaced fracture through superior endplate of T9 vertebral body. 3. Acute hemorrhage into the root of the small bowel mesentery, as discussed above. 4. Aortic atherosclerosis, in addition to left main and 3 vessel coronary artery disease. Assessment for potential risk factor modification, dietary therapy or pharmacologic therapy may be warranted, if clinically indicated. 5. Colonic diverticulosis without evidence of acute diverticulitis at this time. 6. Additional incidental findings, as above. These results were called by telephone at the time of interpretation on 04/16/2018  at 2:21 pm to Dr. Sherwood Gambler, who verbally acknowledged these results. Aortic Atherosclerosis (ICD10-I70.0). Electronically Signed   By: Vinnie Langton M.D.   On: 04/16/2018 14:21   Ct Cervical Spine Wo Contrast  Result Date: 04/16/2018 CLINICAL DATA:  20 foot fall off a ladder. Denies head or neck pain. EXAM: CT HEAD WITHOUT CONTRAST CT CERVICAL SPINE WITHOUT CONTRAST TECHNIQUE: Multidetector CT imaging of the head and cervical spine was performed following the standard protocol without intravenous contrast. Multiplanar CT image reconstructions of the cervical spine were also generated. COMPARISON:  None. FINDINGS: CT HEAD FINDINGS Brain: No evidence  of acute infarction, hemorrhage, hydrocephalus, extra-axial collection or mass lesion/mass effect. Mild to moderate generalized cerebral atrophy. Vascular: Calcified atherosclerosis at the skullbase. No hyperdense vessel. Skull: Normal. Negative for fracture or focal lesion. Sinuses/Orbits: No acute finding. Other: None. CT CERVICAL SPINE FINDINGS Alignment: Normal. Skull base and vertebrae: No acute fracture. No primary bone lesion or focal pathologic process. Soft tissues and spinal canal: No prevertebral fluid or swelling. No visible canal hematoma. Disc levels: Mild-to-moderate disc height loss and facet uncovertebral hypertrophy throughout the cervical spine. Upper chest: Negative. Other: Nondisplaced fracture of the left posterior third rib at the costovertebral junction. Small foci of subcutaneous emphysema in the posterior left chest wall. Left carotid artery stent. Surgical clips in the left anterior neck. IMPRESSION: 1.  No acute intracranial abnormality. 2.  No acute cervical spine fracture. 3. Nondisplaced fracture of the left posterior third rib at the costovertebral junction. Small foci of subcutaneous emphysema in the posterior left chest wall. Please see separate CT chest report for further details. Electronically Signed   By: Titus Dubin  M.D.   On: 04/16/2018 14:10   Dg Pelvis Portable  Result Date: 04/16/2018 CLINICAL DATA:  Trauma secondary to a fall from a ladder today. EXAM: PORTABLE PELVIS 1-2 VIEWS COMPARISON:  None. FINDINGS: There is no evidence of pelvic fracture or diastasis. No pelvic bone lesions are seen. IMPRESSION: Negative. Electronically Signed   By: Lorriane Shire M.D.   On: 04/16/2018 12:10   Dg Chest Port 1 View  Result Date: 04/17/2018 CLINICAL DATA:  Pulmonary contusion. EXAM: PORTABLE CHEST 1 VIEW COMPARISON:  CT 04/16/2018. FINDINGS: Mediastinum is stable. Heart size stable. Persistent left lower lobe infiltrate/contusion. Small left pleural effusion. No pneumothorax. Vascular stent noted over the left neck. Multiple left-sided rib fractures again noted. Degenerative changes thoracic spine. IMPRESSION: 1. Multiple left-sided rib fractures are again noted. No pneumothorax. 2. Persistent left lower lobe infiltrate/contusion and small left pleural effusion. Electronically Signed   By: Marcello Moores  Register   On: 04/17/2018 07:37   Dg Chest Portable 1 View  Result Date: 04/16/2018 CLINICAL DATA:  Patient fell from a ladder. Diminished breath sounds. EXAM: PORTABLE CHEST 1 VIEW COMPARISON:  None. FINDINGS: There are multiple displaced left lateral rib fractures. There is no pneumothorax. Slight haziness in the left perihilar region. Heart size and pulmonary vascularity are normal. Vague 8 6 mm density overlies the anterior aspect of the right third rib. This probably represents a confluence of normal structures but could represent a tiny pulmonary nodule. Aortic atherosclerosis. IMPRESSION: 1. Multiple displaced left lateral rib fractures without a discrete pneumothorax or pleural effusion. Fractures of the left third through ninth ribs. 2. Slight left perihilar haziness could represent lung contusion. 3. Possible small nodule in the right mid lung zone. 4.  Aortic Atherosclerosis (ICD10-I70.0). Electronically Signed   By:  Lorriane Shire M.D.   On: 04/16/2018 11:59    Anti-infectives: Anti-infectives (From admission, onward)   None      Assessment/Plan: s/p  Advance diet Hyperkalemia, will change IVFs  PT/OT  LOS: 1 day   Kathryne Eriksson. Dahlia Bailiff, MD, FACS [phone] Trauma Surgeon 04/17/2018

## 2018-04-17 NOTE — Evaluation (Signed)
Occupational Therapy Evaluation Patient Details Name: Xavier Davis MRN: [id number] DOB: 14-Jul-1941 Today's Date: 04/17/2018    History of Present Illness 77 y.o. male admitted on 04/16/18 after falling from a ladder while trmming a tree.  He sustained a L rib fractures with pulmonary contusion, hemothorax, pneumatoceles, mesenteric hemorrhage (non surgical), T3 endplate compression fx with 79mm of retropulsion/non-displacedT9 endplate fx (treated with bracing).  Pt with significant PMH of HTN.   Clinical Impression   PTA Pt independent in ADL and mobility. Pt is currently max A for LB ADL, min guard assist for standing grooming, min guard assist for toilet transfers in bathroom with assist from RW. Pt will benefit from continued skilled OT services in the acute setting and afterwards at the [facility] level to maximize safety and independence in ADL and functional transfers. Next session to focus on bed mobility (not observed today) and AE education for wife and pt to assist with ADL. Also, reinforce brace education.     Follow Up Recommendations  Home health OT;Supervision/Assistance - 24 hour(initially)    Equipment Recommendations  Tub/shower seat(with back)    Recommendations for Other Services       Precautions / Restrictions Precautions Precautions: Back Precaution Booklet Issued: Yes (comment) Precaution Comments: reviewed in full Required Braces or Orthoses: Spinal Brace Spinal Brace: Thoracolumbosacral orthotic;Other (comment)(ASPEN collar) Spinal Brace Comments: when OOB.      Mobility Bed Mobility               General bed mobility comments: Pt was OOB in the recliner chair.   Transfers Overall transfer level: Needs assistance Equipment used: Rolling walker (2 wheeled) Transfers: Sit to/from Stand Sit to Stand: Min assist         General transfer comment: vc for safe hand placement, slight power up assist    Balance Overall balance assessment: Needs  assistance Sitting-balance support: Feet supported;Bilateral upper extremity supported Sitting balance-Leahy Scale: Fair     Standing balance support: Bilateral upper extremity supported Standing balance-Leahy Scale: Poor                             ADL either performed or assessed with clinical judgement   ADL Overall ADL's : Needs assistance/impaired Eating/Feeding: Set up;With caregiver independent assisting   Grooming: Min guard;Standing;Cueing for compensatory techniques Grooming Details (indicate cue type and reason): educated in compensatory strategies (ex: cup for oral care) and setting up items on dominant side Upper Body Bathing: Moderate assistance;With caregiver independent assisting;Sitting   Lower Body Bathing: Moderate assistance   Upper Body Dressing : Maximal assistance;Sitting   Lower Body Dressing: Maximal assistance;Sit to/from stand   Toilet Transfer: Min guard;Minimal assistance;Ambulation;Comfort height toilet;Grab bars;RW Armed forces technical officer Details (indicate cue type and reason): min guard assist, pursed lip breathing once sitting on toilet Toileting- Clothing Manipulation and Hygiene: Maximal assistance;Sit to/from stand       Functional mobility during ADLs: Min guard;Minimal assistance;Rolling walker General ADL Comments: initiated verbal education for AE to assist with ADL     Vision Patient Visual Report: No change from baseline       Perception     Praxis      Pertinent Vitals/Pain Pain Assessment: 0-10 Pain Score: 8  Pain Location: lower back Pain Descriptors / Indicators: Grimacing;Guarding Pain Intervention(s): Limited activity within patient's tolerance;Monitored during session;Repositioned     Hand Dominance Right   Extremity/Trunk Assessment     Lower Extremity Assessment Lower Extremity Assessment:  Defer to PT evaluation   Cervical / Trunk Assessment Cervical / Trunk Assessment: Other exceptions Cervical / Trunk  Exceptions: pt immobilized in ASPEN and TLSO during my assessment.    Communication Communication Communication: No difficulties   Cognition Arousal/Alertness: Awake/alert Behavior During Therapy: WFL for tasks assessed/performed Overall Cognitive Status: Within Functional Limits for tasks assessed                                     General Comments       Exercises     Shoulder Instructions      Home Living Family/patient expects to be discharged to:: Private residence Living Arrangements: Spouse/significant other Available Help at Discharge: Family;Available 24 hours/day Type of Home: House Home Access: Stairs to enter [employer] of Steps: 3 Entrance Stairs-Rails: Right Home Layout: One level     Bathroom Shower/Tub: Occupational psychologist: Handicapped height(comfort height)     Home Equipment: None          Prior Functioning/Environment Level of Independence: Independent        Comments: drives        OT Problem List: Decreased range of motion;Decreased activity tolerance;Impaired balance (sitting and/or standing);Decreased safety awareness;Decreased knowledge of use of DME or AE;Decreased knowledge of precautions;Cardiopulmonary status limiting activity;Pain      OT Treatment/Interventions: Self-care/ADL training;DME and/or AE instruction;Therapeutic activities;Patient/family education;Balance training    OT Goals(Current goals can be found in the care plan section) Acute Rehab OT Goals Patient Stated Goal: to go home at discharge OT Goal Formulation: With patient/family Time For Goal Achievement: 05/01/18 Potential to Achieve Goals: Good ADL Goals Pt Will Perform Grooming: with supervision;standing Pt Will Perform Upper Body Dressing: with min assist;with caregiver independent in assisting;sitting Pt Will Perform Lower Body Dressing: with min assist;with caregiver independent in assisting;sit to/from stand;with  adaptive equipment Pt Will Transfer to Toilet: with supervision;ambulating Pt Will Perform Toileting - Clothing Manipulation and hygiene: with min guard assist;with caregiver independent in assisting;with adaptive equipment;sit to/from stand Additional ADL Goal #1: Pt will perform bed mobility at supervision level (maintaining precautions) prior to engaing in ADL activity  OT Frequency: Min 2X/week   Barriers to D/C:            Co-evaluation              AM-PAC PT "6 Clicks" Daily Activity     Outcome Measure Help from another person eating meals?: A Little Help from another person taking care of personal grooming?: A Little Help from another person toileting, which includes using toliet, bedpan, or urinal?: A Lot Help from another person bathing (including washing, rinsing, drying)?: A Little Help from another person to put on and taking off regular upper body clothing?: A Lot Help from another person to put on and taking off regular lower body clothing?: A Lot 6 Click Score: 15   End of Session Equipment Utilized During Treatment: Gait belt;Rolling walker;Back brace;Cervical collar Nurse Communication: Mobility status;Precautions  Activity Tolerance: Patient tolerated treatment well Patient left: in chair;with call bell/phone within reach;with family/visitor present  OT Visit Diagnosis: Unsteadiness on feet (R26.81);Other abnormalities of gait and mobility (R26.89);History of falling (Z91.81);Pain Pain - Right/Left: (central) Pain - part of body: (back)                Time: 9528-4132 OT Time Calculation (min): 25 min Charges:  OT General Charges $OT Visit:  1 Visit OT Evaluation $OT Eval Moderate Complexity: 1 Mod OT Treatments $Self Care/Home Management : 8-22 mins G-Codes:     Hulda Humphrey OTR/L Young 04/17/2018, 9:28 PM

## 2018-04-17 NOTE — Clinical Social Work Note (Signed)
Clinical Social Worker met with patient and patient spouse at bedside to offer support and discuss patient needs at discharge.  Patient states that he was up on a 18 foot ladder trimming limbs when one of the limbs struck the ladder on the way down.  Patient fell from the ladder and had to be picked up by EMS for hospital admission.  Patient currently lives at home with his wife who is able to provide 24/7 supervision in their one story home in [place].  Patient is hopeful to return home at discharge, however agreeable with potential rehab options follow therapy recommendations.    Clinical Social Worker inquired about current substance use.  Patient states that he does not drink alcohol or use drugs.  SBIRT complete.  No resources needed.  CSW remains available for support and to assist with facilitating patient discharge needs if appropriate.  Jesse , LCSW [phone]  

## 2018-04-17 NOTE — Evaluation (Signed)
Physical Therapy Evaluation Patient Details Name: Xavier Davis MRN: [id number] DOB: [birth year] Today's Date: 04/17/2018   History of Present Illness  77 y.o. male admitted on 04/16/18 after falling from a ladder while trmming a tree.  He sustained a L rib fractures with pulmonary contusion, hemothorax, pneumatoceles, mesenteric hemorrhage (non surgical), T3 endplate compression fx with 46mm of retropulsion/non-displacedT9 endplate fx (treated with bracing).  Pt with significant PMH of HTN.  Clinical Impression  Pt was able to ambulate the hallway with RW and min assist overall.  He did regularly de-saturate during gait, but recovered with pursed lip breathing and standing rest breaks.  I reviewed incentive spirometer use and pillow for coughing to brace.  Back precaution education initiated.  I anticipate he will progress well enough to d/c home with his wife's supervision.   PT to follow acutely for deficits listed below.      Follow Up Recommendations Home health PT;Supervision for mobility/OOB    Equipment Recommendations  Rolling walker with 5" wheels;3in1 (PT);Other (comment)(wife requesting a shower seat with back)    Recommendations for Other Services   NA    Precautions / Restrictions Precautions Precautions: Back Precaution Booklet Issued: Yes (comment) Precaution Comments: back precaution handout given and reviewed with pt and his wife,  Required Braces or Orthoses: Spinal Brace Spinal Brace: Thoracolumbosacral orthotic;Other (comment)(ASPEN collar) Spinal Brace Comments: when OOB.      Mobility  Bed Mobility               General bed mobility comments: Pt was OOB in the recliner chair.   Transfers Overall transfer level: Needs assistance Equipment used: Rolling walker (2 wheeled) Transfers: Sit to/from Stand Sit to Stand: Min assist         General transfer comment: Min assist to help power up to stand, vebal cues for safe RW use and safe hand placement.    Ambulation/Gait Ambulation/Gait assistance: Min guard Gait Distance (Feet): 75 Feet Assistive device: Rolling walker (2 wheeled) Gait Pattern/deviations: Step-through pattern     General Gait Details: Pt with light hands on RW indicating that he may not need it for gait during future sessions.  We had to stop and take standing rest breaks every 20' or so because his O2 sats dropped into the mid 80s.  They would come back up with pursed lip breathing.           Balance Overall balance assessment: Needs assistance Sitting-balance support: Feet supported;Bilateral upper extremity supported Sitting balance-Leahy Scale: Fair     Standing balance support: Bilateral upper extremity supported Standing balance-Leahy Scale: Poor                               Pertinent Vitals/Pain Pain Assessment: 0-10 Pain Score: 7  Pain Location: lower back Pain Descriptors / Indicators: Grimacing;Guarding Pain Intervention(s): Limited activity within patient's tolerance;Monitored during session;Repositioned    Home Living Family/patient expects to be discharged to:: Private residence Living Arrangements: Spouse/significant other Available Help at Discharge: Family;Available 24 hours/day Type of Home: House Home Access: Stairs to enter Entrance Stairs-Rails: Right Entrance Stairs-Number of Steps: 3 Home Layout: One level Home Equipment: None      Prior Function Level of Independence: Independent         Comments: drives     Hand Dominance   Dominant Hand: Right    Extremity/Trunk Assessment   Upper Extremity Assessment Upper Extremity Assessment: Defer to OT evaluation  Lower Extremity Assessment Lower Extremity Assessment: Overall WFL for tasks assessed(no obvious signs of buckling, no reports of tingling)    Cervical / Trunk Assessment Cervical / Trunk Assessment: Other exceptions Cervical / Trunk Exceptions: pt immobilized in ASPEN and TLSO during my  assessment.   Communication   Communication: No difficulties  Cognition Arousal/Alertness: Awake/alert Behavior During Therapy: WFL for tasks assessed/performed Overall Cognitive Status: Within Functional Limits for tasks assessed                                        General Comments General comments (skin integrity, edema, etc.): Educated on incentive spirometer use as pt is only getting max 750 mL during trial and instruction.  Educated on pillow hug to cough.  O2 [place] re-applied at the end of the session as at rest he was sitting at 90% on RA.         Assessment/Plan    PT Assessment Patient needs continued PT services  PT Problem List Decreased activity tolerance;Decreased balance;Decreased mobility;Decreased knowledge of use of DME;Decreased knowledge of precautions;Pain;Cardiopulmonary status limiting activity       PT Treatment Interventions Stair training;Gait training;DME instruction;Functional mobility training;Therapeutic activities;Therapeutic exercise;Balance training;Patient/family education    PT Goals (Current goals can be found in the Care Plan section)  Acute Rehab PT Goals Patient Stated Goal: to go home at discharge PT Goal Formulation: With patient Time For Goal Achievement: 05/01/18 Potential to Achieve Goals: Good    Frequency Min 5X/week           AM-PAC PT "6 Clicks" Daily Activity  Outcome Measure Difficulty turning over in bed (including adjusting bedclothes, sheets and blankets)?: A Little Difficulty moving from lying on back to sitting on the side of the bed? : A Little Difficulty sitting down on and standing up from a chair with arms (e.g., wheelchair, bedside commode, etc,.)?: A Little Help needed moving to and from a bed to chair (including a wheelchair)?: A Little Help needed walking in hospital room?: A Little Help needed climbing 3-5 steps with a railing? : A Lot 6 Click Score: 17    End of Session Equipment Utilized  During Treatment: Gait belt;Back brace;Cervical collar Activity Tolerance: Patient tolerated treatment well Patient left: in chair;with call bell/phone within reach;with family/visitor present Nurse Communication: Mobility status PT Visit Diagnosis: Difficulty in walking, not elsewhere classified (R26.2);Pain Pain - Right/Left: (back) Pain - part of body: (back)    Time: 8338-2505 PT Time Calculation (min) (ACUTE ONLY): 33 min   Charges:         Wells Guiles B. Burlin Mcnair, PT, DPT [phone]   PT Evaluation $PT Eval Low Complexity: 1 Low PT Treatments $Gait Training: 8-22 mins   04/17/2018, 5:38 PM

## 2018-04-18 ENCOUNTER — Inpatient Hospital Stay (HOSPITAL_COMMUNITY): Payer: Medicare Other

## 2018-04-18 LAB — BASIC METABOLIC PANEL
Anion gap: 4 — ABNORMAL LOW (ref 5–15)
BUN: 25 mg/dL — AB (ref 6–20)
CHLORIDE: 103 mmol/L (ref 101–111)
CO2: 22 mmol/L (ref 22–32)
CREATININE: 1.47 mg/dL — AB (ref 0.61–1.24)
Calcium: 7.8 mg/dL — ABNORMAL LOW (ref 8.9–10.3)
GFR calc Af Amer: 52 mL/min — ABNORMAL LOW (ref >60)
GFR calc non Af Amer: 45 mL/min — ABNORMAL LOW (ref >60)
Glucose, Bld: 158 mg/dL — ABNORMAL HIGH (ref 65–99)
Potassium: 4.1 mmol/L (ref 3.5–5.1)
Sodium: 129 mmol/L — ABNORMAL LOW (ref 135–145)

## 2018-04-18 LAB — CBC WITH DIFFERENTIAL/PLATELET
Abs Immature Granulocytes: 0.1 10*3/uL (ref 0.0–0.1)
Basophils Absolute: 0 10*3/uL (ref 0.0–0.1)
Basophils Relative: 0 %
EOS ABS: 0.1 10*3/uL (ref 0.0–0.7)
EOS PCT: 1 %
HEMATOCRIT: 22.9 % — AB (ref 39.0–52.0)
HEMOGLOBIN: 7.8 g/dL — AB (ref 13.0–17.0)
IMMATURE GRANULOCYTES: 1 %
LYMPHS ABS: 0.9 10*3/uL (ref 0.7–4.0)
LYMPHS PCT: 12 %
MCH: 33.2 pg (ref 26.0–34.0)
MCHC: 34.1 g/dL (ref 30.0–36.0)
MCV: 97.4 fL (ref 78.0–100.0)
Monocytes Absolute: 0.8 10*3/uL (ref 0.1–1.0)
Monocytes Relative: 10 %
NEUTROS PCT: 78 %
Neutro Abs: 6.2 10*3/uL (ref 1.7–7.7)
Platelets: 73 10*3/uL — ABNORMAL LOW (ref 150–400)
RBC: 2.35 MIL/uL — AB (ref 4.22–5.81)
RDW: 11.9 % (ref 11.5–15.5)
WBC: 8 10*3/uL (ref 4.0–10.5)

## 2018-04-18 LAB — CBC
HCT: 25.5 % — ABNORMAL LOW (ref 39.0–52.0)
HEMOGLOBIN: 8.7 g/dL — AB (ref 13.0–17.0)
MCH: 33.6 pg (ref 26.0–34.0)
MCHC: 34.1 g/dL (ref 30.0–36.0)
MCV: 98.5 fL (ref 78.0–100.0)
PLATELETS: 85 10*3/uL — AB (ref 150–400)
RBC: 2.59 MIL/uL — AB (ref 4.22–5.81)
RDW: 11.9 % (ref 11.5–15.5)
WBC: 10.4 10*3/uL (ref 4.0–10.5)

## 2018-04-18 LAB — ABO/RH: ABO/RH(D): A POS

## 2018-04-18 MED ORDER — SODIUM CHLORIDE 1 G PO TABS
1.0000 g | ORAL_TABLET | Freq: Three times a day (TID) | ORAL | Status: AC
  Administered 2018-04-18 – 2018-04-19 (×3): 1 g via ORAL
  Filled 2018-04-18 (×3): qty 1

## 2018-04-18 NOTE — Care Management Important Message (Signed)
Important Message  Patient Details  Name: Xavier Davis MRN: [id number] Date of Birth: Apr 27, 1941   Medicare Important Message Given:  Yes    Orbie Pyo 04/18/2018, 12:22 PM

## 2018-04-18 NOTE — Progress Notes (Signed)
CC:  Fall from ladder  Subjective: Up in the chair, with brace and collar on.  Sore and tender left chest, upper abdomen.  Not very comfortable with the brace and collar.  He is alert and talking, tolerating the treatments.  He says he is OK with oral intake, and passing some flatus.    Objective: Vital signs in last 24 hours: Temp:  [98 F (36.7 C)-99.6 F (37.6 C)] 98.2 F (36.8 C) (06/14 0836) Pulse Rate:  [66-87] 66 (06/14 0400) Resp:  [10-19] 10 (06/14 0400) BP: (119-163)/(48-70) 119/48 (06/14 0400) SpO2:  [93 %-96 %] 93 % (06/14 0400) Last BM Date: 04/16/18 480 PO 1387 IV Urine 1000 Afebrile, VSS Na 129,  Creatinine is better H/H is down and platelets are also down. Meds:  Tylenol q8, Celexa , colace, PPI, Proscar/flomax, robaxin, tramadol CXR today - persistent left base atelectasis, infiltrate and contusion, small left pleural effusion   Intake/Output from previous day: 06/13 0701 - 06/14 0700 In: 1867.5 [P.O.:480; I.V.:1387.5] Out: 1000 [Urine:1000] Intake/Output this shift: No intake/output data recorded.  General appearance: alert, cooperative and no distress Neck: no adenopathy, no carotid bruit, no JVD, supple, symmetrical, trachea midline, thyroid not enlarged, symmetric, no tenderness/mass/nodules and C collar in place.  Resp: clear to auscultation bilaterally and he is only pulling about 500 on IS while I am in the room, still on O2.   Chest wall: no tenderness, left sided chest wall tenderness GI: Tolerating diet, sitting up, I can't tell that he is distended, most of tenderness is on the left side, more toward his chest.  + BS, and flatus.  Extremities: extremities normal, atraumatic, no cyanosis or edema  Lab Results:  Recent Labs    04/17/18 0336 04/18/18 0435  WBC 9.0 8.0  HGB 9.4* 7.8*  HCT 27.5* 22.9*  PLT 108* 73*    BMET Recent Labs    04/17/18 0336 04/18/18 0435  NA 136 129*  K 5.1 4.1  CL 106 103  CO2 22 22  GLUCOSE 226*  158*  BUN 36* 25*  CREATININE 1.77* 1.47*  CALCIUM 8.1* 7.8*   PT/INR Recent Labs    04/16/18 1140  LABPROT 13.7  INR 1.06    Recent Labs  Lab 04/16/18 1140  AST 52*  ALT 45  ALKPHOS 49  BILITOT 0.9  PROT 6.1*  ALBUMIN 3.8     Lipase  No results found for: LIPASE   Medications: . acetaminophen  1,000 mg Oral Q8H  . citalopram  40 mg Oral Daily  . docusate sodium  100 mg Oral BID  . finasteride  5 mg Oral Daily  . methocarbamol  500 mg Oral TID  . pantoprazole  40 mg Oral Q24H  . sodium chloride  1 g Oral TID WC  . tamsulosin  0.4 mg Oral Daily  . traZODone  50 mg Oral QHS   . famotidine (PEPCID) IV 20 mg (04/16/18 1627)    Assessment/Plan  Fall from ladder 04/16/18 Left rib fractures (left 3-8th ribs) with pulmonary contusion and small hemothorax, pneumatoceles - multimodal pain control, IS, pulm toilet - PT/OT - repeat CXR in AM  Mesenteric hemorrhage with soft tissue stranding - abdominal exam with some TTP on left side but nothing to suggest acute surgical issue - repeat lactate and cbc 1800 - serial abdominal exams - CLD  T3 endplate compression fracture with 56mm of retropulsion/Non-displaced T9 endplate fracture -  NS consult. 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 Sherley Bounds - 2 weeks  Plan:  Continue IS, H/H is down , platelets are down, done around 4:30 AM  I will recheck, cbc, type and cross.  Follow closely, continue therapies.  Will need home health PT/OT, 24 hour supervision,        LOS: 2 days    Xavier Davis 04/18/2018 [phone]

## 2018-04-18 NOTE — Progress Notes (Signed)
Called lab regarding missing cbc and H&H, from this afternoon. They said it was taken out and will send someone do do them now.

## 2018-04-18 NOTE — Care Management Note (Signed)
Case Management Note  Patient Details  Name: Xavier Davis MRN: [id number] Date of Birth: November 14, 1940  Subjective/Objective:                    Action/Plan:  Went to discuss home health and DME with patient. Trauma service getting ready to insert chest tube. Will continue to follow. Expected Discharge Date:                  Expected Discharge Plan:  [place]  In-House Referral:     Discharge planning Services  CM Consult  Post Acute Care Choice:  Durable Medical Equipment, Home Health Choice offered to:     DME Arranged:  3-N-1, Walker rolling DME Agency:  [place] Arranged:  PT, OT [facility] Agency:     Status of Service:  In process, will continue to follow  If discussed at Long Length of Stay Meetings, dates discussed:    Additional Comments:  Marilu Favre, RN 04/18/2018, 11:53 AM

## 2018-04-18 NOTE — Procedures (Addendum)
Chest Tube Insertion Procedure Note  Indications:  Clinically significant Hemothorax  Pre-operative Diagnosis: Hemothorax  Post-operative Diagnosis: Hemothorax  Procedure Details  Informed consent was obtained for the procedure, including sedation.  Risks of lung perforation, hemorrhage, arrhythmia, and adverse drug reaction were discussed.   After sterile skin prep, using standard technique, a 14 French tube was placed in the left anterior 5th rib space.  Findings: Air and small amount of blood aspirated   Estimated Blood Loss:  Minimal         Specimens:  None              Complications:  None; patient tolerated the procedure well.         Disposition: Patient remains in SDU and stable         Condition: stable  Brigid Re , [facility] Surgery 04/18/2018, 1:25 PM Pager: [phone] Mon-Fri 7:00 am-4:30 pm Sat-Sun 7:00 am-11:30 am

## 2018-04-18 NOTE — Progress Notes (Signed)
Physical Therapy Treatment Patient Details Name: Xavier Davis MRN: [id number] DOB: 1940/12/21 Today's Date: 04/18/2018    History of Present Illness 77 y.o. male admitted on 04/16/18 after falling from a ladder while trmming a tree.  He sustained a L rib fractures with pulmonary contusion, hemothorax, pneumatoceles, mesenteric hemorrhage (non surgical), T3 endplate compression fx with 43mm of retropulsion/non-displacedT9 endplate fx (treated with bracing).  Pt with significant PMH of HTN.  Pt s/p L CT insertion on 04/18/18.      PT Comments    Pt with new CT to continuous suction.  We ambulated around the room several times with RW and 5 L O2 [address].  DOE 2/4, sats 94% and educated on incentive spirometer, bracing to cough with a pillow and trying not to avoid coughing (as he was when I came into the room).  PT will continue to follow acutely in hopes to get this gentleman home at discharge.    Follow Up Recommendations  Home health PT;Supervision for mobility/OOB     Equipment Recommendations  Rolling walker with 5" wheels;3in1 (PT);Other (comment)(shower seat with a back)    Recommendations for Other Services   NA     Precautions / Restrictions Precautions Precautions: Back Precaution Comments: reviewed back brace in detail with wife. reviewed precautions patient could recall 1 out 3 ( bending_ Required Braces or Orthoses: Spinal Brace;Cervical Brace Spinal Brace: Thoracolumbosacral orthotic;Other (comment) Spinal Brace Comments: when OOB.    Mobility  Bed Mobility               General bed mobility comments: oob in chair   Transfers Overall transfer level: Needs assistance Equipment used: Rolling walker (2 wheeled) Transfers: Sit to/from Stand Sit to Stand: Min guard;Min assist         General transfer comment: Initially min assist, but progressed to min guard after several sit to stands. Verbal cues to scoot out first and for safe hand placement.    Ambulation/Gait Ambulation/Gait assistance: Min guard Gait Distance (Feet): 20 Feet(x3) Assistive device: Rolling walker (2 wheeled) Gait Pattern/deviations: Step-through pattern     General Gait Details: We were limited today by continuous suction CT line and I kept him on the 5 L O2 [place] and we walked laps around the room taking 2 seated rest breaks.  During rest breaks we practiced pursed lip breathing, incentive spirometer (max inspired volume 750 mL) and coughing while splinting with the pillow.            Balance Overall balance assessment: Needs assistance Sitting-balance support: Feet supported;No upper extremity supported Sitting balance-Leahy Scale: Fair     Standing balance support: Bilateral upper extremity supported Standing balance-Leahy Scale: Fair                              Cognition Arousal/Alertness: Awake/alert Behavior During Therapy: WFL for tasks assessed/performed Overall Cognitive Status: Within Functional Limits for tasks assessed                                           General Comments General comments (skin integrity, edema, etc.): Educated on the importance of not avoiding coughing.        Pertinent Vitals/Pain Pain Assessment: Faces Faces Pain Scale: Hurts even more Pain Location: back and left side of chest Pain Descriptors / Indicators: Grimacing;Guarding Pain Intervention(s):  Limited activity within patient's tolerance;Monitored during session;Repositioned           PT Goals (current goals can now be found in the care plan section) Acute Rehab PT Goals Patient Stated Goal: to go home at discharge Progress towards PT goals: Progressing toward goals    Frequency    Min 5X/week      PT Plan Current plan remains appropriate       AM-PAC PT "6 Clicks" Daily Activity  Outcome Measure  Difficulty turning over in bed (including adjusting bedclothes, sheets and blankets)?: A Little Difficulty  moving from lying on back to sitting on the side of the bed? : A Little Difficulty sitting down on and standing up from a chair with arms (e.g., wheelchair, bedside commode, etc,.)?: A Little Help needed moving to and from a bed to chair (including a wheelchair)?: A Little Help needed walking in hospital room?: A Little Help needed climbing 3-5 steps with a railing? : A Little 6 Click Score: 18    End of Session Equipment Utilized During Treatment: Oxygen;Back brace;Cervical collar(5 L O2 [place]) Activity Tolerance: No increased pain;Patient limited by fatigue Patient left: in chair;with call bell/phone within reach;with family/visitor present Nurse Communication: Mobility status PT Visit Diagnosis: Difficulty in walking, not elsewhere classified (R26.2);Pain Pain - Right/Left: Left Pain - part of body: (ribs)     Time: 6286-3817 PT Time Calculation (min) (ACUTE ONLY): 26 min  Charges:  $Gait Training: 8-22 mins $Self Care/Home Management: 8-22          Sammy Cassar B. [place], [place], DPT [phone]            04/18/2018, 5:22 PM

## 2018-04-18 NOTE — Progress Notes (Signed)
Paged on call trauma to notify them patient coughing up 3 large moderate sized clots. No new orders.

## 2018-04-18 NOTE — Progress Notes (Signed)
Occupational Therapy Treatment Patient Details Name: Xavier Davis MRN: [id number] DOB: 10/07/1941 Today's Date: 04/18/2018    History of present illness 77 y.o. male admitted on 04/16/18 after falling from a ladder while trmming a tree.  He sustained a L rib fractures with pulmonary contusion, hemothorax, pneumatoceles, mesenteric hemorrhage (non surgical), T3 endplate compression fx with 10m of retropulsion/non-displacedT9 endplate fx (treated with bracing).  Pt with significant PMH of HTN.   OT comments  Pt completed LB dressing with figure 4 crossing and don doff brace with wife (A). Pt progressing toward goals this session. Next session to attempt more bed mobility education and basic transfer after chest tube placement.   Follow Up Recommendations  Home health OT;Supervision/Assistance - 24 hour    Equipment Recommendations  Tub/shower seat    Recommendations for Other Services      Precautions / Restrictions Precautions Precautions: Back Precaution Comments: reviewed back brace in detail with wife. reviewed precautions patient could recall 1 out 3 ( bending_ Required Braces or Orthoses: Spinal Brace;Cervical Brace Spinal Brace: Thoracolumbosacral orthotic;Other (comment) Spinal Brace Comments: when OOB.       Mobility Bed Mobility               General bed mobility comments: oob in chair   Transfers                 General transfer comment: not completed due to arrival of team to place chest tube. discussed with trauma PA the placement so that aspen brace can be placed    Balance                                           ADL either performed or assessed with clinical judgement   ADL Overall ADL's : Needs assistance/impaired                     Lower Body Dressing: Min guard Lower Body Dressing Details (indicate cue type and reason): pt is able to cross bil Le for figure  4 dressing LB. pt educated on AE reacher as needed  and use for picking up objects off floor. pt does not require reacher to reacher feet.                General ADL Comments: pt in chair on arrival. pt and wife demonstrate don doff brace mod I. wife with questions regarding aspen brace with TLSO and ccollar. wife educated on change of pads and ccollar in supine. pt coughing and very guarded. pt pending chest tube placement with oxygen 88% and staff entering/ exiting room for placement.      Vision       Perception     Praxis      Cognition Arousal/Alertness: Awake/alert Behavior During Therapy: WFL for tasks assessed/performed Overall Cognitive Status: Within Functional Limits for tasks assessed                                          Exercises     Shoulder Instructions       General Comments      Pertinent Vitals/ Pain       Pain Assessment: Faces Faces Pain Scale: Hurts even more Pain Location: back  Pain Descriptors / Indicators: Grimacing;Guarding  Pain Intervention(s): Limited activity within patient's tolerance;Monitored during session;Premedicated before session  Home Living                                          Prior Functioning/Environment              Frequency  Min 2X/week        Progress Toward Goals  OT Goals(current goals can now be found in the care plan section)  Progress towards OT goals: Progressing toward goals  Acute Rehab OT Goals Patient Stated Goal: to go home at discharge OT Goal Formulation: With patient/family Time For Goal Achievement: 05/01/18 Potential to Achieve Goals: Good ADL Goals Pt Will Perform Grooming: with supervision;standing Pt Will Perform Upper Body Dressing: with min assist;with caregiver independent in assisting;sitting Pt Will Perform Lower Body Dressing: with min assist;with caregiver independent in assisting;sit to/from stand;with adaptive equipment(MEt) Pt Will Transfer to Toilet: with supervision;ambulating Pt  Will Perform Toileting - Clothing Manipulation and hygiene: with min guard assist;with caregiver independent in assisting;with adaptive equipment;sit to/from stand Additional ADL Goal #1: Pt will perform bed mobility at supervision level (maintaining precautions) prior to engaing in ADL activity  Plan Discharge plan remains appropriate    Co-evaluation                 AM-PAC PT "6 Clicks" Daily Activity     Outcome Measure   Help from another person eating meals?: A Little Help from another person taking care of personal grooming?: A Little Help from another person toileting, which includes using toliet, bedpan, or urinal?: A Lot Help from another person bathing (including washing, rinsing, drying)?: A Little Help from another person to put on and taking off regular upper body clothing?: A Lot Help from another person to put on and taking off regular lower body clothing?: A Lot 6 Click Score: 15    End of Session Equipment Utilized During Treatment: Cervical collar;Back brace  OT Visit Diagnosis: Unsteadiness on feet (R26.81);Other abnormalities of gait and mobility (R26.89);History of falling (Z91.81);Pain   Activity Tolerance Patient tolerated treatment well   Patient Left in chair;with call bell/phone within reach;with family/visitor present   Nurse Communication Mobility status;Precautions        Time: 4503-8882 OT Time Calculation (min): 19 min  Charges: OT General Charges $OT Visit: 1 Visit OT Treatments $Self Care/Home Management : 8-22 mins   Jeri Modena   OTR/L Pager: [phone] Office: [phone] .    Parke Poisson B 04/18/2018, 3:28 PM

## 2018-04-19 ENCOUNTER — Inpatient Hospital Stay (HOSPITAL_COMMUNITY): Payer: Medicare Other

## 2018-04-19 LAB — COMPREHENSIVE METABOLIC PANEL
ALT: 33 U/L (ref 17–63)
AST: 30 U/L (ref 15–41)
Albumin: 3.2 g/dL — ABNORMAL LOW (ref 3.5–5.0)
Alkaline Phosphatase: 36 U/L — ABNORMAL LOW (ref 38–126)
Anion gap: 7 (ref 5–15)
BUN: 21 mg/dL — ABNORMAL HIGH (ref 6–20)
CHLORIDE: 101 mmol/L (ref 101–111)
CO2: 22 mmol/L (ref 22–32)
CREATININE: 1.34 mg/dL — AB (ref 0.61–1.24)
Calcium: 8.6 mg/dL — ABNORMAL LOW (ref 8.9–10.3)
GFR, EST AFRICAN AMERICAN: 58 mL/min — AB (ref >60)
GFR, EST NON AFRICAN AMERICAN: 50 mL/min — AB (ref >60)
Glucose, Bld: 115 mg/dL — ABNORMAL HIGH (ref 65–99)
POTASSIUM: 4.5 mmol/L (ref 3.5–5.1)
Sodium: 130 mmol/L — ABNORMAL LOW (ref 135–145)
Total Bilirubin: 1.4 mg/dL — ABNORMAL HIGH (ref 0.3–1.2)
Total Protein: 5.5 g/dL — ABNORMAL LOW (ref 6.5–8.1)

## 2018-04-19 LAB — CBC
HCT: 23.5 % — ABNORMAL LOW (ref 39.0–52.0)
Hemoglobin: 8.1 g/dL — ABNORMAL LOW (ref 13.0–17.0)
MCH: 33.2 pg (ref 26.0–34.0)
MCHC: 34.5 g/dL (ref 30.0–36.0)
MCV: 96.3 fL (ref 78.0–100.0)
PLATELETS: 87 10*3/uL — AB (ref 150–400)
RBC: 2.44 MIL/uL — ABNORMAL LOW (ref 4.22–5.81)
RDW: 11.8 % (ref 11.5–15.5)
WBC: 7.5 10*3/uL (ref 4.0–10.5)

## 2018-04-19 NOTE — Progress Notes (Signed)
Physical Therapy Treatment Patient Details Name: Xavier Davis MRN: [id number] DOB: Sep 11, 1941 Today's Date: 04/19/2018    History of Present Illness 77 y.o. male admitted on 04/16/18 after falling from a ladder while trmming a tree.  He sustained a L rib fractures with pulmonary contusion, hemothorax, pneumatoceles, mesenteric hemorrhage (non surgical), T3 endplate compression fx with 7mm of retropulsion/non-displacedT9 endplate fx (treated with bracing).  Pt with significant PMH of HTN.  Pt s/p L CT insertion on 04/18/18.      PT Comments    Progressing well.  Emphasis today on transfer safety, gait stability/ stamina, and education.   Follow Up Recommendations  Home health PT;Supervision for mobility/OOB     Equipment Recommendations  Rolling walker with 5" wheels;3in1 (PT);Other (comment)    Recommendations for Other Services       Precautions / Restrictions Precautions Precautions: Back Required Braces or Orthoses: Spinal Brace;Cervical Brace Spinal Brace: Thoracolumbosacral orthotic;Other (comment) Spinal Brace Comments: when OOB.    Mobility  Bed Mobility               General bed mobility comments: oob in chair   Transfers Overall transfer level: Needs assistance Equipment used: Rolling walker (2 wheeled) Transfers: Sit to/from Stand Sit to Stand: Min assist;Min guard         General transfer comment: minimal stability while pt making transition of hands from chair to RW  Ambulation/Gait Ambulation/Gait assistance: Min guard Gait Distance (Feet): 180 Feet Assistive device: Rolling walker (2 wheeled) Gait Pattern/deviations: Step-through pattern Gait velocity: slower Gait velocity interpretation: <1.8 ft/sec, indicate of risk for recurrent falls General Gait Details: Steady gait with smooth manevering of the RW.  Sats on 6L maintain at 92%   Stairs             Wheelchair Mobility    Modified Rankin (Stroke Patients Only)        Balance     Sitting balance-Leahy Scale: Fair     Standing balance support: Bilateral upper extremity supported Standing balance-Leahy Scale: Fair                              Cognition Arousal/Alertness: Awake/alert Behavior During Therapy: WFL for tasks assessed/performed Overall Cognitive Status: Within Functional Limits for tasks assessed                                        Exercises      General Comments General comments (skin integrity, edema, etc.): Reinforced  all back/cervical education, log roll technique, bracing issues,  importance of getting staff to assist with RW and importance of incentive spirometry      Pertinent Vitals/Pain Pain Assessment: Faces Faces Pain Scale: Hurts little more Pain Location: back and left side of chest Pain Descriptors / Indicators: Grimacing;Guarding Pain Intervention(s): Monitored during session    Home Living                      Prior Function            PT Goals (current goals can now be found in the care plan section) Acute Rehab PT Goals Patient Stated Goal: to go home at discharge PT Goal Formulation: With patient Time For Goal Achievement: 05/01/18 Potential to Achieve Goals: Good Progress towards PT goals: Progressing toward goals    Frequency  Min 5X/week      PT Plan Current plan remains appropriate    Co-evaluation              AM-PAC PT "6 Clicks" Daily Activity  Outcome Measure  Difficulty turning over in bed (including adjusting bedclothes, sheets and blankets)?: A Little Difficulty moving from lying on back to sitting on the side of the bed? : A Little Difficulty sitting down on and standing up from a chair with arms (e.g., wheelchair, bedside commode, etc,.)?: A Little Help needed moving to and from a bed to chair (including a wheelchair)?: A Little Help needed walking in hospital room?: A Little Help needed climbing 3-5 steps with a railing? : A  Little 6 Click Score: 18    End of Session Equipment Utilized During Treatment: Oxygen;Cervical collar;Back brace Activity Tolerance: Patient limited by pain;Patient tolerated treatment well Patient left: in chair;with call bell/phone within reach;with family/visitor present Nurse Communication: Mobility status PT Visit Diagnosis: Other abnormalities of gait and mobility (R26.89);Difficulty in walking, not elsewhere classified (R26.2);Pain Pain - Right/Left: Left Pain - part of body: (ribs)     Time: 1345-1415 PT Time Calculation (min) (ACUTE ONLY): 30 min  Charges:  $Gait Training: 8-22 mins $Therapeutic Activity: 8-22 mins                    G Codes:       2018/04/28  Donnella Sham, PT [phone]  (pager)   Tessie Fass Wanette Robison 2018-04-28, 3:17 PM

## 2018-04-19 NOTE — Progress Notes (Signed)
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 Mild renal insuffiencey  Anemia Thrombocytopenia   Fall from ladder 04/16/18 Left rib fractures (left 3-8th ribs) with pulmonary contusionand small hemothorax, pneumatoceles- multimodal pain control, IS, pulm toilet - PT/OT - S/P CT placement, 06/14, 200cc output in last 24hours - repeat CXR showed small left effusion, no PTX - repeat CXR in AM  Mesenteric hemorrhage with soft tissue stranding- abdominal exam with some TTP on left side but nothing to suggest acute surgical issue - serial abdominal exams, tolerating diet, having flatus, no abdominal pain - reg diet  Acute blood loss anemia - hold lovenox - Hg 8.1 down from 8.7 06/14  T3 endplate compression fracture with 57mm of retropulsion/Non-displaced T9 endplate fracture-  NS consult. TLSO brace, PT/OT, pain control- follow up 2 weeks  FEN: Regular diet, salt tabs for hyponatremia  VTE: SCDs, holding lovenox with ABL anemia ID: no abx indicated currently Follow up: Dr. Sherley Bounds - 2 weeks  Plan:  Continue IS, CT to suction,  Will need home health PT/OT, 24 hour supervision, encourage ambulation, SCD's at all times. AM labs and CXR   LOS: 3 days    Subjective: CC; L side rib pain  Pt states SOB with lying flat but improved breathing with sitting up. Pt is on 4L O2. No hx of home O2 use. Not a smoker. Pt is having intermittent bloody sputum. No abdominal pain and tolerating diet. No nausea or vomiting. Having flatus. No pain or swelling in calves b/l.   Objective: Vital signs in last 24 hours: Temp:  [97.7 F (36.5 C)-98.7 F (37.1 C)] 98.6 F (37 C) (06/15 0733) Pulse Rate:  [64-92] 85 (06/15 0733) Resp:  [6-22] 18 (06/15 0733) BP: (111-156)/(53-68) 130/55 (06/15 0733) SpO2:  [91 %-98 %] 95 %  (06/15 0733) Last BM Date: 04/16/18  Intake/Output from previous day: 06/14 0701 - 06/15 0700 In: 1200 [P.O.:1200] Out: 1000 [Urine:800; Chest Tube:200] Intake/Output this shift: No intake/output data recorded.  PE: Gen:  Alert, NAD, pleasant, cooperative Card:  RRR, no M/G/R heard Pulm:  diminished breath sounds L base with crackles, rate and effort normal. On 4L O2. Pulling 750cc on IS. No air leak of CT. Output sanguinous  Abd: Soft, NT/ND Extremities: no TTP or swelling to b/l calves Skin: no rashes noted, warm and dry   Anti-infectives: Anti-infectives (From admission, onward)   None      Lab Results:  Recent Labs    04/18/18 1807 04/19/18 0436  WBC 10.4 7.5  HGB 8.7* 8.1*  HCT 25.5* 23.5*  PLT 85* 87*   BMET Recent Labs    04/18/18 0435 04/19/18 0436  NA 129* 130*  K 4.1 4.5  CL 103 101  CO2 22 22  GLUCOSE 158* 115*  BUN 25* 21*  CREATININE 1.47* 1.34*  CALCIUM 7.8* 8.6*   PT/INR Recent Labs    04/16/18 1140  LABPROT 13.7  INR 1.06   CMP     Component Value Date/Time   NA 130 (L) 04/19/2018 0436   K 4.5 04/19/2018 0436   CL 101 04/19/2018 0436   CO2 22 04/19/2018 0436   GLUCOSE 115 (H) 04/19/2018 0436   BUN 21 (H) 04/19/2018 0436   CREATININE 1.34 (H) 04/19/2018 0436   CALCIUM 8.6 (L) 04/19/2018 2595  PROT 5.5 (L) 04/19/2018 0436   ALBUMIN 3.2 (L) 04/19/2018 0436   AST 30 04/19/2018 0436   ALT 33 04/19/2018 0436   ALKPHOS 36 (L) 04/19/2018 0436   BILITOT 1.4 (H) 04/19/2018 0436   GFRNONAA 50 (L) 04/19/2018 0436   GFRAA 58 (L) 04/19/2018 0436   Lipase  No results found for: LIPASE  Studies/Results: Dg Chest Port 1 View  Result Date: 04/19/2018 CLINICAL DATA:  Hemothorax on left.  Chest tube placement EXAM: PORTABLE CHEST 1 VIEW COMPARISON:  04/18/2018 FINDINGS: Pigtail catheter on the left is unchanged. Small left effusion. No pneumothorax Diffuse bilateral airspace disease is unchanged. Decreased lung volume compared with the  prior study. Bibasilar atelectasis unchanged IMPRESSION: Left chest tube in place.  Small left effusion.  No pneumothorax Decreased lung volume compared to the prior study. Bilateral airspace disease unchanged. Electronically Signed   By: Franchot Gallo M.D.   On: 04/19/2018 07:31   Dg Chest Port 1 View  Result Date: 04/18/2018 CLINICAL DATA:  Chest tube placement, checking for hemothorax. EXAM: PORTABLE CHEST 1 VIEW COMPARISON:  Portable film earlier in the day. FINDINGS: LEFT pigtail chest catheter has been placed and lies along the medial hemithorax, at the aortic arch level. LEFT effusion/hemothorax is reduced. There is no pneumothorax. Multiple LEFT-sided rib fractures. IMPRESSION: Decreased LEFT effusion/hemothorax. Electronically Signed   By: Staci Righter M.D.   On: 04/18/2018 13:37   Dg Chest Port 1 View  Result Date: 04/18/2018 CLINICAL DATA:  Chest trauma. EXAM: PORTABLE CHEST 1 VIEW COMPARISON:  04/17/2018.  CT 04/16/2018. FINDINGS: Mediastinum and heart size are stable. Vascular stent noted over the left neck. Persistent left base atelectasis and infiltrate/contusion. Small left pleural effusion again noted. No pneumothorax P left-sided rib fractures again noted. IMPRESSION: 1.  Left rib fractures are again noted.  No pneumothorax. 2. Persistent left base atelectasis and infiltrate/contusion. Persistent small left pleural effusion. Electronically Signed   By: Marcello Moores  Register   On: 04/18/2018 06:52      Kalman Drape , [facility] 04/19/2018, 10:20 AM  Pager: [phone] Mon-Wed, Friday 7:00am-4:30pm Thurs 7am-11:30am  Consults: [phone] .

## 2018-04-19 NOTE — Progress Notes (Signed)
Throughout the night, the pt slept well and pain was controlled by Tramadol until morning bath was given. Morphine given at this time. Pt began to cough again and had SOB. Administered breathing tx and pt states it is easier to breathe. Chest tube had an output of 110 mL throughout the shift.

## 2018-04-19 NOTE — Progress Notes (Signed)
Subjective: Patient reports back pain improving and tolerating brace  Objective: Vital signs in last 24 hours: Temp:  [97.7 F (36.5 C)-98.7 F (37.1 C)] 98.6 F (37 C) (06/15 0733) Pulse Rate:  [64-92] 85 (06/15 0733) Resp:  [6-22] 18 (06/15 0733) BP: (111-156)/(53-68) 130/55 (06/15 0733) SpO2:  [91 %-98 %] 95 % (06/15 0733)  Intake/Output from previous day: 06/14 0701 - 06/15 0700 In: 1200 [P.O.:1200] Out: 1000 [Urine:800; Chest Tube:200] Intake/Output this shift: No intake/output data recorded.  Physical Exam: Laying in bed.  Has been up in brace earlier.  Lab Results: Recent Labs    04/18/18 1807 04/19/18 0436  WBC 10.4 7.5  HGB 8.7* 8.1*  HCT 25.5* 23.5*  PLT 85* 87*   BMET Recent Labs    04/18/18 0435 04/19/18 0436  NA 129* 130*  K 4.1 4.5  CL 103 101  CO2 22 22  GLUCOSE 158* 115*  BUN 25* 21*  CREATININE 1.47* 1.34*  CALCIUM 7.8* 8.6*    Studies/Results: Dg Chest Port 1 View  Result Date: 04/19/2018 CLINICAL DATA:  Hemothorax on left.  Chest tube placement EXAM: PORTABLE CHEST 1 VIEW COMPARISON:  04/18/2018 FINDINGS: Pigtail catheter on the left is unchanged. Small left effusion. No pneumothorax Diffuse bilateral airspace disease is unchanged. Decreased lung volume compared with the prior study. Bibasilar atelectasis unchanged IMPRESSION: Left chest tube in place.  Small left effusion.  No pneumothorax Decreased lung volume compared to the prior study. Bilateral airspace disease unchanged. Electronically Signed   By: Franchot Gallo M.D.   On: 04/19/2018 07:31   Dg Chest Port 1 View  Result Date: 04/18/2018 CLINICAL DATA:  Chest tube placement, checking for hemothorax. EXAM: PORTABLE CHEST 1 VIEW COMPARISON:  Portable film earlier in the day. FINDINGS: LEFT pigtail chest catheter has been placed and lies along the medial hemithorax, at the aortic arch level. LEFT effusion/hemothorax is reduced. There is no pneumothorax. Multiple LEFT-sided rib fractures.  IMPRESSION: Decreased LEFT effusion/hemothorax. Electronically Signed   By: Staci Righter M.D.   On: 04/18/2018 13:37   Dg Chest Port 1 View  Result Date: 04/18/2018 CLINICAL DATA:  Chest trauma. EXAM: PORTABLE CHEST 1 VIEW COMPARISON:  04/17/2018.  CT 04/16/2018. FINDINGS: Mediastinum and heart size are stable. Vascular stent noted over the left neck. Persistent left base atelectasis and infiltrate/contusion. Small left pleural effusion again noted. No pneumothorax P left-sided rib fractures again noted. IMPRESSION: 1.  Left rib fractures are again noted.  No pneumothorax. 2. Persistent left base atelectasis and infiltrate/contusion. Persistent small left pleural effusion. Electronically Signed   By: Marcello Moores  Register   On: 04/18/2018 06:52    Assessment/Plan: Patient is progressing.  Back pain better in brace.  Please call with any questions or concerns.    LOS: 3 days    Peggyann Shoals, MD 04/19/2018, 8:31 AM

## 2018-04-20 ENCOUNTER — Encounter (HOSPITAL_COMMUNITY): Payer: Self-pay | Admitting: Physician Assistant

## 2018-04-20 ENCOUNTER — Inpatient Hospital Stay (HOSPITAL_COMMUNITY): Payer: Medicare Other

## 2018-04-20 ENCOUNTER — Other Ambulatory Visit (HOSPITAL_COMMUNITY): Payer: Medicare Other

## 2018-04-20 DIAGNOSIS — R0602 Shortness of breath: Secondary | ICD-10-CM

## 2018-04-20 DIAGNOSIS — I480 Paroxysmal atrial fibrillation: Secondary | ICD-10-CM

## 2018-04-20 LAB — BASIC METABOLIC PANEL
ANION GAP: 6 (ref 5–15)
BUN: 24 mg/dL — ABNORMAL HIGH (ref 6–20)
CHLORIDE: 99 mmol/L — AB (ref 101–111)
CO2: 24 mmol/L (ref 22–32)
Calcium: 8.4 mg/dL — ABNORMAL LOW (ref 8.9–10.3)
Creatinine, Ser: 1.33 mg/dL — ABNORMAL HIGH (ref 0.61–1.24)
GFR calc Af Amer: 58 mL/min — ABNORMAL LOW (ref >60)
GFR, EST NON AFRICAN AMERICAN: 50 mL/min — AB (ref >60)
GLUCOSE: 155 mg/dL — AB (ref 65–99)
POTASSIUM: 4.2 mmol/L (ref 3.5–5.1)
Sodium: 129 mmol/L — ABNORMAL LOW (ref 135–145)

## 2018-04-20 LAB — PREPARE RBC (CROSSMATCH)

## 2018-04-20 LAB — CBC
HEMATOCRIT: 22 % — AB (ref 39.0–52.0)
HEMOGLOBIN: 7.8 g/dL — AB (ref 13.0–17.0)
MCH: 33.5 pg (ref 26.0–34.0)
MCHC: 35.5 g/dL (ref 30.0–36.0)
MCV: 94.4 fL (ref 78.0–100.0)
Platelets: 90 10*3/uL — ABNORMAL LOW (ref 150–400)
RBC: 2.33 MIL/uL — ABNORMAL LOW (ref 4.22–5.81)
RDW: 11.6 % (ref 11.5–15.5)
WBC: 8.3 10*3/uL (ref 4.0–10.5)

## 2018-04-20 MED ORDER — AMLODIPINE BESYLATE 5 MG PO TABS: 5.0000 mg | ORAL_TABLET | Freq: Every day | ORAL | Status: DC

## 2018-04-20 MED ORDER — METOPROLOL TARTRATE 5 MG/5ML IV SOLN
5.0000 mg | INTRAVENOUS | Status: AC | PRN
  Administered 2018-04-20 (×2): 5 mg via INTRAVENOUS
  Filled 2018-04-20 (×2): qty 5

## 2018-04-20 MED ORDER — SODIUM CHLORIDE 0.9 % IV SOLN
Freq: Once | INTRAVENOUS | Status: AC
  Administered 2018-04-20: 10:00:00 via INTRAVENOUS

## 2018-04-20 MED ORDER — LISINOPRIL 20 MG PO TABS: 30.0000 mg | ORAL_TABLET | Freq: Every day | ORAL | Status: DC

## 2018-04-20 NOTE — Consult Note (Addendum)
Cardiology Consultation:   Patient ID: Xavier Davis; [id number]; November 23, 1940   Admit date: 04/16/2018 Date of Consult: 04/20/2018  Primary Care Provider: Patient, No Pcp Per Primary Cardiologist: New to [facility], will be followed at [facility] Vascular surgery: Dr. Lucky Cowboy  Patient Profile:   Xavier Davis is a 77 y.o. male with a hx of hypertension, hyperlipidemia and status post left carotid endarterectomy in 2014 (followed by Dr. Lucky Cowboy)  who is being seen today for the evaluation of atrial fibrillation  at the request of Dr. Georgette Dover.   No prior history of cardiac catheterization or stroke.  Denies tobacco abuse.   History of Present Illness:   [name] admitted 6/12 after fell from ladder.  He was trying to step down.  No prodromal symptoms or loss of consciousness.  Work-up revealed multiple broken ribs on left side with pulmonary contusion status post chest tube placement.  No pneumothorax.  Also noted to have T-spine compression fracture that was treated conservatively with TLSO.  He also had mesenteric contusion.  His antihypertensive held during admission due to soft low blood pressure.  Hemoglobin dropping to 7.8 today.  Plan for transfusion.  This morning he went into atrial fibrillation at rate of 100-110's.  Given IV metoprolol 5 mg x 2.  Converted to sinus rhythm.  No prior history of atrial fibrillation.  Patient works as a Animator.  He denied any exertional chest pain/pressure.  However son has noted recent shortness of breath.  Patient endorsed slowing down recently.  EKG on admission 6/12 showed sinus bradycardia at rate of 48 bpm, right bundle branch block and T wave inversion laterally- personally reviewed.  EKG this morning showed atrial fibrillation at rate of 108 bpm and ST depression laterally-Personally reviewed.  Past Medical History:  Diagnosis Date  . Carotid artery disease ([place])    s/p left carotid endarterectomy at [facility] in 2014  . Hyperlipidemia   . Hypertension       Past Surgical History:  Procedure Laterality Date  . APPENDECTOMY      Inpatient Medications: Scheduled Meds: . acetaminophen  1,000 mg Oral Q8H  . citalopram  40 mg Oral Daily  . docusate sodium  100 mg Oral BID  . finasteride  5 mg Oral Daily  . methocarbamol  500 mg Oral TID  . pantoprazole  40 mg Oral Q24H  . tamsulosin  0.4 mg Oral Daily  . traZODone  50 mg Oral QHS   Continuous Infusions:  PRN Meds: albuterol, metoprolol tartrate, morphine injection, ondansetron **OR** ondansetron (ZOFRAN) IV, oxyCODONE, traMADol  Allergies:   No Known Allergies  Social History:   Social History   Socioeconomic History  . Marital status: Married    Spouse name: Not on file  . Number of children: Not on file  . Years of education: Not on file  . Highest education level: Not on file  Occupational History  . Not on file  Social Needs  . Financial resource strain: Not on file  . Food insecurity:    Worry: Not on file    Inability: Not on file  . Transportation needs:    Medical: Not on file    Non-medical: Not on file  Tobacco Use  . Smoking status: Former Research scientist (life sciences)  . Smokeless tobacco: Never Used  Substance and Sexual Activity  . Alcohol use: Never    Frequency: Never  . Drug use: Never  . Sexual activity: Not on file  Lifestyle  . Physical activity:  Days per week: Not on file    Minutes per session: Not on file  . Stress: Not on file  Relationships  . Social connections:    Talks on phone: Not on file    Gets together: Not on file    Attends religious service: Not on file    Active member of club or organization: Not on file    Attends meetings of clubs or organizations: Not on file    Relationship status: Not on file  . Intimate partner violence:    Fear of current or ex partner: Not on file    Emotionally abused: Not on file    Physically abused: Not on file    Forced sexual activity: Not on file  Other Topics Concern  . Not on file  Social History  Narrative  . Not on file    Family History:   Family History  Problem Relation Age of Onset  . Stroke Father      ROS:  Please see the history of present illness.  All other ROS reviewed and negative.     Physical Exam/Data:   Vitals:   04/20/18 1015 04/20/18 1030 04/20/18 1045 04/20/18 1100  BP: 128/76 (!) 129/57 132/60 (!) 141/57  Pulse: 84 78 78 80  Resp: 15 19 17  (!) 21  Temp: 98.7 F (37.1 C) 99.2 F (37.3 C) 99.3 F (37.4 C)   TempSrc: Oral Oral Oral   SpO2: 95% 97% 96% 95%  Weight:      Height:        Intake/Output Summary (Last 24 hours) at 04/20/2018 1131 Last data filed at 04/20/2018 0700 Gross per 24 hour  Intake 240 ml  Output 1045 ml  Net -805 ml   Filed Weights   04/16/18 1209 04/16/18 1916  Weight: 150 lb (68 kg) 155 lb 3.3 oz (70.4 kg)   Body mass index is 24.31 kg/m.  General:  Well nourished, well developed, in no acute distress HEENT: normal Lymph: no adenopathy Neck: no JVD Endocrine:  No thryomegaly Vascular: No carotid bruits; FA pulses 2+ bilaterally without bruits  Cardiac:  normal S1, S2; RRR; no murmur  Lungs: Diminished breath sounds with left-sided chest tube Abd: soft, nontender, no hepatomegaly  Ext: no edema Musculoskeletal:  No deformities, BUE and BLE strength normal and equal Skin: warm and dry  Neuro:  CNs 2-12 intact, no focal abnormalities noted Psych:  Normal affect   Telemetry:  Telemetry was personally reviewed and demonstrates:  Sinus rhythm at rate of 70s  Relevant CV Studies: As above  Laboratory Data:  Chemistry Recent Labs  Lab 04/18/18 0435 04/19/18 0436 04/20/18 0401  NA 129* 130* 129*  K 4.1 4.5 4.2  CL 103 101 99*  CO2 22 22 24   GLUCOSE 158* 115* 155*  BUN 25* 21* 24*  CREATININE 1.47* 1.34* 1.33*  CALCIUM 7.8* 8.6* 8.4*  GFRNONAA 45* 50* 50*  GFRAA 52* 58* 58*  ANIONGAP 4* 7 6    Recent Labs  Lab 04/16/18 1140 04/19/18 0436  PROT 6.1* 5.5*  ALBUMIN 3.8 3.2*  AST 52* 30  ALT 45 33    ALKPHOS 49 36*  BILITOT 0.9 1.4*   Hematology Recent Labs  Lab 04/18/18 1807 04/19/18 0436 04/20/18 0401  WBC 10.4 7.5 8.3  RBC 2.59* 2.44* 2.33*  HGB 8.7* 8.1* 7.8*  HCT 25.5* 23.5* 22.0*  MCV 98.5 96.3 94.4  MCH 33.6 33.2 33.5  MCHC 34.1 34.5 35.5  RDW 11.9 11.8 11.6  PLT 85* 87* 90*   Cardiac EnzymesNo results for input(s): TROPONINI in the last 168 hours. No results for input(s): TROPIPOC in the last 168 hours.  BNPNo results for input(s): BNP, PROBNP in the last 168 hours.  DDimer No results for input(s): DDIMER in the last 168 hours.  Radiology/Studies:  Ct Abdomen Pelvis Wo Contrast  Result Date: 04/16/2018 CLINICAL DATA:  77 year old male with history of trauma after falling 20 feet off of a ladder landing onto his left side. EXAM: CT CHEST, ABDOMEN AND PELVIS WITHOUT CONTRAST TECHNIQUE: Multidetector CT imaging of the chest, abdomen and pelvis was performed following the standard protocol without IV contrast. COMPARISON:  No priors. FINDINGS: CT CHEST FINDINGS Cardiovascular: Heart size is normal. There is no significant pericardial fluid, thickening or pericardial calcification. There is aortic atherosclerosis, as well as atherosclerosis of the great vessels of the mediastinum and the coronary arteries, including calcified atherosclerotic plaque in the left main, left anterior descending, left circumflex and right coronary arteries. Mediastinum/Nodes: Small amount of high attenuation in the posterior mediastinum adjacent to the T3 compression fracture (discussed below), compatible with a small amount of paravertebral hemorrhage. No other high attenuation fluid collection noted within the mediastinum to suggest posttraumatic hemorrhage. No pathologically enlarged mediastinal or hilar lymph nodes. Insert non-contrast hilar disclaimer esophagus is unremarkable in appearance. No axillary lymphadenopathy. Lungs/Pleura: Trace medial left pneumothorax best appreciated on axial images  18 and 19 of series 5. No right pneumothorax. Large amount of airspace consolidation in the left lower lobe, concerning for posttraumatic contusion. Several internal lucencies within this region likely reflect posttraumatic pneumatoceles. Minimal dependent atelectasis in the right lower lobe. Trace amount of high attenuation left pleural fluid, indicative of trace hemothorax. Subpleural thickening in the periphery of the left hemithorax, likely to reflect a small amount of subpleural hemorrhage from multiple left-sided rib fractures (discussed below). Musculoskeletal: Acute compression fracture of superior endplate of T3 with approximately 30% loss of anterior vertebral body height and 4 mm of retropulsion of fracture fragments impinging slightly upon the thoracic spinal canal. Small amount of paravertebral hemorrhage in the posterior mediastinum (discussed above). Acute mildly displaced fractures of the posterolateral aspect of the left sixth and seventh ribs. Several other nondisplaced left-sided rib fractures (anterolateral left third rib, anterolateral left fourth rib, anterolateral left fifth rib, anterolateral left sixth rib, lateral left seventh rib posterolateral left eighth rib, posterolateral left ninth rib, and posterolateral left tenth rib). Acute nondisplaced fracture through superior endplate of T9. CT ABDOMEN PELVIS FINDINGS Hepatobiliary: No definite evidence of significant acute traumatic injury to the liver on today's noncontrast CT examination. No definite cystic or solid hepatic lesion. Unenhanced appearance of the gallbladder is normal. Pancreas: No definite evidence of acute traumatic injury to the pancreas on today's noncontrast CT examination. No definite pancreatic mass or peripancreatic fluid or inflammatory changes. Spleen: Unremarkable. Specifically, no definite signs of acute traumatic injury to the spleen. Adrenals/Urinary Tract: No definite evidence of acute traumatic injury to either  kidney on today's noncontrast CT examination. Mild left renal atrophy. No hydroureteronephrosis. Unenhanced appearance of the urinary bladder is normal. Specifically, urinary bladder appears intact. Left adrenal gland is normal in appearance. Right adrenal gland is remarkable for a 2.2 x 1.7 cm low-attenuation (2 HU) nodule, compatible with an adenoma. Stomach/Bowel: Unenhanced appearance of the stomach is normal. No pathologic dilatation of small bowel or colon. However, there is extensive soft tissue stranding throughout the small bowel mesentery. In addition, there is a region of high attenuation  in the root of the small bowel mesentery best appreciated on axial image 88 of series 4 and coronal image 33 of series 7, which is most compatible with mesenteric hemorrhage. Numerous colonic diverticulae are noted, without surrounding inflammatory changes to suggest an acute diverticulitis at this time. The appendix is not confidently identified and may be surgically absent. Regardless, there are no inflammatory changes noted adjacent to the cecum to suggest the presence of an acute appendicitis at this time. Vascular/Lymphatic: Aortic atherosclerosis. No lymphadenopathy noted in the abdomen or pelvis. Reproductive: Prostate gland and seminal vesicles are unremarkable in appearance. Other: Small amount of low-attenuation ascites in the low anatomic pelvis. No large high attenuation fluid collection in the retroperitoneum to suggest retroperitoneal hemorrhage. While there is no definite evidence of intraperitoneal hemorrhage, there does appear to be mesenteric hemorrhage, as above. Musculoskeletal: No acute displaced fractures or aggressive appearing lytic or blastic lesions are noted in the visualized portions of the skeleton. IMPRESSION: 1. Multiple displaced and nondisplaced left-sided rib fractures with posttraumatic contusion and pneumatocele is a in the left lower lobe, as well as a very small left hemopneumothorax  (trace pneumothorax component and trace amount of pleural hemorrhage). 2. Acute compression fracture of superior endplate of T3 with 4 mm of retropulsion of fracture fragments slightly impinging upon the central spinal canal. There is also a nondisplaced fracture through superior endplate of T9 vertebral body. 3. Acute hemorrhage into the root of the small bowel mesentery, as discussed above. 4. Aortic atherosclerosis, in addition to left main and 3 vessel coronary artery disease. Assessment for potential risk factor modification, dietary therapy or pharmacologic therapy may be warranted, if clinically indicated. 5. Colonic diverticulosis without evidence of acute diverticulitis at this time. 6. Additional incidental findings, as above. These results were called by telephone at the time of interpretation on 04/16/2018 at 2:21 pm to Dr. Sherwood Gambler, who verbally acknowledged these results. Aortic Atherosclerosis (ICD10-I70.0). Electronically Signed   By: Vinnie Langton M.D.   On: 04/16/2018 14:21   Ct Head Wo Contrast  Result Date: 04/16/2018 CLINICAL DATA:  20 foot fall off a ladder. Denies head or neck pain. EXAM: CT HEAD WITHOUT CONTRAST CT CERVICAL SPINE WITHOUT CONTRAST TECHNIQUE: Multidetector CT imaging of the head and cervical spine was performed following the standard protocol without intravenous contrast. Multiplanar CT image reconstructions of the cervical spine were also generated. COMPARISON:  None. FINDINGS: CT HEAD FINDINGS Brain: No evidence of acute infarction, hemorrhage, hydrocephalus, extra-axial collection or mass lesion/mass effect. Mild to moderate generalized cerebral atrophy. Vascular: Calcified atherosclerosis at the skullbase. No hyperdense vessel. Skull: Normal. Negative for fracture or focal lesion. Sinuses/Orbits: No acute finding. Other: None. CT CERVICAL SPINE FINDINGS Alignment: Normal. Skull base and vertebrae: No acute fracture. No primary bone lesion or focal pathologic  process. Soft tissues and spinal canal: No prevertebral fluid or swelling. No visible canal hematoma. Disc levels: Mild-to-moderate disc height loss and facet uncovertebral hypertrophy throughout the cervical spine. Upper chest: Negative. Other: Nondisplaced fracture of the left posterior third rib at the costovertebral junction. Small foci of subcutaneous emphysema in the posterior left chest wall. Left carotid artery stent. Surgical clips in the left anterior neck. IMPRESSION: 1.  No acute intracranial abnormality. 2.  No acute cervical spine fracture. 3. Nondisplaced fracture of the left posterior third rib at the costovertebral junction. Small foci of subcutaneous emphysema in the posterior left chest wall. Please see separate CT chest report for further details. Electronically Signed   By: Gwyndolyn Saxon  Marzella Schlein M.D.   On: 04/16/2018 14:10   Ct Chest Wo Contrast  Result Date: 04/16/2018 CLINICAL DATA:  77 year old male with history of trauma after falling 20 feet off of a ladder landing onto his left side. EXAM: CT CHEST, ABDOMEN AND PELVIS WITHOUT CONTRAST TECHNIQUE: Multidetector CT imaging of the chest, abdomen and pelvis was performed following the standard protocol without IV contrast. COMPARISON:  No priors. FINDINGS: CT CHEST FINDINGS Cardiovascular: Heart size is normal. There is no significant pericardial fluid, thickening or pericardial calcification. There is aortic atherosclerosis, as well as atherosclerosis of the great vessels of the mediastinum and the coronary arteries, including calcified atherosclerotic plaque in the left main, left anterior descending, left circumflex and right coronary arteries. Mediastinum/Nodes: Small amount of high attenuation in the posterior mediastinum adjacent to the T3 compression fracture (discussed below), compatible with a small amount of paravertebral hemorrhage. No other high attenuation fluid collection noted within the mediastinum to suggest posttraumatic  hemorrhage. No pathologically enlarged mediastinal or hilar lymph nodes. Insert non-contrast hilar disclaimer esophagus is unremarkable in appearance. No axillary lymphadenopathy. Lungs/Pleura: Trace medial left pneumothorax best appreciated on axial images 18 and 19 of series 5. No right pneumothorax. Large amount of airspace consolidation in the left lower lobe, concerning for posttraumatic contusion. Several internal lucencies within this region likely reflect posttraumatic pneumatoceles. Minimal dependent atelectasis in the right lower lobe. Trace amount of high attenuation left pleural fluid, indicative of trace hemothorax. Subpleural thickening in the periphery of the left hemithorax, likely to reflect a small amount of subpleural hemorrhage from multiple left-sided rib fractures (discussed below). Musculoskeletal: Acute compression fracture of superior endplate of T3 with approximately 30% loss of anterior vertebral body height and 4 mm of retropulsion of fracture fragments impinging slightly upon the thoracic spinal canal. Small amount of paravertebral hemorrhage in the posterior mediastinum (discussed above). Acute mildly displaced fractures of the posterolateral aspect of the left sixth and seventh ribs. Several other nondisplaced left-sided rib fractures (anterolateral left third rib, anterolateral left fourth rib, anterolateral left fifth rib, anterolateral left sixth rib, lateral left seventh rib posterolateral left eighth rib, posterolateral left ninth rib, and posterolateral left tenth rib). Acute nondisplaced fracture through superior endplate of T9. CT ABDOMEN PELVIS FINDINGS Hepatobiliary: No definite evidence of significant acute traumatic injury to the liver on today's noncontrast CT examination. No definite cystic or solid hepatic lesion. Unenhanced appearance of the gallbladder is normal. Pancreas: No definite evidence of acute traumatic injury to the pancreas on today's noncontrast CT  examination. No definite pancreatic mass or peripancreatic fluid or inflammatory changes. Spleen: Unremarkable. Specifically, no definite signs of acute traumatic injury to the spleen. Adrenals/Urinary Tract: No definite evidence of acute traumatic injury to either kidney on today's noncontrast CT examination. Mild left renal atrophy. No hydroureteronephrosis. Unenhanced appearance of the urinary bladder is normal. Specifically, urinary bladder appears intact. Left adrenal gland is normal in appearance. Right adrenal gland is remarkable for a 2.2 x 1.7 cm low-attenuation (2 HU) nodule, compatible with an adenoma. Stomach/Bowel: Unenhanced appearance of the stomach is normal. No pathologic dilatation of small bowel or colon. However, there is extensive soft tissue stranding throughout the small bowel mesentery. In addition, there is a region of high attenuation in the root of the small bowel mesentery best appreciated on axial image 88 of series 4 and coronal image 33 of series 7, which is most compatible with mesenteric hemorrhage. Numerous colonic diverticulae are noted, without surrounding inflammatory changes to suggest an acute diverticulitis  at this time. The appendix is not confidently identified and may be surgically absent. Regardless, there are no inflammatory changes noted adjacent to the cecum to suggest the presence of an acute appendicitis at this time. Vascular/Lymphatic: Aortic atherosclerosis. No lymphadenopathy noted in the abdomen or pelvis. Reproductive: Prostate gland and seminal vesicles are unremarkable in appearance. Other: Small amount of low-attenuation ascites in the low anatomic pelvis. No large high attenuation fluid collection in the retroperitoneum to suggest retroperitoneal hemorrhage. While there is no definite evidence of intraperitoneal hemorrhage, there does appear to be mesenteric hemorrhage, as above. Musculoskeletal: No acute displaced fractures or aggressive appearing lytic or  blastic lesions are noted in the visualized portions of the skeleton. IMPRESSION: 1. Multiple displaced and nondisplaced left-sided rib fractures with posttraumatic contusion and pneumatocele is a in the left lower lobe, as well as a very small left hemopneumothorax (trace pneumothorax component and trace amount of pleural hemorrhage). 2. Acute compression fracture of superior endplate of T3 with 4 mm of retropulsion of fracture fragments slightly impinging upon the central spinal canal. There is also a nondisplaced fracture through superior endplate of T9 vertebral body. 3. Acute hemorrhage into the root of the small bowel mesentery, as discussed above. 4. Aortic atherosclerosis, in addition to left main and 3 vessel coronary artery disease. Assessment for potential risk factor modification, dietary therapy or pharmacologic therapy may be warranted, if clinically indicated. 5. Colonic diverticulosis without evidence of acute diverticulitis at this time. 6. Additional incidental findings, as above. These results were called by telephone at the time of interpretation on 04/16/2018 at 2:21 pm to Dr. Sherwood Gambler, who verbally acknowledged these results. Aortic Atherosclerosis (ICD10-I70.0). Electronically Signed   By: Vinnie Langton M.D.   On: 04/16/2018 14:21   Ct Cervical Spine Wo Contrast  Result Date: 04/16/2018 CLINICAL DATA:  20 foot fall off a ladder. Denies head or neck pain. EXAM: CT HEAD WITHOUT CONTRAST CT CERVICAL SPINE WITHOUT CONTRAST TECHNIQUE: Multidetector CT imaging of the head and cervical spine was performed following the standard protocol without intravenous contrast. Multiplanar CT image reconstructions of the cervical spine were also generated. COMPARISON:  None. FINDINGS: CT HEAD FINDINGS Brain: No evidence of acute infarction, hemorrhage, hydrocephalus, extra-axial collection or mass lesion/mass effect. Mild to moderate generalized cerebral atrophy. Vascular: Calcified atherosclerosis  at the skullbase. No hyperdense vessel. Skull: Normal. Negative for fracture or focal lesion. Sinuses/Orbits: No acute finding. Other: None. CT CERVICAL SPINE FINDINGS Alignment: Normal. Skull base and vertebrae: No acute fracture. No primary bone lesion or focal pathologic process. Soft tissues and spinal canal: No prevertebral fluid or swelling. No visible canal hematoma. Disc levels: Mild-to-moderate disc height loss and facet uncovertebral hypertrophy throughout the cervical spine. Upper chest: Negative. Other: Nondisplaced fracture of the left posterior third rib at the costovertebral junction. Small foci of subcutaneous emphysema in the posterior left chest wall. Left carotid artery stent. Surgical clips in the left anterior neck. IMPRESSION: 1.  No acute intracranial abnormality. 2.  No acute cervical spine fracture. 3. Nondisplaced fracture of the left posterior third rib at the costovertebral junction. Small foci of subcutaneous emphysema in the posterior left chest wall. Please see separate CT chest report for further details. Electronically Signed   By: Titus Dubin M.D.   On: 04/16/2018 14:10   Dg Pelvis Portable  Result Date: 04/16/2018 CLINICAL DATA:  Trauma secondary to a fall from a ladder today. EXAM: PORTABLE PELVIS 1-2 VIEWS COMPARISON:  None. FINDINGS: There is no evidence of pelvic  fracture or diastasis. No pelvic bone lesions are seen. IMPRESSION: Negative. Electronically Signed   By: Lorriane Shire M.D.   On: 04/16/2018 12:10   Dg Chest Port 1 View  Result Date: 04/20/2018 CLINICAL DATA:  Hemothorax EXAM: PORTABLE CHEST 1 VIEW COMPARISON:  04/19/2018 FINDINGS: Left chest tube remains in place, unchanged. Heart is borderline in size. Diffuse interstitial prominence throughout the lungs slightly improved. Bibasilar atelectasis and small effusion. No pneumothorax. IMPRESSION: Left chest tube remains in place, unchanged. No visible pneumothorax. Diffuse interstitial prominence has  improved, likely improving edema. Bibasilar atelectasis and small left effusion. Electronically Signed   By: Rolm Baptise M.D.   On: 04/20/2018 07:35   Dg Chest Port 1 View  Result Date: 04/19/2018 CLINICAL DATA:  Hemothorax on left.  Chest tube placement EXAM: PORTABLE CHEST 1 VIEW COMPARISON:  04/18/2018 FINDINGS: Pigtail catheter on the left is unchanged. Small left effusion. No pneumothorax Diffuse bilateral airspace disease is unchanged. Decreased lung volume compared with the prior study. Bibasilar atelectasis unchanged IMPRESSION: Left chest tube in place.  Small left effusion.  No pneumothorax Decreased lung volume compared to the prior study. Bilateral airspace disease unchanged. Electronically Signed   By: Franchot Gallo M.D.   On: 04/19/2018 07:31   Dg Chest Port 1 View  Result Date: 04/18/2018 CLINICAL DATA:  Chest tube placement, checking for hemothorax. EXAM: PORTABLE CHEST 1 VIEW COMPARISON:  Portable film earlier in the day. FINDINGS: LEFT pigtail chest catheter has been placed and lies along the medial hemithorax, at the aortic arch level. LEFT effusion/hemothorax is reduced. There is no pneumothorax. Multiple LEFT-sided rib fractures. IMPRESSION: Decreased LEFT effusion/hemothorax. Electronically Signed   By: Staci Righter M.D.   On: 04/18/2018 13:37   Dg Chest Port 1 View  Result Date: 04/18/2018 CLINICAL DATA:  Chest trauma. EXAM: PORTABLE CHEST 1 VIEW COMPARISON:  04/17/2018.  CT 04/16/2018. FINDINGS: Mediastinum and heart size are stable. Vascular stent noted over the left neck. Persistent left base atelectasis and infiltrate/contusion. Small left pleural effusion again noted. No pneumothorax P left-sided rib fractures again noted. IMPRESSION: 1.  Left rib fractures are again noted.  No pneumothorax. 2. Persistent left base atelectasis and infiltrate/contusion. Persistent small left pleural effusion. Electronically Signed   By: Marcello Moores  Register   On: 04/18/2018 06:52   Dg Chest  Port 1 View  Result Date: 04/17/2018 CLINICAL DATA:  Pulmonary contusion. EXAM: PORTABLE CHEST 1 VIEW COMPARISON:  CT 04/16/2018. FINDINGS: Mediastinum is stable. Heart size stable. Persistent left lower lobe infiltrate/contusion. Small left pleural effusion. No pneumothorax. Vascular stent noted over the left neck. Multiple left-sided rib fractures again noted. Degenerative changes thoracic spine. IMPRESSION: 1. Multiple left-sided rib fractures are again noted. No pneumothorax. 2. Persistent left lower lobe infiltrate/contusion and small left pleural effusion. Electronically Signed   By: Marcello Moores  Register   On: 04/17/2018 07:37   Dg Chest Portable 1 View  Result Date: 04/16/2018 CLINICAL DATA:  Patient fell from a ladder. Diminished breath sounds. EXAM: PORTABLE CHEST 1 VIEW COMPARISON:  None. FINDINGS: There are multiple displaced left lateral rib fractures. There is no pneumothorax. Slight haziness in the left perihilar region. Heart size and pulmonary vascularity are normal. Vague 8 6 mm density overlies the anterior aspect of the right third rib. This probably represents a confluence of normal structures but could represent a tiny pulmonary nodule. Aortic atherosclerosis. IMPRESSION: 1. Multiple displaced left lateral rib fractures without a discrete pneumothorax or pleural effusion. Fractures of the left third through ninth  ribs. 2. Slight left perihilar haziness could represent lung contusion. 3. Possible small nodule in the right mid lung zone. 4.  Aortic Atherosclerosis (ICD10-I70.0). Electronically Signed   By: Lorriane Shire M.D.   On: 04/16/2018 11:59    Assessment and Plan:   1. New onset atrial fibrillation -Converted back to sinus rhythm after IV metoprolol 5 mg x 2.  Currently in sinus rhythm at rate of 70s.  He was sinus bradycardic on presentation. CHASVASC score of 4 for hypertension, age and vascular disease.  His A. fib likely exacerbated due to acute illness.  He is not  anticoagulation candidate currently.  If deferred anticoagulation, he will need to monitor as outpatient to rule out recurrent arrhythmia.  Will get echocardiogram.   2.  Abnormal EKG -T wave inversion in lateral leads noted.  Had more ST depression while went into atrial fibrillation.  Recently noted slowing down and shortness of breath with activity.  Pending echocardiogram.  Likely outpatient stress test.  3.  Hypertension -Antihypertensive held during admission due to soft low blood pressure.  Now blood pressure stable.  Will review medication with MD.  4.  Acute anemia -Plan for transfusion today.  5.  Fall/rib fractures/thoracic compression fractures/mesenteric hemorrhage -Status post chest tube placement.  Per admitting team   For questions or updates, please contact [place] Please consult [email or web address] for contact info under Cardiology/STEMI.   Jarrett Soho, PA  04/20/2018 11:31 AM   I have seen, examined the patient, and reviewed the above assessment and plan.  Changes to above are made where necessary.  On exam, RRR.  I agree that episode of afib was most likely due to his acute medical illness/ trauma.  Not a candidate for anticoagulation at this time.  Would use metoprolol prn for afib with RVR. If echo is low risk, would plan outpatient event monitor and stress testing once clinically improved.  Follow-up in 1 week in AF clinic  Co Sign: Thompson Grayer, MD 04/20/2018 12:35 PM

## 2018-04-20 NOTE — Progress Notes (Signed)
Notified Dr. Georgette Dover of patients temperature of 99.8, no other symptoms orders to keep the blood going.

## 2018-04-20 NOTE — Progress Notes (Signed)
Subjective/Chief Complaint: Patient with new onset atrial fibrillation this morning - HR 110-120 Has not been on BP meds due to borderline low BP - improved now Hgb trending down  C/o left side rib pain - no substernal chest pain Occasionally coughing up bloody sputum  Objective: Vital signs in last 24 hours: Temp:  [98.5 F (36.9 C)-99 F (37.2 C)] 99 F (37.2 C) (06/16 0735) Pulse Rate:  [71-130] 104 (06/16 0735) Resp:  [13-25] 18 (06/16 0735) BP: (121-154)/(53-82) 125/75 (06/16 0735) SpO2:  [94 %-100 %] 94 % (06/16 0735) Last BM Date: 04/16/18  Intake/Output from previous day: 06/15 0701 - 06/16 0700 In: 480 [P.O.:480] Out: 1045 [Urine:1000; Chest Tube:45] Intake/Output this shift: No intake/output data recorded.  Gen:  Alert, NAD, pleasant, cooperative Card:  Irregular, tachycardic, no M/G/R heard Pulm:  diminished breath sounds L base with crackles, rate and effort normal. On 4L O2. Pulling 750cc on IS. No air leak of CT. Output serosanguinous  Abd: Soft, NT/ND Extremities: no TTP or swelling to b/l calves Skin: no rashes noted, warm and dry    Lab Results:  Recent Labs    04/19/18 0436 04/20/18 0401  WBC 7.5 8.3  HGB 8.1* 7.8*  HCT 23.5* 22.0*  PLT 87* 90*   BMET Recent Labs    04/19/18 0436 04/20/18 0401  NA 130* 129*  K 4.5 4.2  CL 101 99*  CO2 22 24  GLUCOSE 115* 155*  BUN 21* 24*  CREATININE 1.34* 1.33*  CALCIUM 8.6* 8.4*   PT/INR No results for input(s): LABPROT, INR in the last 72 hours. ABG No results for input(s): PHART, HCO3 in the last 72 hours.  Invalid input(s): PCO2, PO2  Studies/Results: Dg Chest Port 1 View  Result Date: 04/20/2018 CLINICAL DATA:  Hemothorax EXAM: PORTABLE CHEST 1 VIEW COMPARISON:  04/19/2018 FINDINGS: Left chest tube remains in place, unchanged. Heart is borderline in size. Diffuse interstitial prominence throughout the lungs slightly improved. Bibasilar atelectasis and small effusion. No pneumothorax.  IMPRESSION: Left chest tube remains in place, unchanged. No visible pneumothorax. Diffuse interstitial prominence has improved, likely improving edema. Bibasilar atelectasis and small left effusion. Electronically Signed   By: Rolm Baptise M.D.   On: 04/20/2018 07:35   Dg Chest Port 1 View  Result Date: 04/19/2018 CLINICAL DATA:  Hemothorax on left.  Chest tube placement EXAM: PORTABLE CHEST 1 VIEW COMPARISON:  04/18/2018 FINDINGS: Pigtail catheter on the left is unchanged. Small left effusion. No pneumothorax Diffuse bilateral airspace disease is unchanged. Decreased lung volume compared with the prior study. Bibasilar atelectasis unchanged IMPRESSION: Left chest tube in place.  Small left effusion.  No pneumothorax Decreased lung volume compared to the prior study. Bilateral airspace disease unchanged. Electronically Signed   By: Franchot Gallo M.D.   On: 04/19/2018 07:31   Dg Chest Port 1 View  Result Date: 04/18/2018 CLINICAL DATA:  Chest tube placement, checking for hemothorax. EXAM: PORTABLE CHEST 1 VIEW COMPARISON:  Portable film earlier in the day. FINDINGS: LEFT pigtail chest catheter has been placed and lies along the medial hemithorax, at the aortic arch level. LEFT effusion/hemothorax is reduced. There is no pneumothorax. Multiple LEFT-sided rib fractures. IMPRESSION: Decreased LEFT effusion/hemothorax. Electronically Signed   By: Staci Righter M.D.   On: 04/18/2018 13:37    Anti-infectives: Anti-infectives (From admission, onward)   None      Assessment/Plan: HTN- hold meds in setting of hypotension, prn lopressor  HLD- hold home meds for now, will restart when tolerating  PO diet BPH - home meds Mild renal insuffiencey  Anemia Thrombocytopenia  Fall from ladder6/12/19 Left rib fractures(left 3-8th ribs)with pulmonary contusionand small hemothorax, pneumatoceles- multimodal pain control, IS, pulm toilet - PT/OT - S/P CT placement, 06/14, 45cc output in last 24hours -  repeat CXR showed small left effusion, no PTX - repeat CXR in AM  Mesenteric hemorrhage with soft tissue stranding- abdominal exam with some TTP on left side but nothing to suggest acute surgical issue - serial abdominal exams, tolerating diet, having flatus, no abdominal pain - reg diet  Acute blood loss anemia - hold lovenox - Hg 8.1 down from 8.7 06/14  T3 endplate compression fracture with 74mm of retropulsion/Non-displaced T9 endplate fracture- NS consult. TLSO brace, PT/OT, pain control- follow up 2 weeks  RPR:XYVOPFY diet, salt tabs for hyponatremia  VTE: SCDs, holding lovenox with ABL anemia ID: no abx indicatedcurrently Follow up: Dr. Sherley Bounds - 2 weeks  Plan: Continue IS, CT to water seal Consult cardiology re: new onset atrial fibrillation - will hold off on restarting home BP meds until Cardiology has evaluated Transfuse 2 u PRBC Will need home health PT/OT, 24 hour supervision, encourage ambulation, SCD's at all times.     LOS: 4 days    Maia Petties 04/20/2018

## 2018-04-20 NOTE — Progress Notes (Signed)
Pt's chest tube had an output of 20 mL overnight. Pt had bloody sputum at beginning of shift but it was a smaller amount than previous night.  Received call from tele during morning report that pt is in A-fib. Assessed pt and he does not feel any SOB. Pt asymptomatic. Pt states chest pain he currently feels is the same as he has experienced since injury and chest tube. Contacted Trauma MD and advised to admin Lopressor 5mg  every 5 mins x2.   Performed EKG after admin of meds with BP of 125/75. EKG shows A-fib w/ RVR, RT BBB. Copies of EKG in pt's chart.   Pt's HR still in the 110's after meds. Called Trauma and was advised that they will see pt on rounds. No new orders received

## 2018-04-20 NOTE — Progress Notes (Signed)
Subjective: Patient reports tolerating brace  Objective: Vital signs in last 24 hours: Temp:  [98.5 F (36.9 C)-99 F (37.2 C)] 99 F (37.2 C) (06/16 0735) Pulse Rate:  [71-130] 104 (06/16 0735) Resp:  [13-25] 18 (06/16 0735) BP: (121-154)/(53-82) 125/75 (06/16 0735) SpO2:  [94 %-100 %] 94 % (06/16 0735)  Intake/Output from previous day: 06/15 0701 - 06/16 0700 In: 480 [P.O.:480] Out: 1045 [Urine:1000; Chest Tube:45] Intake/Output this shift: No intake/output data recorded.  Physical Exam: Stable  Lab Results: Recent Labs    04/19/18 0436 04/20/18 0401  WBC 7.5 8.3  HGB 8.1* 7.8*  HCT 23.5* 22.0*  PLT 87* 90*   BMET Recent Labs    04/19/18 0436 04/20/18 0401  NA 130* 129*  K 4.5 4.2  CL 101 99*  CO2 22 24  GLUCOSE 115* 155*  BUN 21* 24*  CREATININE 1.34* 1.33*  CALCIUM 8.6* 8.4*    Studies/Results: Dg Chest Port 1 View  Result Date: 04/20/2018 CLINICAL DATA:  Hemothorax EXAM: PORTABLE CHEST 1 VIEW COMPARISON:  04/19/2018 FINDINGS: Left chest tube remains in place, unchanged. Heart is borderline in size. Diffuse interstitial prominence throughout the lungs slightly improved. Bibasilar atelectasis and small effusion. No pneumothorax. IMPRESSION: Left chest tube remains in place, unchanged. No visible pneumothorax. Diffuse interstitial prominence has improved, likely improving edema. Bibasilar atelectasis and small left effusion. Electronically Signed   By: Rolm Baptise M.D.   On: 04/20/2018 07:35   Dg Chest Port 1 View  Result Date: 04/19/2018 CLINICAL DATA:  Hemothorax on left.  Chest tube placement EXAM: PORTABLE CHEST 1 VIEW COMPARISON:  04/18/2018 FINDINGS: Pigtail catheter on the left is unchanged. Small left effusion. No pneumothorax Diffuse bilateral airspace disease is unchanged. Decreased lung volume compared with the prior study. Bibasilar atelectasis unchanged IMPRESSION: Left chest tube in place.  Small left effusion.  No pneumothorax Decreased lung  volume compared to the prior study. Bilateral airspace disease unchanged. Electronically Signed   By: Franchot Gallo M.D.   On: 04/19/2018 07:31   Dg Chest Port 1 View  Result Date: 04/18/2018 CLINICAL DATA:  Chest tube placement, checking for hemothorax. EXAM: PORTABLE CHEST 1 VIEW COMPARISON:  Portable film earlier in the day. FINDINGS: LEFT pigtail chest catheter has been placed and lies along the medial hemithorax, at the aortic arch level. LEFT effusion/hemothorax is reduced. There is no pneumothorax. Multiple LEFT-sided rib fractures. IMPRESSION: Decreased LEFT effusion/hemothorax. Electronically Signed   By: Staci Righter M.D.   On: 04/18/2018 13:37    Assessment/Plan: Spinal fractures stable.  Tolerating brace.  Continuing with chest tube, will require transfusion for acute blood loss anemia.  Dr. Ronnald Ramp to follow.    LOS: 4 days    Peggyann Shoals, MD 04/20/2018, 10:04 AM

## 2018-04-21 ENCOUNTER — Inpatient Hospital Stay (HOSPITAL_COMMUNITY): Payer: Medicare Other

## 2018-04-21 DIAGNOSIS — I340 Nonrheumatic mitral (valve) insufficiency: Secondary | ICD-10-CM

## 2018-04-21 LAB — TYPE AND SCREEN
ABO/RH(D): A POS
ANTIBODY SCREEN: NEGATIVE
UNIT DIVISION: 0
Unit division: 0

## 2018-04-21 LAB — COMPREHENSIVE METABOLIC PANEL
ALK PHOS: 38 U/L (ref 38–126)
ALT: 29 U/L (ref 17–63)
AST: 25 U/L (ref 15–41)
Albumin: 3 g/dL — ABNORMAL LOW (ref 3.5–5.0)
Anion gap: 7 (ref 5–15)
BUN: 25 mg/dL — AB (ref 6–20)
CALCIUM: 8.3 mg/dL — AB (ref 8.9–10.3)
CO2: 25 mmol/L (ref 22–32)
CREATININE: 1.3 mg/dL — AB (ref 0.61–1.24)
Chloride: 97 mmol/L — ABNORMAL LOW (ref 101–111)
GFR calc Af Amer: 60 mL/min — ABNORMAL LOW (ref >60)
GFR calc non Af Amer: 52 mL/min — ABNORMAL LOW (ref >60)
Glucose, Bld: 158 mg/dL — ABNORMAL HIGH (ref 65–99)
Potassium: 3.9 mmol/L (ref 3.5–5.1)
SODIUM: 129 mmol/L — AB (ref 135–145)
Total Bilirubin: 2.7 mg/dL — ABNORMAL HIGH (ref 0.3–1.2)
Total Protein: 5.4 g/dL — ABNORMAL LOW (ref 6.5–8.1)

## 2018-04-21 LAB — BPAM RBC
BLOOD PRODUCT EXPIRATION DATE: 201907062359
Blood Product Expiration Date: 201907062359
ISSUE DATE / TIME: 201906161009
ISSUE DATE / TIME: 201906161421
UNIT TYPE AND RH: 6200
Unit Type and Rh: 6200

## 2018-04-21 LAB — CBC
HCT: 28.2 % — ABNORMAL LOW (ref 39.0–52.0)
HEMOGLOBIN: 10 g/dL — AB (ref 13.0–17.0)
MCH: 32.3 pg (ref 26.0–34.0)
MCHC: 35.5 g/dL (ref 30.0–36.0)
MCV: 91 fL (ref 78.0–100.0)
Platelets: 102 10*3/uL — ABNORMAL LOW (ref 150–400)
RBC: 3.1 MIL/uL — AB (ref 4.22–5.81)
RDW: 13.5 % (ref 11.5–15.5)
WBC: 8 10*3/uL (ref 4.0–10.5)

## 2018-04-21 LAB — ECHOCARDIOGRAM COMPLETE
HEIGHTINCHES: 67 in
Weight: 2483.26 oz

## 2018-04-21 MED ORDER — SODIUM CHLORIDE 1 G PO TABS
1.0000 g | ORAL_TABLET | Freq: Two times a day (BID) | ORAL | Status: DC
  Administered 2018-04-21 – 2018-04-23 (×5): 1 g via ORAL
  Filled 2018-04-21 (×6): qty 1

## 2018-04-21 MED ORDER — ENOXAPARIN SODIUM 40 MG/0.4ML ~~LOC~~ SOLN
40.0000 mg | SUBCUTANEOUS | Status: DC
  Administered 2018-04-21 – 2018-04-25 (×5): 40 mg via SUBCUTANEOUS
  Filled 2018-04-21 (×6): qty 0.4

## 2018-04-21 MED ORDER — POLYETHYLENE GLYCOL 3350 17 G PO PACK
17.0000 g | PACK | Freq: Two times a day (BID) | ORAL | Status: DC
  Administered 2018-04-22 – 2018-04-23 (×2): 17 g via ORAL
  Filled 2018-04-21 (×6): qty 1

## 2018-04-21 NOTE — Progress Notes (Signed)
[facility]/Trauma Progress Note      Assessment/Plan HTN- hold meds in setting of hypotension, prn lopressor  HLD- hold home meds for now, will restart when tolerating PO diet BPH - home meds Mild renal insuffiencey  Anemia Thrombocytopenia  Fall from ladder6/12/19 Left rib fractures(left 3-8th ribs)with pulmonary contusionand small hemothorax, pneumatoceles- multimodal pain control, IS, pulm toilet - PT/OT - S/P CT placement, 06/14, 45cc output in last 24hours - repeat CXR showed small left effusion, no PTX, on H20 seal - repeat CXR pending 06/17 Mesenteric hemorrhage with soft tissue stranding- serial abdominal exams benign, tolerating diet, having flatus, no abdominal pain - reg diet Acute blood loss anemia - 2 U 06/16, Hg 10.0 06/17 T3 endplate compression fracture with 54mm of retropulsion/Non-displaced T9 endplate fracture- NS consult. TLSO brace, PT/OT, pain control- follow up 2 weeks  New onset A.fib - cards following - converted back to NSR after metoprolol. awaiting cards recs for BP meds - EKG showed St depression, ECHO pending  MHD:[id number] diet, salt tabs for hyponatremia VTE: SCDs, lovenox ID: no abx indicatedcurrently Follow up: Dr. Sherley Bounds - 2 weeks  Plan: Continue IS,CTto water seal, xray pending, salt tabs, ECHO pending  Will need home health PT/OT, 24 hour supervision,encourage ambulation, SCD's at all times.     LOS: 5 days    Subjective: CC: back pain and L rib pain  Pain well controlled. No abdominal pain. No pain or swelling in calves. No nausea or vomiting.   Objective: Vital signs in last 24 hours: Temp:  [98.6 F (37 C)-99.8 F (37.7 C)] 98.9 F (37.2 C) (06/17 0300) Pulse Rate:  [58-91] 58 (06/17 0400) Resp:  [11-23] 11 (06/17 0400) BP: (125-163)/(52-76) 126/53 (06/17 0400) SpO2:  [92 %-100 %] 97 % (06/17 0400) Last BM Date: 04/16/18(PTA)  Intake/Output from previous day: 06/16 0701 - 06/17  0700 In: 1600 [P.O.:900; Blood:700] Out: 970 [Urine:900; Chest Tube:70] Intake/Output this shift: No intake/output data recorded.  PE: Gen:  Alert, NAD, pleasant, cooperative Card:  RRR, no M/G/R heard Pulm:  diminished breath sounds L base with crackles, rate and effort normal. On 4L O2. Pulling 900cc on IS. No air leak of CT. Output sanguinous  Abd: Soft, NT/ND, + BS Extremities: no TTP or swelling to b/l calves Skin: no rashes noted, warm and dry  Anti-infectives: Anti-infectives (From admission, onward)   None      Lab Results:  Recent Labs    04/20/18 0401 04/21/18 0052  WBC 8.3 8.0  HGB 7.8* 10.0*  HCT 22.0* 28.2*  PLT 90* 102*   BMET Recent Labs    04/20/18 0401 04/21/18 0052  NA 129* 129*  K 4.2 3.9  CL 99* 97*  CO2 24 25  GLUCOSE 155* 158*  BUN 24* 25*  CREATININE 1.33* 1.30*  CALCIUM 8.4* 8.3*   PT/INR No results for input(s): LABPROT, INR in the last 72 hours. CMP     Component Value Date/Time   NA 129 (L) 04/21/2018 0052   K 3.9 04/21/2018 0052   CL 97 (L) 04/21/2018 0052   CO2 25 04/21/2018 0052   GLUCOSE 158 (H) 04/21/2018 0052   BUN 25 (H) 04/21/2018 0052   CREATININE 1.30 (H) 04/21/2018 0052   CALCIUM 8.3 (L) 04/21/2018 0052   PROT 5.4 (L) 04/21/2018 0052   ALBUMIN 3.0 (L) 04/21/2018 0052   AST 25 04/21/2018 0052   ALT 29 04/21/2018 0052   ALKPHOS 38 04/21/2018 0052   BILITOT 2.7 (H) 04/21/2018 8921  GFRNONAA 52 (L) 04/21/2018 0052   GFRAA 60 (L) 04/21/2018 0052   Lipase  No results found for: LIPASE  Studies/Results: Dg Chest Port 1 View  Result Date: 04/20/2018 CLINICAL DATA:  Hemothorax EXAM: PORTABLE CHEST 1 VIEW COMPARISON:  04/19/2018 FINDINGS: Left chest tube remains in place, unchanged. Heart is borderline in size. Diffuse interstitial prominence throughout the lungs slightly improved. Bibasilar atelectasis and small effusion. No pneumothorax. IMPRESSION: Left chest tube remains in place, unchanged. No visible  pneumothorax. Diffuse interstitial prominence has improved, likely improving edema. Bibasilar atelectasis and small left effusion. Electronically Signed   By: Rolm Baptise M.D.   On: 04/20/2018 07:35      Kalman Drape , [facility] 04/21/2018, 8:37 AM  Pager: [phone] Mon-Wed, Friday 7:00am-4:30pm Thurs 7am-11:30am  Consults: [phone]

## 2018-04-21 NOTE — Progress Notes (Signed)
    Maintaining sinus rhythm.  Echo reviewed.  Relatively normal.  We will continue to follow from a distance to ensure no recurrence of A. fib.  Otherwise would continue with current plan from Dr. Jackalyn Lombard consult.  A. fib is most likely resultant of acute trauma.  Plan for now is to hold off on any anticoagulation until we are able to determine if there is any recurrence. He is to follow-up in the A. fib clinic for outpatient monitor and potential stress test evaluation.  If he has recurrent episodes, we may need to consider standing dose of beta-blocker, however for now would not.   Pls call with ?s.  Glenetta Hew, MD

## 2018-04-21 NOTE — Progress Notes (Signed)
Physical Therapy Treatment Patient Details Name: Xavier Davis MRN: [id number] DOB: 1941-03-27 Today's Date: 04/21/2018    History of Present Illness 77 y.o. male admitted on 04/16/18 after falling from a ladder while trmming a tree.  He sustained a L rib fractures with pulmonary contusion, hemothorax, pneumatoceles, mesenteric hemorrhage (non surgical), T3 endplate compression fx with 50mm of retropulsion/non-displacedT9 endplate fx (treated with bracing).  Pt with significant PMH of HTN.  Pt s/p L CT insertion on 04/18/18.      PT Comments    PT with improved ambulation tolerance and is beginning to tolerate ambulation without RW. Pt with decreased pain as well. Pt progressing towards all goals. Acute PT to continue to follow.   Follow Up Recommendations  Home health PT;Supervision for mobility/OOB     Equipment Recommendations  Rolling walker with 5" wheels;3in1 (PT);Other (comment)    Recommendations for Other Services       Precautions / Restrictions Precautions Precautions: Back Precaution Booklet Issued: Yes (comment) Precaution Comments: reviewed back brace in detail with wife. reviewed precautions patient could recall 1 out 3 ( bending_ Required Braces or Orthoses: Spinal Brace;Cervical Brace Spinal Brace: Thoracolumbosacral orthotic;Other (comment) Spinal Brace Comments: when OOB. Restrictions Weight Bearing Restrictions: No    Mobility  Bed Mobility               General bed mobility comments: OOB in chair  Transfers Overall transfer level: Needs assistance Equipment used: Rolling walker (2 wheeled) Transfers: Sit to/from Stand Sit to Stand: Min guard         General transfer comment: verbal cues to push up from chair, slight increase in time  Ambulation/Gait Ambulation/Gait assistance: Min guard Gait Distance (Feet): 500 Feet Assistive device: Rolling walker (2 wheeled) Gait Pattern/deviations: Step-through pattern Gait velocity: slow Gait  velocity interpretation: >2.62 ft/sec, indicative of community ambulatory General Gait Details: amb 500' with rolling walker, verbal cues to decreased UE dependence on RW. Pt then amb 120 without AD and min guard. pt with no episode of LOB. TLSO did slide down to below hips requiring repositioning   Stairs             Wheelchair Mobility    Modified Rankin (Stroke Patients Only)       Balance Overall balance assessment: Mild deficits observed, not formally tested                                          Cognition Arousal/Alertness: Awake/alert Behavior During Therapy: WFL for tasks assessed/performed Overall Cognitive Status: Within Functional Limits for tasks assessed                                        Exercises      General Comments General comments (skin integrity, edema, etc.): went over all the precautions with wife as well as patient      Pertinent Vitals/Pain Pain Assessment: 0-10 Pain Score: 3  Pain Location: back Pain Descriptors / Indicators: Sore Pain Intervention(s): Monitored during session    Home Living                      Prior Function            PT Goals (current goals can now be found in the care  plan section) Progress towards PT goals: Progressing toward goals    Frequency    Min 5X/week      PT Plan Current plan remains appropriate    Co-evaluation              AM-PAC PT "6 Clicks" Daily Activity  Outcome Measure  Difficulty turning over in bed (including adjusting bedclothes, sheets and blankets)?: A Little Difficulty moving from lying on back to sitting on the side of the bed? : A Little Difficulty sitting down on and standing up from a chair with arms (e.g., wheelchair, bedside commode, etc,.)?: A Little Help needed moving to and from a bed to chair (including a wheelchair)?: A Little Help needed walking in hospital room?: A Little Help needed climbing 3-5 steps with  a railing? : A Little 6 Click Score: 18    End of Session Equipment Utilized During Treatment: Oxygen;Cervical collar;Back brace Activity Tolerance: Patient limited by pain;Patient tolerated treatment well Patient left: in chair;with call bell/phone within reach;with family/visitor present Nurse Communication: Mobility status PT Visit Diagnosis: Other abnormalities of gait and mobility (R26.89);Difficulty in walking, not elsewhere classified (R26.2);Pain Pain - Right/Left: Left     Time: 7829-5621 PT Time Calculation (min) (ACUTE ONLY): 28 min  Charges:  $Gait Training: 23-37 mins                    G Codes:       Kittie Plater, PT, DPT Pager #: [phone] Office #: [phone]    [place] 04/21/2018, 1:04 PM

## 2018-04-21 NOTE — Progress Notes (Signed)
  Echocardiogram 2D Echocardiogram has been performed.  Xavier Davis 04/21/2018, 12:30 PM

## 2018-04-21 NOTE — Progress Notes (Signed)
Patient chest tube back to suction

## 2018-04-21 NOTE — Progress Notes (Signed)
Echo is completed

## 2018-04-21 NOTE — Clinical Social Work Note (Signed)
Clinical Social Worker continuing to follow patient and family for support and discharge planning needs.  Per therapy, patient is appropriate for discharge home with home health services - RNCM to follow up.  Clinical Social Worker will sign off for now as social work intervention is no longer needed. Please consult [place] again if new need arises.  Barbette Or, [place]

## 2018-04-21 NOTE — Progress Notes (Signed)
Return call from Dr. Grandville Silos about patient complaint Relay to next Nurse about robaxin prn and repeat chest xray in the a.m.

## 2018-04-21 NOTE — Progress Notes (Addendum)
Pt c/o pain left flank near bruising.  Nurse assessed sight and redressed chest tube dressing Noticed bruising in area of pain Nurse paged doctor to alert Patient sating at 93% on 2 liters (nasal cannula) Ice applied

## 2018-04-22 ENCOUNTER — Inpatient Hospital Stay (HOSPITAL_COMMUNITY): Payer: Medicare Other

## 2018-04-22 LAB — CBC
HEMATOCRIT: 29.8 % — AB (ref 39.0–52.0)
HEMOGLOBIN: 10.2 g/dL — AB (ref 13.0–17.0)
MCH: 32.2 pg (ref 26.0–34.0)
MCHC: 34.2 g/dL (ref 30.0–36.0)
MCV: 94 fL (ref 78.0–100.0)
Platelets: 116 10*3/uL — ABNORMAL LOW (ref 150–400)
RBC: 3.17 MIL/uL — ABNORMAL LOW (ref 4.22–5.81)
RDW: 13.4 % (ref 11.5–15.5)
WBC: 7.2 10*3/uL (ref 4.0–10.5)

## 2018-04-22 LAB — COMPREHENSIVE METABOLIC PANEL
ALBUMIN: 2.7 g/dL — AB (ref 3.5–5.0)
ALT: 31 U/L (ref 17–63)
AST: 24 U/L (ref 15–41)
Alkaline Phosphatase: 46 U/L (ref 38–126)
Anion gap: 9 (ref 5–15)
BUN: 23 mg/dL — ABNORMAL HIGH (ref 6–20)
CO2: 26 mmol/L (ref 22–32)
Calcium: 8.7 mg/dL — ABNORMAL LOW (ref 8.9–10.3)
Chloride: 100 mmol/L — ABNORMAL LOW (ref 101–111)
Creatinine, Ser: 1.21 mg/dL (ref 0.61–1.24)
GFR calc Af Amer: >60 mL/min (ref >60)
GFR calc non Af Amer: 56 mL/min — ABNORMAL LOW (ref >60)
Glucose, Bld: 115 mg/dL — ABNORMAL HIGH (ref 65–99)
POTASSIUM: 4 mmol/L (ref 3.5–5.1)
Sodium: 135 mmol/L (ref 135–145)
Total Bilirubin: 1.8 mg/dL — ABNORMAL HIGH (ref 0.3–1.2)
Total Protein: 5.4 g/dL — ABNORMAL LOW (ref 6.5–8.1)

## 2018-04-22 NOTE — Progress Notes (Signed)
[facility]/Trauma Progress Note      Assessment/Plan HTN- hold meds in setting of hypotension, prn lopressor  HLD- hold home meds for now, will restart when tolerating PO diet BPH - home meds Mild renal insuffiencey  Anemia Thrombocytopenia  Fall from ladder6/12/19 Left rib fractures(left 3-8th ribs)with pulmonary contusionand small hemothorax, pneumatoceles- multimodal pain control, IS, pulm toilet - PT/OT - S/P CT placement, 06/14,45cc output in last 24hours - repeat CXR showed small left effusion, no PTX, on H20 seal - repeat CXR 06/17 showed worsening effusion, CT placed back to suction, 450cc output - CXR pending 06/18 Mesenteric hemorrhage with soft tissue stranding- serial abdominal exams benign, tolerating diet, having flatus, no abdominal pain Acute blood loss anemia - 2 U 06/16, Hg 10.2 06/17, stable T3 endplate compression fracture with 46mm of retropulsion/Non-displaced T9 endplate fracture- NS consult. TLSO brace, PT/OT, pain control- follow up 2 weeks  New onset A.fib - cards following - converted back to NSR after metoprolol - EKG showed St depression, ECHO relatively normal per cards, following from a distance   WVP:XTGGYIR diet, salt tabs for hyponatremia VTE: SCDs, lovenox ID: no abx indicatedcurrently Follow up: Dr. Sherley Bounds - 2 weeks  Plan: Continue IS,CTtosuction, xray pending, salt tabs  Will need home health PT/OT, 24 hour supervision,encourage ambulation, SCD's at all times.    LOS: 6 days    Subjective: CC: rib pain  CT placed back to suction yesterday and got out 450cc. No issues overnight. Pt had a BM. No abdominal pain, nausea or vomiting. Pulling around 900 on IS. No new complaints. No pain or swelling in calves.   Objective: Vital signs in last 24 hours: Temp:  [97.2 F (36.2 C)-99 F (37.2 C)] 98.7 F (37.1 C) (06/18 0300) Pulse Rate:  [61-79] 70 (06/18 0745) Resp:  [10-23] 15 (06/18  0745) BP: (131-191)/(55-74) 157/63 (06/18 0745) SpO2:  [93 %-98 %] 94 % (06/18 0745) Last BM Date: 04/21/18  Intake/Output from previous day: 06/17 0701 - 06/18 0700 In: 480 [P.O.:480] Out: 1451 [Urine:1000; Stool:1; Chest Tube:450] Intake/Output this shift: Total I/O In: -  Out: 100 [Chest Tube:100]  PE: Gen: Alert, NAD, pleasant, cooperative Card: RRR, no M/G/R heard Pulm:diminished breath sounds L base with crackles, rate andeffort normal. On 4L O2. No air leak of CT. Output sanguinous Abd: Soft, NT/ND Extremities: no TTP or swelling to b/l calves Skin: no rashes noted, warm and dry  Anti-infectives: Anti-infectives (From admission, onward)   None      Lab Results:  Recent Labs    04/21/18 0052 04/22/18 0406  WBC 8.0 7.2  HGB 10.0* 10.2*  HCT 28.2* 29.8*  PLT 102* 116*   BMET Recent Labs    04/21/18 0052 04/22/18 0406  NA 129* 135  K 3.9 4.0  CL 97* 100*  CO2 25 26  GLUCOSE 158* 115*  BUN 25* 23*  CREATININE 1.30* 1.21  CALCIUM 8.3* 8.7*   PT/INR No results for input(s): LABPROT, INR in the last 72 hours. CMP     Component Value Date/Time   NA 135 04/22/2018 0406   K 4.0 04/22/2018 0406   CL 100 (L) 04/22/2018 0406   CO2 26 04/22/2018 0406   GLUCOSE 115 (H) 04/22/2018 0406   BUN 23 (H) 04/22/2018 0406   CREATININE 1.21 04/22/2018 0406   CALCIUM 8.7 (L) 04/22/2018 0406   PROT 5.4 (L) 04/22/2018 0406   ALBUMIN 2.7 (L) 04/22/2018 0406   AST 24 04/22/2018 0406   ALT 31  04/22/2018 0406   ALKPHOS 46 04/22/2018 0406   BILITOT 1.8 (H) 04/22/2018 0406   GFRNONAA 56 (L) 04/22/2018 0406   GFRAA >60 04/22/2018 0406   Lipase  No results found for: LIPASE  Studies/Results: Dg Chest Port 1 View  Result Date: 04/21/2018 CLINICAL DATA:  Follow-up left hemithorax EXAM: PORTABLE CHEST 1 VIEW COMPARISON:  04/20/2018 FINDINGS: Left chest tube remains in place, unchanged. Moderate left pleural effusion, increased since prior study. No pneumothorax.  Bibasilar opacities, likely atelectasis. Mild cardiomegaly and vascular congestion. No acute bony abnormality. IMPRESSION: Left chest tube remains in place.  No pneumothorax. Increasing moderate left pleural effusion. Bibasilar opacities have increased, likely atelectasis. Cardiomegaly, vascular congestion. Electronically Signed   By: Rolm Baptise M.D.   On: 04/21/2018 09:18      Kalman Drape , [facility] Surgery 04/22/2018, 8:21 AM  Pager: [phone] Mon-Wed, Friday 7:00am-4:30pm Thurs 7am-11:30am  Consults: [phone]

## 2018-04-22 NOTE — Progress Notes (Signed)
Occupational Therapy Treatment Patient Details Name: Xavier Davis MRN: [id number] DOB: 22-Nov-1940 Today's Date: 04/22/2018    History of present illness 77 y.o. male admitted on 04/16/18 after falling from a ladder while trmming a tree.  He sustained a L rib fractures with pulmonary contusion, hemothorax, pneumatoceles, mesenteric hemorrhage (non surgical), T3 endplate compression fx with 30mm of retropulsion/non-displacedT9 endplate fx (treated with bracing).  Pt with significant PMH of HTN.  Pt s/p L CT insertion on 04/18/18.     OT comments  Pt progressing well towards OT goals and is motivated to regain independence. Pt completing hallway functional mobility using RW with overall minguard assist. Pt on 3L O2 during mobility with lowest SpO2 88%, quickly rebounds to >90% and overall remaining above 90% with activity. Further reviewed and educated pt/pt's spouse regarding back and cervical precautions, brace management, safety and compensatory strategies for completing ADLs and mobility while adhering to precautions. Feel POC remains appropriate at this time. Will continue to follow acutely to progress pt towards established OT goals.   Follow Up Recommendations  Home health OT;Supervision/Assistance - 24 hour    Equipment Recommendations  Tub/shower seat          Precautions / Restrictions Precautions Precautions: Back Precaution Booklet Issued: Yes (comment) Precaution Comments: pt able to recall 3/3 back precautions with increased time and cues  Required Braces or Orthoses: Spinal Brace;Cervical Brace Spinal Brace: Thoracolumbosacral orthotic;Other (comment) Spinal Brace Comments: when OOB. Restrictions Weight Bearing Restrictions: No       Mobility Bed Mobility               General bed mobility comments: OOB in chair  Transfers Overall transfer level: Needs assistance Equipment used: Rolling walker (2 wheeled) Transfers: Sit to/from Stand Sit to Stand: Min  guard         General transfer comment: verbal cues to push up from chair, slight increase in time    Balance Overall balance assessment: Needs assistance Sitting-balance support: Feet supported;No upper extremity supported Sitting balance-Leahy Scale: Good     Standing balance support: Bilateral upper extremity supported Standing balance-Leahy Scale: Fair                             ADL either performed or assessed with clinical judgement   ADL Overall ADL's : Needs assistance/impaired       Grooming Details (indicate cue type and reason): reviewed compensatory strategies for completing grooming ADLs including oral care                Lower Body Dressing Details (indicate cue type and reason): spouse assisting to adjust socks this session, pt also demonstrating figure 4 technique without difficulty for LB ADL              Functional mobility during ADLs: Min guard;Supervision/safety;Rolling walker General ADL Comments: reviewed brace management with pt and pt's spouse, will need additional education on changing pads for cervical brace; pt requesting to complete hallway ambulation, using RW to do so with overall minguard-supervision assist; Pt on 3L O2 during ambulation with SpO2 overall remaining above 90%, lowest SpO2 88% though quickly rebounds to >90%                       Cognition Arousal/Alertness: Awake/alert Behavior During Therapy: WFL for tasks assessed/performed Overall Cognitive Status: Within Functional Limits for tasks assessed  General Comments reviewed precautions, brace management and safety during ADLs with pt and spouse     Pertinent Vitals/ Pain       Pain Assessment: Faces Faces Pain Scale: Hurts a little bit Pain Location: back Pain Descriptors / Indicators: Sore Pain Intervention(s): Monitored during session  Home Living                                                         Frequency  Min 2X/week        Progress Toward Goals  OT Goals(current goals can now be found in the care plan section)  Progress towards OT goals: Progressing toward goals  Acute Rehab OT Goals Patient Stated Goal: to go home at discharge OT Goal Formulation: With patient/family Time For Goal Achievement: 05/01/18 Potential to Achieve Goals: Good  Plan Discharge plan remains appropriate    Co-evaluation                 AM-PAC PT "6 Clicks" Daily Activity     Outcome Measure   Help from another person eating meals?: A Little Help from another person taking care of personal grooming?: A Little Help from another person toileting, which includes using toliet, bedpan, or urinal?: A Lot Help from another person bathing (including washing, rinsing, drying)?: A Little Help from another person to put on and taking off regular upper body clothing?: A Lot Help from another person to put on and taking off regular lower body clothing?: A Lot 6 Click Score: 15    End of Session Equipment Utilized During Treatment: Cervical collar;Back brace;Rolling walker;Gait belt  OT Visit Diagnosis: Unsteadiness on feet (R26.81);Other abnormalities of gait and mobility (R26.89);History of falling (Z91.81);Pain Pain - part of body: (back )   Activity Tolerance Patient tolerated treatment well   Patient Left in chair;with call bell/phone within reach;with family/visitor present   Nurse Communication Mobility status        Time: 0355-9741 OT Time Calculation (min): 44 min  Charges: OT General Charges $OT Visit: 1 Visit OT Treatments $Self Care/Home Management : 8-22 mins $Therapeutic Activity: 8-22 mins  Xavier Davis, OT Pager [phone] 04/22/2018    Xavier Davis 04/22/2018, 4:06 PM

## 2018-04-23 ENCOUNTER — Inpatient Hospital Stay (HOSPITAL_COMMUNITY): Payer: Medicare Other

## 2018-04-23 NOTE — Progress Notes (Signed)
Physical Therapy Treatment Patient Details Name: Xavier Davis MRN: [id number] DOB: [birth year] Today's Date: 04/23/2018    History of Present Illness 77 y.o. male admitted on 04/16/18 after falling from a ladder while trmming a tree.  He sustained a L rib fractures with pulmonary contusion, hemothorax, pneumatoceles, mesenteric hemorrhage (non surgical), T3 endplate compression fx with 70mm of retropulsion/non-displacedT9 endplate fx (treated with bracing).  Pt with significant PMH of HTN.  Pt s/p L CT insertion on 04/18/18.      PT Comments    Improving steadily.  Discharge likely held up by CT output.  Emphasized education, gait stamina, and stairs.   Follow Up Recommendations  Home health PT;Supervision for mobility/OOB     Equipment Recommendations  Rolling walker with 5" wheels;3in1 (PT);Other (comment)(? shower seat with back)    Recommendations for Other Services       Precautions / Restrictions Precautions Precautions: Back Precaution Booklet Issued: Yes (comment) Precaution Comments: pt able to recall 3/3 back precautions with increased time and cues  Required Braces or Orthoses: Spinal Brace;Cervical Brace Spinal Brace: Thoracolumbosacral orthotic;Other (comment)    Mobility  Bed Mobility               General bed mobility comments: OOB in chair, discussed technique  Transfers Overall transfer level: Needs assistance Equipment used: Rolling walker (2 wheeled) Transfers: Sit to/from Stand Sit to Stand: Min guard         General transfer comment: cues for hand placement, no assist  Ambulation/Gait Ambulation/Gait assistance: Min guard Gait Distance (Feet): 400 Feet Assistive device: Rolling walker (2 wheeled) Gait Pattern/deviations: Step-through pattern Gait velocity: slower       Stairs Stairs: Yes Stairs assistance: Min guard Stair Management: One rail Right;Alternating pattern;Forwards Number of Stairs: 5 General stair comments: steady  with use of rail   Wheelchair Mobility    Modified Rankin (Stroke Patients Only)       Balance     Sitting balance-Leahy Scale: Good     Standing balance support: Bilateral upper extremity supported Standing balance-Leahy Scale: Fair                              Cognition Arousal/Alertness: Awake/alert Behavior During Therapy: WFL for tasks assessed/performed Overall Cognitive Status: Within Functional Limits for tasks assessed                                        Exercises      General Comments General comments (skin integrity, edema, etc.): Reinforced back education.  Sats on RA slowly dropped during gait to 88% over 10+ min and maintained at 92% on 2L.Marland Kitchen  Pt likely slower and guarded due to confining nature of lines and chest tube      Pertinent Vitals/Pain Faces Pain Scale: Hurts a little bit Pain Location: back Pain Descriptors / Indicators: Sore Pain Intervention(s): Monitored during session    Home Living                      Prior Function            PT Goals (current goals can now be found in the care plan section) Acute Rehab PT Goals Patient Stated Goal: to go home at discharge PT Goal Formulation: With patient Time For Goal Achievement: 05/01/18 Potential to Achieve Goals: Good  Progress towards PT goals: Progressing toward goals    Frequency    Min 5X/week      PT Plan Current plan remains appropriate    Co-evaluation              AM-PAC PT "6 Clicks" Daily Activity  Outcome Measure  Difficulty turning over in bed (including adjusting bedclothes, sheets and blankets)?: A Little Difficulty moving from lying on back to sitting on the side of the bed? : A Little Difficulty sitting down on and standing up from a chair with arms (e.g., wheelchair, bedside commode, etc,.)?: A Little Help needed moving to and from a bed to chair (including a wheelchair)?: A Little Help needed walking in hospital  room?: A Little Help needed climbing 3-5 steps with a railing? : A Little 6 Click Score: 18    End of Session Equipment Utilized During Treatment: Oxygen;Cervical collar;Back brace Activity Tolerance: Patient tolerated treatment well Patient left: in chair;with call bell/phone within reach Nurse Communication: Mobility status PT Visit Diagnosis: Other abnormalities of gait and mobility (R26.89);Pain Pain - part of body: (back)     Time: 1721-1746 PT Time Calculation (min) (ACUTE ONLY): 25 min  Charges:  $Gait Training: 8-22 mins $Therapeutic Activity: 8-22 mins                    G Codes:       2018-05-16  Donnella Sham, PT [phone]  (pager)   Tessie Fass Gracia Saggese 05-16-2018, 5:55 PM

## 2018-04-23 NOTE — Progress Notes (Addendum)
[facility]/Trauma Progress Note      Assessment/Plan HTN- hold meds in setting of hypotension, prn lopressor  HLD- hold home meds for now, will restart when tolerating PO diet BPH - home meds Mild renal insuffiencey  Anemia Thrombocytopenia  Fall from ladder6/12/19 Left rib fractures(left 3-8th ribs)with pulmonary contusionand small hemothorax, pneumatoceles- multimodal pain control, IS, pulm toilet - S/P CT placement, 06/14 - CXR 06/19 showed unchanged L hemothorax, output 520 in last 24hrs Mesenteric hemorrhage with soft tissue stranding- serial abdominal exams benign Acute blood loss anemia - 2 U 06/16, Hg 10.2 06/18, stable T3 endplate compression fracture with 17mm of retropulsion/Non-displaced T9 endplate fracture- NS following, TLSO brace, xrays pending  New onset A.fib- cards following peripherally  - converted back to NSR after metoprolol - EKG showed St depression, ECHO relatively normal per cards  QMV:HQIONGE diet, salt tabs for hyponatremia, AM labs VTE: SCDs,lovenox ID: no abx indicatedcurrently Follow up: Dr. Sherley Bounds - 2 weeks  Plan:Continue IS,CTtosuction, xray and CXR AM, salt tabs  Will need home health PT/OT, 24 hour supervision,encourage ambulation, SCD's    LOS: 7 days    Subjective: CC: rib pain  No issues overnight. Pt had a BM. No abdominal pain, nausea or vomiting. No fever or chills. No new complaints. No pain or swelling in calves.   Objective: Vital signs in last 24 hours: Temp:  [97.8 F (36.6 C)-98.9 F (37.2 C)] 98.8 F (37.1 C) (06/19 0724) Pulse Rate:  [68-69] 69 (06/19 0724) Resp:  [15-19] 17 (06/19 0724) BP: (150-155)/(67-78) 155/78 (06/19 0724) SpO2:  [94 %-96 %] 96 % (06/19 0724) Last BM Date: 04/22/18  Intake/Output from previous day: 06/18 0701 - 06/19 0700 In: 680 [P.O.:680] Out: 924 [Urine:402; Stool:2; Chest Tube:520] Intake/Output this shift: No intake/output data  recorded.  PE: Gen: Alert, NAD, pleasant, cooperative Card: RRR, no M/G/R heard Pulm:diminished breath sounds L base with mild crackles, rate andeffort normal. On 2L O2. No air leak of CT. Output sanguinous 520cc Abd: Soft, NT/ND Skin: no rashes noted, warm and dry  Anti-infectives: Anti-infectives (From admission, onward)   None      Lab Results:  Recent Labs    04/21/18 0052 04/22/18 0406  WBC 8.0 7.2  HGB 10.0* 10.2*  HCT 28.2* 29.8*  PLT 102* 116*   BMET Recent Labs    04/21/18 0052 04/22/18 0406  NA 129* 135  K 3.9 4.0  CL 97* 100*  CO2 25 26  GLUCOSE 158* 115*  BUN 25* 23*  CREATININE 1.30* 1.21  CALCIUM 8.3* 8.7*   PT/INR No results for input(s): LABPROT, INR in the last 72 hours. CMP     Component Value Date/Time   NA 135 04/22/2018 0406   K 4.0 04/22/2018 0406   CL 100 (L) 04/22/2018 0406   CO2 26 04/22/2018 0406   GLUCOSE 115 (H) 04/22/2018 0406   BUN 23 (H) 04/22/2018 0406   CREATININE 1.21 04/22/2018 0406   CALCIUM 8.7 (L) 04/22/2018 0406   PROT 5.4 (L) 04/22/2018 0406   ALBUMIN 2.7 (L) 04/22/2018 0406   AST 24 04/22/2018 0406   ALT 31 04/22/2018 0406   ALKPHOS 46 04/22/2018 0406   BILITOT 1.8 (H) 04/22/2018 0406   GFRNONAA 56 (L) 04/22/2018 0406   GFRAA >60 04/22/2018 0406   Lipase  No results found for: LIPASE  Studies/Results: Dg Chest Port 1 View  Result Date: 04/23/2018 CLINICAL DATA:  Hemothorax. EXAM: PORTABLE CHEST 1 VIEW COMPARISON:  Chest x-ray from yesterday.  FINDINGS: Unchanged position of the left pigtail chest tube. Stable small to moderate left pleural effusion. No pneumothorax. Unchanged left lower lobe opacity related to contusion. Unchanged right basilar atelectasis. Acute left posterior rib fractures again noted. Stable cardiomediastinal silhouette. IMPRESSION: 1. Unchanged small to moderate left hemothorax and left lower lobe contusion. Electronically Signed   By: Titus Dubin M.D.   On: 04/23/2018 09:05    Dg Chest Port 1 View  Result Date: 04/22/2018 CLINICAL DATA:  Hemothorax EXAM: PORTABLE CHEST 1 VIEW COMPARISON:  Yesterday FINDINGS: Left-sided chest tube which overlaps the mediastinum at the retention loop. Stable small to moderate left pleural effusion. No visible pneumothorax. Low lung volumes with streaky opacity on the right. Indistinct left lower lobe opacity from contusion by CT. Stable heart size with distortion by rotation. IMPRESSION: 1. Unchanged left hemothorax and lower lobe contusion. No visible pneumothorax. 2. Unchanged chest tube positioning overlapping the midline. Electronically Signed   By: Monte Fantasia M.D.   On: 04/22/2018 09:05      Kalman Drape , [facility] Surgery 04/23/2018, 9:24 AM  Pager: [phone] Mon-Wed, Friday 7:00am-4:30pm Thurs 7am-11:30am  Consults: [phone]

## 2018-04-23 NOTE — Progress Notes (Signed)
Subjective: Patient reports back is improving, walked twice yesterday, no NTW legs  Objective: Vital signs in last 24 hours: Temp:  [97.8 F (36.6 C)-98.9 F (37.2 C)] 98.8 F (37.1 C) (06/19 0724) Pulse Rate:  [68-69] 69 (06/19 0724) Resp:  [15-19] 17 (06/19 0724) BP: (150-155)/(67-78) 155/78 (06/19 0724) SpO2:  [94 %-96 %] 96 % (06/19 0724)  Intake/Output from previous day: 06/18 0701 - 06/19 0700 In: 680 [P.O.:680] Out: 924 [Urine:402; Stool:2; Chest Tube:520] Intake/Output this shift: No intake/output data recorded.  Neurologic: Grossly normal  Lab Results: Lab Results  Component Value Date   WBC 7.2 04/22/2018   HGB 10.2 (L) 04/22/2018   HCT 29.8 (L) 04/22/2018   MCV 94.0 04/22/2018   PLT 116 (L) 04/22/2018   Lab Results  Component Value Date   INR 1.06 04/16/2018   BMET Lab Results  Component Value Date   NA 135 04/22/2018   K 4.0 04/22/2018   CL 100 (L) 04/22/2018   CO2 26 04/22/2018   GLUCOSE 115 (H) 04/22/2018   BUN 23 (H) 04/22/2018   CREATININE 1.21 04/22/2018   CALCIUM 8.7 (L) 04/22/2018    Studies/Results: Dg Chest Port 1 View  Result Date: 04/22/2018 CLINICAL DATA:  Hemothorax EXAM: PORTABLE CHEST 1 VIEW COMPARISON:  Yesterday FINDINGS: Left-sided chest tube which overlaps the mediastinum at the retention loop. Stable small to moderate left pleural effusion. No visible pneumothorax. Low lung volumes with streaky opacity on the right. Indistinct left lower lobe opacity from contusion by CT. Stable heart size with distortion by rotation. IMPRESSION: 1. Unchanged left hemothorax and lower lobe contusion. No visible pneumothorax. 2. Unchanged chest tube positioning overlapping the midline. Electronically Signed   By: Monte Fantasia M.D.   On: 04/22/2018 09:05   Dg Chest Port 1 View  Result Date: 04/21/2018 CLINICAL DATA:  Follow-up left hemithorax EXAM: PORTABLE CHEST 1 VIEW COMPARISON:  04/20/2018 FINDINGS: Left chest tube remains in place,  unchanged. Moderate left pleural effusion, increased since prior study. No pneumothorax. Bibasilar opacities, likely atelectasis. Mild cardiomegaly and vascular congestion. No acute bony abnormality. IMPRESSION: Left chest tube remains in place.  No pneumothorax. Increasing moderate left pleural effusion. Bibasilar opacities have increased, likely atelectasis. Cardiomegaly, vascular congestion. Electronically Signed   By: Rolm Baptise M.D.   On: 04/21/2018 09:18    Assessment/Plan: Doing well from our standpoint, xrays today  Estimated body mass index is 24.31 kg/m as calculated from the following:   Height as of this encounter: 5\' 7"  (1.702 m).   Weight as of this encounter: 70.4 kg (155 lb 3.3 oz).    LOS: 7 days    Xavier Davis 04/23/2018, 8:01 AM

## 2018-04-23 NOTE — Progress Notes (Signed)
Nebulizer treatment is complete, reassessed and lungs are clearer, slight inspiratory wheezing right posterior. Patient off of O2 and saturation levels are holding above 93% on room air. Patient used his incentive spirometer 10 times while Nurse watched and met goal of 1250. O2 alarm set for 90%, patient aware he will go back on oxygen if saturation levels hit 90. Up in the chair.  Family bedside.

## 2018-04-23 NOTE — Progress Notes (Signed)
Family visited patient and called for a Nurse to assess patient. States "he sounds wet". Nurse assessed patient lung status. Patient respirations are 21 and O2 saturation is 03% on 2 liters.  Upon auscultation Nurse found that patient has inspiratory and expiratory wheezing upper right posterior and upper left anterior. Patient states that he has had "increased difficulty clearing my throat since walking with PT"  Nurse paged Physician to alert.

## 2018-04-23 NOTE — Progress Notes (Signed)
Nurse went to round on patient after his walk with Physical Therapy.  Pt denies pain Pt denies shortness of breath

## 2018-04-23 NOTE — Progress Notes (Signed)
Physician called back  Nurse to administer a nebulizing treatment Refer to [facility]

## 2018-04-23 NOTE — Care Management Important Message (Signed)
Important Message  Patient Details  Name: Xavier Davis MRN: [id number] Date of Birth: June 14, 1941   Medicare Important Message Given:  Yes    [name] [name] 04/23/2018, 3:37 PM

## 2018-04-24 ENCOUNTER — Inpatient Hospital Stay (HOSPITAL_COMMUNITY): Payer: Medicare Other

## 2018-04-24 LAB — BASIC METABOLIC PANEL
Anion gap: 8 (ref 5–15)
BUN: 23 mg/dL — AB (ref 6–20)
CO2: 29 mmol/L (ref 22–32)
CREATININE: 1.26 mg/dL — AB (ref 0.61–1.24)
Calcium: 9 mg/dL (ref 8.9–10.3)
Chloride: 104 mmol/L (ref 101–111)
GFR calc Af Amer: >60 mL/min (ref >60)
GFR calc non Af Amer: 54 mL/min — ABNORMAL LOW (ref >60)
GLUCOSE: 127 mg/dL — AB (ref 65–99)
Potassium: 4.5 mmol/L (ref 3.5–5.1)
SODIUM: 141 mmol/L (ref 135–145)

## 2018-04-24 LAB — CBC
HCT: 31.5 % — ABNORMAL LOW (ref 39.0–52.0)
Hemoglobin: 10.7 g/dL — ABNORMAL LOW (ref 13.0–17.0)
MCH: 31.8 pg (ref 26.0–34.0)
MCHC: 34 g/dL (ref 30.0–36.0)
MCV: 93.8 fL (ref 78.0–100.0)
PLATELETS: 178 10*3/uL (ref 150–400)
RBC: 3.36 MIL/uL — ABNORMAL LOW (ref 4.22–5.81)
RDW: 13.2 % (ref 11.5–15.5)
WBC: 8.2 10*3/uL (ref 4.0–10.5)

## 2018-04-24 NOTE — Progress Notes (Signed)
[facility]/Trauma Progress Note      Assessment/Plan HTN- hold meds in setting of hypotension, prn lopressor  HLD- hold home meds for now, will restart when tolerating PO diet BPH - home meds Mild renal insuffiencey  Anemia Thrombocytopenia  Fall from ladder6/12/19 Left rib fractures(left 3-8th ribs)with pulmonary contusionand small hemothorax, pneumatoceles- multimodal pain control, IS, pulm toilet - S/P CT placement, 06/14 - CXR 06/19 showed moderate residual hemothorax, no PTX, output 220 in last 24hrs Mesenteric hemorrhage with soft tissue stranding- serial abdominal exams benign Acute blood loss anemia - 2 U 06/16, Hg 10.706/18, stable T3 endplate compression fracture with 21mm of retropulsion/Non-displaced T9 endplate fracture- NS following, TLSO brace  New onset A.fib- cards following peripherally  - converted back to NSR after metoprolol - EKG showed St depression, ECHOrelatively normal per cards  ZTI:WPYKDXI diet, hyponatremia resolved VTE: SCDs,lovenox ID: no abx indicatedcurrently Follow up: Dr. Sherley Bounds - 2 weeks  Plan:Continue IS,continue CTtoH20 seal due to high output, CXR AM  Will need home health PT/OT, 24 hour supervision,encourage ambulation, SCD's     LOS: 8 days    Subjective: CC: rib pain  Wheezing after walking with PT yesterday. Neb helped. Coughing up less. Breathing is better. Using IS. No longer on O2. No abdominal pain, nausea or vomiting. Tolerating diet. No new complaints. No family at bedside.   Objective: Vital signs in last 24 hours: Temp:  [97.9 F (36.6 C)-98.8 F (37.1 C)] 98.8 F (37.1 C) (06/20 0809) Pulse Rate:  [59-73] 63 (06/20 0809) Resp:  [15-20] 19 (06/20 0809) BP: (145-165)/(59-73) 145/59 (06/20 0809) SpO2:  [90 %-98 %] 94 % (06/20 0809) Last BM Date: 04/23/18  Intake/Output from previous day: 06/19 0701 - 06/20 0700 In: 680 [P.O.:680] Out: 1197 [Urine:976; Stool:1;  Chest Tube:220] Intake/Output this shift: Total I/O In: -  Out: 125 [Urine:125]  PE: Gen: Alert, NAD, pleasant, cooperative Card: RRR, no M/G/R heard Pulm:mildly diminished breath sounds L base with mild crackles, rate andeffort normal. No air leak of CT. Output sanguinous 220cc Abd: Soft, NT/ND Skin: no rashes noted, warm and dry   Anti-infectives: Anti-infectives (From admission, onward)   None      Lab Results:  Recent Labs    04/22/18 0406 04/24/18 0421  WBC 7.2 8.2  HGB 10.2* 10.7*  HCT 29.8* 31.5*  PLT 116* 178   BMET Recent Labs    04/22/18 0406 04/24/18 0421  NA 135 141  K 4.0 4.5  CL 100* 104  CO2 26 29  GLUCOSE 115* 127*  BUN 23* 23*  CREATININE 1.21 1.26*  CALCIUM 8.7* 9.0   PT/INR No results for input(s): LABPROT, INR in the last 72 hours. CMP     Component Value Date/Time   NA 141 04/24/2018 0421   K 4.5 04/24/2018 0421   CL 104 04/24/2018 0421   CO2 29 04/24/2018 0421   GLUCOSE 127 (H) 04/24/2018 0421   BUN 23 (H) 04/24/2018 0421   CREATININE 1.26 (H) 04/24/2018 0421   CALCIUM 9.0 04/24/2018 0421   PROT 5.4 (L) 04/22/2018 0406   ALBUMIN 2.7 (L) 04/22/2018 0406   AST 24 04/22/2018 0406   ALT 31 04/22/2018 0406   ALKPHOS 46 04/22/2018 0406   BILITOT 1.8 (H) 04/22/2018 0406   GFRNONAA 54 (L) 04/24/2018 0421   GFRAA >60 04/24/2018 0421   Lipase  No results found for: LIPASE  Studies/Results: Dg Thoracic Spine 2 View  Result Date: 04/23/2018 CLINICAL DATA:  Fell 20 feet from  a ladder 1 week ago, thoracic spine fractures, rib fractures EXAM: THORACIC SPINE 2 VIEWS COMPARISON:  CT chest 04/16/2018 FINDINGS: Twelve pairs of ribs. Bones demineralized. Broad-based dextroconvex thoracic scoliosis versus positional finding. Multilevel endplate spur formation thoracic spine. Superior endplate compression fracture identified at T3, noted on prior CT. No additional thoracic spine fractures identified. LEFT thoracic pigtail drainage  catheter. Atherosclerotic calcifications aorta. IMPRESSION: Again identified mild superior endplate compression fracture of T3 vertebral body. 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 Electronically Signed   By: Lavonia Dana M.D.   On: 04/23/2018 10:52   Dg Chest Port 1 View  Result Date: 04/24/2018 CLINICAL DATA:  Hemothorax. EXAM: PORTABLE CHEST 1 VIEW COMPARISON:  04/24/2018. FINDINGS: Cardiomegaly persists. Pigtail catheter on the LEFT remains unchanged, medially positioned, with moderate residual hemothorax. No pneumothorax. Compressive atelectasis LEFT base. IMPRESSION: Stable chest. Electronically Signed   By: Staci Righter M.D.   On: 04/24/2018 08:31   Dg Chest Port 1 View  Result Date: 04/23/2018 CLINICAL DATA:  Hemothorax. EXAM: PORTABLE CHEST 1 VIEW COMPARISON:  Chest x-ray from yesterday. FINDINGS: Unchanged position of the left pigtail chest tube. Stable small to moderate left pleural effusion. No pneumothorax. Unchanged left lower lobe opacity related to contusion. Unchanged right basilar atelectasis. Acute left posterior rib fractures again noted. Stable cardiomediastinal silhouette. IMPRESSION: 1. Unchanged small to moderate left hemothorax and left lower lobe contusion. Electronically Signed   By: Titus Dubin M.D.   On: 04/23/2018 09:05   Dg Chest Port 1 View  Result Date: 04/22/2018 CLINICAL DATA:  Hemothorax EXAM: PORTABLE CHEST 1 VIEW COMPARISON:  Yesterday FINDINGS: Left-sided chest tube which overlaps the mediastinum at the retention loop. Stable small to moderate left pleural effusion. No visible pneumothorax. Low lung volumes with streaky opacity on the right. Indistinct left lower lobe opacity from contusion by CT. Stable heart size with distortion by rotation. IMPRESSION: 1. Unchanged left hemothorax and lower lobe contusion. No visible pneumothorax. 2. Unchanged chest tube positioning overlapping the midline. Electronically  Signed   By: Monte Fantasia M.D.   On: 04/22/2018 09:05      Kalman Drape , [facility] 04/24/2018, 8:50 AM  Pager: [phone] Mon-Wed, Friday 7:00am-4:30pm Thurs 7am-11:30am  Consults: [phone]

## 2018-04-24 NOTE — Progress Notes (Signed)
Occupational Therapy Treatment Patient Details Name: Xavier Davis MRN: [id number] DOB: 02-26-1941 Today's Date: 04/24/2018    History of present illness 77 y.o. male admitted on 04/16/18 after falling from a ladder while trmming a tree.  He sustained a L rib fractures with pulmonary contusion, hemothorax, pneumatoceles, mesenteric hemorrhage (non surgical), T3 endplate compression fx with 35mm of retropulsion/non-displacedT9 endplate fx (treated with bracing).  Pt with significant PMH of HTN.  Pt s/p L CT insertion on 04/18/18.     OT comments  Pt presents sitting in recliner, agreeable to tx session. Pt has been mobilizing well with both therapy and nursing staff. Additional focus this session on brace management and DME use with pt and spouse both verbalizing understanding. Pt able to verbalize 3/3 precautions this session and performing functional mobility using RW at [facility] assist level. Continue to recommend [place] services at discharge to maximize his safety and independence with ADL completion within actual home environment. Will continue to follow while pt remains in acute setting to promote continued mobility, safety and independence with ADLs.    Follow Up Recommendations  Home health OT;Supervision/Assistance - 24 hour    Equipment Recommendations  3 in 1 bedside commode          Precautions / Restrictions Precautions Precautions: Back Precaution Comments: pt able to recall 3/3 back precautions this session Required Braces or Orthoses: Spinal Brace;Cervical Brace Spinal Brace: Thoracolumbosacral orthotic;Other (comment) Spinal Brace Comments: when OOB. Restrictions Weight Bearing Restrictions: No       Mobility Bed Mobility               General bed mobility comments: OOB in chair  Transfers Overall transfer level: Needs assistance Equipment used: Rolling walker (2 wheeled) Transfers: Sit to/from Stand Sit to Stand: Min guard         General transfer  comment: cues for hand placement, no assist    Balance Overall balance assessment: Needs assistance Sitting-balance support: Feet supported;No upper extremity supported Sitting balance-Leahy Scale: Good     Standing balance support: Bilateral upper extremity supported;No upper extremity supported Standing balance-Leahy Scale: Fair Standing balance comment: able to maintain static standing without UE support                            ADL either performed or assessed with clinical judgement   ADL Overall ADL's : Needs assistance/impaired                 Upper Body Dressing : Moderate assistance;Sitting Upper Body Dressing Details (indicate cue type and reason): for brace management; reviewed with both spouse and pt                  Functional mobility during ADLs: Min guard;Supervision/safety;Rolling walker General ADL Comments: reviewed ADL completion while adhering to precautions; pt's spouse reports feeling comfortable with brace management after return home and pt is completing ADLs with spouse assist PRN; assisted with brace management this session. Pt on RA this session with SpO2 remaining above 93%; also educated on uses of 3:1 BSC as pt's spouse with questions                        Cognition Arousal/Alertness: Awake/alert Behavior During Therapy: WFL for tasks assessed/performed Overall Cognitive Status: Within Functional Limits for tasks assessed  Pertinent Vitals/ Pain       Pain Assessment: Faces Faces Pain Scale: No hurt Pain Intervention(s): Monitored during session  Home Living                                          Prior Functioning/Environment              Frequency  Min 2X/week        Progress Toward Goals  OT Goals(current goals can now be found in the care plan section)  Progress towards OT goals: Progressing  toward goals  Acute Rehab OT Goals Patient Stated Goal: to go home at discharge OT Goal Formulation: With patient/family Time For Goal Achievement: 05/01/18 Potential to Achieve Goals: Good  Plan Discharge plan remains appropriate    Co-evaluation                 AM-PAC PT "6 Clicks" Daily Activity     Outcome Measure   Help from another person eating meals?: A Little Help from another person taking care of personal grooming?: A Little Help from another person toileting, which includes using toliet, bedpan, or urinal?: A Little Help from another person bathing (including washing, rinsing, drying)?: A Little Help from another person to put on and taking off regular upper body clothing?: A Lot Help from another person to put on and taking off regular lower body clothing?: A Little 6 Click Score: 17    End of Session Equipment Utilized During Treatment: Cervical collar;Back brace;Rolling walker  OT Visit Diagnosis: Unsteadiness on feet (R26.81);Other abnormalities of gait and mobility (R26.89);History of falling (Z91.81)   Activity Tolerance Patient tolerated treatment well   Patient Left in chair;with call bell/phone within reach;with family/visitor present   Nurse Communication Mobility status        Time: 5320-2334 OT Time Calculation (min): 15 min  Charges: OT General Charges $OT Visit: 1 Visit OT Treatments $Self Care/Home Management : 8-22 mins   Lou Cal, OT Pager [phone] 04/24/2018    [name] 04/24/2018, 12:14 PM

## 2018-04-24 NOTE — Progress Notes (Signed)
T-spine xrays show stable T3 and T9 fxs

## 2018-04-24 NOTE — Progress Notes (Signed)
PT Cancellation Note  Patient Details Name: Xavier Davis MRN: [id number] DOB: 1941/04/28   Cancelled Treatment:    Reason Eval/Treat Not Completed: Patient declined, no reason specified.  Pt reports that he has been multiple times in the halls and wants to finish dinner and get out and about later. Will see 6/21 as able. 04/24/2018  Donnella Sham, PT [phone]  (pager)   Tessie Fass Jerre Vandrunen 04/24/2018, 6:32 PM

## 2018-04-25 ENCOUNTER — Inpatient Hospital Stay (HOSPITAL_COMMUNITY): Payer: Medicare Other

## 2018-04-25 LAB — BASIC METABOLIC PANEL
Anion gap: 7 (ref 5–15)
BUN: 28 mg/dL — ABNORMAL HIGH (ref 6–20)
CALCIUM: 8.6 mg/dL — AB (ref 8.9–10.3)
CO2: 27 mmol/L (ref 22–32)
CREATININE: 1.29 mg/dL — AB (ref 0.61–1.24)
Chloride: 103 mmol/L (ref 101–111)
GFR calc non Af Amer: 52 mL/min — ABNORMAL LOW (ref >60)
Glucose, Bld: 133 mg/dL — ABNORMAL HIGH (ref 65–99)
Potassium: 4 mmol/L (ref 3.5–5.1)
SODIUM: 137 mmol/L (ref 135–145)

## 2018-04-25 LAB — CBC
HCT: 30.9 % — ABNORMAL LOW (ref 39.0–52.0)
Hemoglobin: 10.5 g/dL — ABNORMAL LOW (ref 13.0–17.0)
MCH: 32.2 pg (ref 26.0–34.0)
MCHC: 34 g/dL (ref 30.0–36.0)
MCV: 94.8 fL (ref 78.0–100.0)
PLATELETS: 192 10*3/uL (ref 150–400)
RBC: 3.26 MIL/uL — AB (ref 4.22–5.81)
RDW: 13.1 % (ref 11.5–15.5)
WBC: 7.5 10*3/uL (ref 4.0–10.5)

## 2018-04-25 MED ORDER — METHOCARBAMOL 500 MG PO TABS: 500.0000 mg | ORAL_TABLET | Freq: Three times a day (TID) | ORAL | 0 refills | Status: DC | PRN

## 2018-04-25 MED ORDER — ACETAMINOPHEN 500 MG PO TABS: 1000.0000 mg | ORAL_TABLET | Freq: Three times a day (TID) | ORAL | 0 refills | Status: DC | PRN

## 2018-04-25 NOTE — Progress Notes (Signed)
[facility]/Trauma Progress Note      Assessment/Plan HTN- hold meds in setting of hypotension, prn lopressor  HLD- hold home meds for now, will restart when tolerating PO diet BPH - home meds Mild renal insuffiencey  Anemia Thrombocytopenia  Fall from ladder6/12/19 Left rib fractures(left 3-8th ribs)with pulmonary contusionand small hemothorax, pneumatoceles- multimodal pain control, IS, pulm toilet - S/P CT placement, 06/14 - CXR 06/21 showed sm pleural effusion, output 80 in last 24hrs Mesenteric hemorrhage with soft tissue stranding- serial abdominal exams benign Acute blood loss anemia - 2 U 06/16, Hg 10.706/18, stable T3 endplate compression fracture with 35mm of retropulsion/Non-displaced T9 endplate fracture- NS following,TLSO brace, repeat xrays are stable  New onset A.fib- cards following peripherally - converted back to NSR after metoprolol - EKG showed St depression, ECHOrelatively normal per cards  WPY:KDXIPJA diet, hyponatremia resolved VTE: SCDs,lovenox ID: no abx indicatedcurrently Follow up: Dr. Sherley Bounds - 2 weeks  Plan:Continue IS,pull CT, CXRthis afternoon and possible discharge  Will need home health PT/OT, 24 hour supervision,encourage ambulation, SCD's    LOS: 9 days    Subjective: CC: rib pain  Pt is feeling well. No new complaints. No issues overnight. No nausea, vomiting, abdominal pain, fever, chills, calf pain or swelling. No family at bedside.   Objective: Vital signs in last 24 hours: Temp:  [97.5 F (36.4 C)-98.8 F (37.1 C)] 98.8 F (37.1 C) (06/21 0803) Pulse Rate:  [62-73] 62 (06/21 0803) Resp:  [13-20] 15 (06/21 0803) BP: (140-164)/(64-73) 140/69 (06/21 0803) SpO2:  [93 %-96 %] 96 % (06/21 0803) Last BM Date: 04/24/18  Intake/Output from previous day: 06/20 0701 - 06/21 0700 In: 480 [P.O.:480] Out: 630 [Urine:550; Chest Tube:80] Intake/Output this shift: No intake/output data  recorded.  PE: Gen: Alert, NAD, pleasant, cooperative Card: RRR, no M/G/R heard Pulm:mildly diminished breath sounds L base with mildcrackles, rate andeffort normal. No air leak of CT. Output sanguinous80cc Abd: Soft, NT/ND Skin: no rashes noted, warm and dry    Anti-infectives: Anti-infectives (From admission, onward)   None      Lab Results:  Recent Labs    04/24/18 0421 04/25/18 0255  WBC 8.2 7.5  HGB 10.7* 10.5*  HCT 31.5* 30.9*  PLT 178 192   BMET Recent Labs    04/24/18 0421 04/25/18 0255  NA 141 137  K 4.5 4.0  CL 104 103  CO2 29 27  GLUCOSE 127* 133*  BUN 23* 28*  CREATININE 1.26* 1.29*  CALCIUM 9.0 8.6*   PT/INR No results for input(s): LABPROT, INR in the last 72 hours. CMP     Component Value Date/Time   NA 137 04/25/2018 0255   K 4.0 04/25/2018 0255   CL 103 04/25/2018 0255   CO2 27 04/25/2018 0255   GLUCOSE 133 (H) 04/25/2018 0255   BUN 28 (H) 04/25/2018 0255   CREATININE 1.29 (H) 04/25/2018 0255   CALCIUM 8.6 (L) 04/25/2018 0255   PROT 5.4 (L) 04/22/2018 0406   ALBUMIN 2.7 (L) 04/22/2018 0406   AST 24 04/22/2018 0406   ALT 31 04/22/2018 0406   ALKPHOS 46 04/22/2018 0406   BILITOT 1.8 (H) 04/22/2018 0406   GFRNONAA 52 (L) 04/25/2018 0255   GFRAA >60 04/25/2018 0255   Lipase  No results found for: LIPASE  Studies/Results: Dg Thoracic Spine 2 View  Result Date: 04/23/2018 CLINICAL DATA:  Fell 20 feet from a ladder 1 week ago, thoracic spine fractures, rib fractures EXAM: THORACIC SPINE 2 VIEWS COMPARISON:  CT  chest 04/16/2018 FINDINGS: Twelve pairs of ribs. Bones demineralized. Broad-based dextroconvex thoracic scoliosis versus positional finding. Multilevel endplate spur formation thoracic spine. Superior endplate compression fracture identified at T3, noted on prior CT. No additional thoracic spine fractures identified. LEFT thoracic pigtail drainage catheter. Atherosclerotic calcifications aorta. IMPRESSION: Again identified  mild superior endplate compression fracture of T3 vertebral body. 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 Electronically Signed   By: Lavonia Dana M.D.   On: 04/23/2018 10:52   Dg Chest Port 1 View  Result Date: 04/25/2018 CLINICAL DATA:  Hemothorax. EXAM: PORTABLE CHEST 1 VIEW COMPARISON:  04/24/2018. FINDINGS: Left chest tube in stable position. No pneumothorax. Heart size stable. Multifocal bilateral pulmonary infiltrates. Basilar atelectasis. Small left pleural effusion. Vascular stents noted in the left neck. Surgical clips left neck. IMPRESSION: 1.  Left chest tube in stable position.  No pneumothorax. 2. Multifocal bilateral pulmonary infiltrates. Basilar atelectasis. Small left pleural effusion. No interim change. Electronically Signed   By: Marcello Moores  Register   On: 04/25/2018 06:52   Dg Chest Port 1 View  Result Date: 04/24/2018 CLINICAL DATA:  Hemothorax. EXAM: PORTABLE CHEST 1 VIEW COMPARISON:  04/24/2018. FINDINGS: Cardiomegaly persists. Pigtail catheter on the LEFT remains unchanged, medially positioned, with moderate residual hemothorax. No pneumothorax. Compressive atelectasis LEFT base. IMPRESSION: Stable chest. Electronically Signed   By: Staci Righter M.D.   On: 04/24/2018 08:31   Dg Chest Port 1 View  Result Date: 04/23/2018 CLINICAL DATA:  Hemothorax. EXAM: PORTABLE CHEST 1 VIEW COMPARISON:  Chest x-ray from yesterday. FINDINGS: Unchanged position of the left pigtail chest tube. Stable small to moderate left pleural effusion. No pneumothorax. Unchanged left lower lobe opacity related to contusion. Unchanged right basilar atelectasis. Acute left posterior rib fractures again noted. Stable cardiomediastinal silhouette. IMPRESSION: 1. Unchanged small to moderate left hemothorax and left lower lobe contusion. Electronically Signed   By: Titus Dubin M.D.   On: 04/23/2018 09:05      Kalman Drape , [facility]  Surgery 04/25/2018, 8:20 AM  Pager: [phone] Mon-Wed, Friday 7:00am-4:30pm Thurs 7am-11:30am  Consults: [phone]

## 2018-04-25 NOTE — Discharge Instructions (Signed)
Can remove brace and c collar to shower. Please follow up with neurosurgery in 2 weeks.     PAIN CONTROL:  1. Pain is best controlled by a usual combination of three different methods TOGETHER:  1. Ice/Heat 2. Over the counter pain medication 3. Prescription pain medication 2. Most patients will experience some swelling and bruising around wounds. Ice packs or heating pads (30-60 minutes up to 6 times a day) will help. Use ice for the first few days to help decrease swelling and bruising, then switch to heat to help relax tight/sore spots and speed recovery. Some people prefer to use ice alone, heat alone, alternating between ice & heat. Experiment to what works for you. Swelling and bruising can take several weeks to resolve.  3. It is helpful to take an over-the-counter pain medication regularly for the first few weeks. Choose one of the following that works best for you:  1. Naproxen (Aleve, etc) Two 220mg  tabs twice a day 2. Ibuprofen (Advil, etc) Three 200mg  tabs four times a day (every meal & bedtime) 3. Acetaminophen (Tylenol, etc) 500-650mg  four times a day (every meal & bedtime) 4. A prescription for pain medication (such as oxycodone, hydrocodone, etc) should be given to you upon discharge. Take your pain medication as prescribed.  1. If you are having problems/concerns with the prescription medicine (does not control pain, nausea, vomiting, rash, itching, etc), please call us [phone] to see if we need to switch you to a different pain medicine that will work better for you and/or control your side effect better. 2. If you need a refill on your pain medication, please contact your pharmacy. They will contact our office to request authorization. Prescriptions will not be filled after 5 pm or on week-ends. 4. Avoid getting constipated. When taking pain medications, it is common to experience some constipation. Increasing fluid intake and taking a fiber supplement (such as Metamucil,  Citrucel, FiberCon, MiraLax, etc) 1-2 times a day regularly will usually help prevent this problem from occurring. A mild laxative (prune juice, Milk of Magnesia, MiraLax, etc) should be taken according to package directions if there are no bowel movements after 48 hours.  5. Watch out for diarrhea. If you have many loose bowel movements, simplify your diet to bland foods & liquids for a few days. Stop any stool softeners and decrease your fiber supplement. Switching to mild anti-diarrheal medications (Kayopectate, Pepto Bismol) can help. If this worsens or does not improve, please call us. 6. Remove your chest tube site dressing on 06/25. You may shower after day 3. No bathing or submerging your wounds in water until they heal. 7. FOLLOW UP in our office  1. July 2nd  WHEN TO CALL us [phone]:  1. Poor pain control 2. Reactions / problems with new medications (rash/itching, nausea, etc)  3. Fever over 101.5 F (38.5 C) 4. Worsening swelling or bruising 5. Continued bleeding from wounds. 6. Increased pain, redness, or drainage from the wounds which could be signs of infection  The clinic staff is available to answer your questions during regular business hours (8:30am-5pm). Please dont hesitate to call and ask to speak to one of our nurses for clinical concerns.  If you have a medical emergency, go to the nearest emergency room or call 911.  A surgeon from [facility] is always on call at the [facility], [place], [place], [place], [place] [address] ?  MAIN: [phone]  [phone] ? TOLL FREE: [phone] ?  FAX [phone]  [email or web address]

## 2018-04-25 NOTE — Care Management Note (Addendum)
Case Management Note  Patient Details  Name: Xavier Davis MRN: [id number] Date of Birth: 1941-08-28  Subjective/Objective:  77 y.o. male admitted on 04/16/18 after falling from a ladder while trmming a tree.  He sustained a L rib fractures with pulmonary contusion, hemothorax, pneumatoceles, mesenteric hemorrhage (non surgical), T3 endplate compression fx with 15mm of retropulsion/non-displacedT9 endplate fx.  PTA, pt independent, lives with spouse.                   Action/Plan:   Pt medically stable for dc home today with wife; family able to provide 24h care at dc.  PT/OT recommending HH follow up, and pt agreeable to f/u services.  Referral to [facility], per pt/wife choice, for [facility] and DME needs.  3 in 1 and RW to be delivered to room prior to dc.   Expected Discharge Date:    04/25/2018              Expected Discharge Plan:  [place]  In-House Referral:     Discharge planning Services  CM Consult  Post Acute Care Choice:  Durable Medical Equipment, Home Health Choice offered to:     DME Arranged:  3-N-1, Walker rolling DME Agency:  [facility] Arranged:  PT, OT [facility] Agency:  [place]  Status of Service:  Completed, signed off  If discussed at [place] of Stay Meetings, dates discussed:    Additional Comments:  Reinaldo Raddle, RN, BSN  Trauma/Neuro ICU Case Manager [phone]

## 2018-04-25 NOTE — Progress Notes (Signed)
Physical Therapy Treatment Patient Details Name: Xavier Davis MRN: [id number] DOB: 08-04-1941 Today's Date: 04/25/2018    History of Present Illness Pt is a 77 y.o. male admitted on 04/16/18 after falling from a ladder while trmming a tree.  He sustained a L rib fractures with pulmonary contusion, hemothorax, pneumatoceles, mesenteric hemorrhage (non surgical), T3 endplate compression fx with 59mm of retropulsion/non-displacedT9 endplate fx (treated with bracing).  Pt with significant PMH of HTN.  Pt s/p L CT insertion on 04/18/18.      PT Comments    Pt making steady progress with functional mobility. All VSS throughout. Pt would continue to benefit from skilled physical therapy services at this time while admitted and after d/c to address the below listed limitations in order to improve overall safety and independence with functional mobility.    Follow Up Recommendations  Home health PT;Supervision for mobility/OOB     Equipment Recommendations  Rolling walker with 5" wheels;3in1 (PT)    Recommendations for Other Services       Precautions / Restrictions Precautions Precautions: Back Required Braces or Orthoses: Spinal Brace;Cervical Brace Cervical Brace: Hard collar Spinal Brace: Thoracolumbosacral orthotic Restrictions Weight Bearing Restrictions: No    Mobility  Bed Mobility               General bed mobility comments: OOB in chair  Transfers Overall transfer level: Needs assistance Equipment used: Rolling walker (2 wheeled) Transfers: Sit to/from Stand Sit to Stand: Supervision         General transfer comment: supervision for safety, good technique  Ambulation/Gait Ambulation/Gait assistance: Supervision Gait Distance (Feet): 400 Feet Assistive device: Rolling walker (2 wheeled) Gait Pattern/deviations: Step-through pattern Gait velocity: WFL Gait velocity interpretation: 1.31 - 2.62 ft/sec, indicative of limited community ambulator General Gait  Details: pt steady with RW, no LOB or need for physical assistance, supervision for safety; VSS throughout   Stairs             Wheelchair Mobility    Modified Rankin (Stroke Patients Only)       Balance Overall balance assessment: Needs assistance Sitting-balance support: Feet supported;No upper extremity supported Sitting balance-Leahy Scale: Good     Standing balance support: Bilateral upper extremity supported;No upper extremity supported Standing balance-Leahy Scale: Fair                              Cognition Arousal/Alertness: Awake/alert Behavior During Therapy: WFL for tasks assessed/performed Overall Cognitive Status: Within Functional Limits for tasks assessed                                        Exercises      General Comments        Pertinent Vitals/Pain Pain Assessment: No/denies pain    Home Living                      Prior Function            PT Goals (current goals can now be found in the care plan section) Acute Rehab PT Goals PT Goal Formulation: With patient Time For Goal Achievement: 05/01/18 Potential to Achieve Goals: Good Progress towards PT goals: Progressing toward goals    Frequency    Min 5X/week      PT Plan Current plan remains appropriate    Co-evaluation  AM-PAC PT "6 Clicks" Daily Activity  Outcome Measure  Difficulty turning over in bed (including adjusting bedclothes, sheets and blankets)?: A Little Difficulty moving from lying on back to sitting on the side of the bed? : A Little Difficulty sitting down on and standing up from a chair with arms (e.g., wheelchair, bedside commode, etc,.)?: A Little Help needed moving to and from a bed to chair (including a wheelchair)?: None Help needed walking in hospital room?: None Help needed climbing 3-5 steps with a railing? : A Little 6 Click Score: 20    End of Session Equipment Utilized During  Treatment: Back brace;Cervical collar Activity Tolerance: Patient tolerated treatment well Patient left: with call bell/phone within reach;with family/visitor present;Other (comment)(sitting on toilet in bathroom, instructed to use call bell) Nurse Communication: Mobility status PT Visit Diagnosis: Other abnormalities of gait and mobility (R26.89)     Time: 7342-8768 PT Time Calculation (min) (ACUTE ONLY): 27 min  Charges:  $Gait Training: 8-22 mins $Therapeutic Activity: 8-22 mins                    G Codes:       [place], [place], [place] 04/25/2018, 11:13 AM

## 2018-04-25 NOTE — Progress Notes (Signed)
Patient discharged home with wife and son. Reviewed discharge instructions  With patient and wife.  Son took bedside commode and walker to car for patient.

## 2018-04-25 NOTE — Discharge Summary (Signed)
[facility]/Trauma Discharge Summary   Patient ID: Xavier Davis MRN: [id number] DOB/AGE: 1941-01-31 77 y.o.  Admit date: 04/16/2018 Discharge date: 04/25/2018  Admitting Diagnosis: Fall from ladder Left rib fractures with pulmonary contusion and small hemothorax Mesenteric hemorrhage  T3 endplate compression fracture Non-displaced T9 endplate fracture  Discharge Diagnosis Patient Active Problem List   Diagnosis Date Noted  . Fall from ladder 04/16/2018    Consultants Dr. Ronnald Ramp, Neurosurgery Dr. Rayann Heman, Cardiology  Imaging: Dg Thoracic Spine 2 View  Result Date: 04/23/2018 CLINICAL DATA:  [place] 20 feet from a ladder 1 week ago, thoracic spine fractures, rib fractures EXAM: THORACIC SPINE 2 VIEWS COMPARISON:  CT chest 04/16/2018 FINDINGS: Twelve pairs of ribs. Bones demineralized. Broad-based dextroconvex thoracic scoliosis versus positional finding. Multilevel endplate spur formation thoracic spine. Superior endplate compression fracture identified at T3, noted on prior CT. No additional thoracic spine fractures identified. LEFT thoracic pigtail drainage catheter. Atherosclerotic calcifications aorta. IMPRESSION: Again identified mild superior endplate compression fracture of T3 vertebral body. 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 Electronically Signed   By: Lavonia Dana M.D.   On: 04/23/2018 10:52   Dg Chest Port 1 View  Result Date: 04/25/2018 CLINICAL DATA:  Hemothorax. EXAM: PORTABLE CHEST 1 VIEW COMPARISON:  04/24/2018. FINDINGS: Left chest tube in stable position. No pneumothorax. Heart size stable. Multifocal bilateral pulmonary infiltrates. Basilar atelectasis. Small left pleural effusion. Vascular stents noted in the left neck. Surgical clips left neck. IMPRESSION: 1.  Left chest tube in stable position.  No pneumothorax. 2. Multifocal bilateral pulmonary infiltrates. Basilar atelectasis. Small left pleural  effusion. No interim change. Electronically Signed   By: Marcello Moores  Register   On: 04/25/2018 06:52   Dg Chest Port 1 View  Result Date: 04/24/2018 CLINICAL DATA:  Hemothorax. EXAM: PORTABLE CHEST 1 VIEW COMPARISON:  04/24/2018. FINDINGS: Cardiomegaly persists. Pigtail catheter on the LEFT remains unchanged, medially positioned, with moderate residual hemothorax. No pneumothorax. Compressive atelectasis LEFT base. IMPRESSION: Stable chest. Electronically Signed   By: Staci Righter M.D.   On: 04/24/2018 08:31    Procedures Brigid Re, PA (04/18/18) - Chest tube insertion  HPI: Patient is a 77 year old male who was brought in as a level 2 trauma after falling from a ladder earlier today. He was cutting a branch and it came back onto the ladder, fell about 20 ft and landed on top of ladder. Unsure whether he lost consciousness or not, but wife states he was sort of out of it and seemed to be gurgling. On my arrival patient complaining of pain in left chest and SOB. Denies dizziness, headache, blurred vision, diplopia, palpitations, nausea, numbness/tingling, focal weakness. Does report some mild abdominal pain and some mild back pain. Patient has PMH significant for HTN, HLD and BPH. No hx of COPD and quit smoking 20+ years ago but did have asthma as a child. Does not use inhalers. Takes 325 mg of ASA daily. NKDA.    Hospital Course:  Workup showed Left rib fractures with pulmonary contusion and small hemothorax, Mesenteric hemorrhage , T3 endplate compression fracture, Non-displaced T9 endplate fracture. Pt was admitted to the trauma service. Neurosurgery saw pt and recommended TLSO with cervical extension. Pt was placed in a TLSO and c collar. CT was placed on 06/14 due to persistent small L pleural effusion. Abdominal exams remained benign. Cardiology was consulted due to pt having a run of a. Fib. They recommended outpt follow up and no anticoagulation. They obtained  an ECHO which they stated was  relatively normal. Pt worked with therapies who recommended [facility] PT/OT. Chest tube out put decreased and CXR was stable on 06/21 therefore chest tube was removed. Follow up chest xray remained stable after chest tube removal.  On 6/21, the patient was voiding well, tolerating diet, ambulating well, pain well controlled, vital signs stable and felt stable for discharge home.  Patient will follow up as outlined below and knows to call with questions or concerns. Patient was discharged in good condition.  The [place] Substance controlled database was reviewed prior to prescribing narcotic pain medication to this patient.   Allergies as of 04/25/2018   No Known Allergies     Medication List    TAKE these medications   acetaminophen 500 MG tablet Commonly known as:  TYLENOL Take 2 tablets (1,000 mg total) by mouth every 8 (eight) hours as needed.   amLODipine 5 MG tablet Commonly known as:  NORVASC Take 5 mg by mouth daily.   aspirin 325 MG tablet Take 325 mg by mouth daily.   citalopram 40 MG tablet Commonly known as:  CELEXA Take 40 mg by mouth daily.   ferrous sulfate 325 (65 FE) MG tablet Take 325 mg by mouth daily with breakfast.   finasteride 5 MG tablet Commonly known as:  PROSCAR Take 5 mg by mouth daily.   hydrochlorothiazide 25 MG tablet Commonly known as:  HYDRODIURIL Take 25 mg by mouth daily.   lisinopril 30 MG tablet Commonly known as:  PRINIVIL,ZESTRIL Take 30 mg by mouth daily.   lovastatin 20 MG tablet Commonly known as:  MEVACOR Take 20 mg by mouth daily at 6 PM.   methocarbamol 500 MG tablet Commonly known as:  ROBAXIN Take 1 tablet (500 mg total) by mouth every 8 (eight) hours as needed for muscle spasms.   sildenafil 100 MG tablet Commonly known as:  VIAGRA Take 100 mg by mouth daily as needed for erectile dysfunction.   tamsulosin 0.4 MG Caps capsule Commonly known as:  FLOMAX Take 0.4 mg by mouth daily.   traZODone 50 MG  tablet Commonly known as:  DESYREL Take 50 mg by mouth at bedtime.   vitamin B-12 1000 MCG tablet Commonly known as:  CYANOCOBALAMIN Take 1,000 mcg by mouth daily.            Durable Medical Equipment  (From admission, onward)        Start     Ordered   04/18/18 1120  For home use only DME 3 n 1  Once     04/18/18 1120   04/18/18 1118  For home use only DME Walker rolling  Once    Comments:  To help patient transfer and ambulate.  Physical / Occupational Therapy may change type of walker PRN.  Question:  Patient needs a walker to treat with the following condition  Answer:  Closed T9 spinal fracture [facility])   04/18/18 1120       Follow-up Information    Eustace Moore, MD. Schedule an appointment as soon as possible for a visit in 2 week(s).   Specialty:  Neurosurgery Contact information: [address] [place] [address] [phone]        [facility] Follow up on 05/06/2018.   Why:  at 9:00AM. Please arrive 30 minutes prior to complete paperwork. Please bring photo ID and insurance card Contact information: [place] [address] [place] [phone]  [place]. Go on 05/05/2018.   Why:  you do not need to make an apppointment. If you have any issues please call [facility] [place] surgery at [phone] Contact information: [place], [place]       Thompson Grayer, MD. Schedule an appointment as soon as possible for a visit in 2 week(s).   Specialty:  Cardiology Why:  for follow up for new onset a. fib  Contact information: [facility] [place] [address] [phone]           Signed: Wellington Hampshire, [facility] Surgery 04/25/2018, 3:08 PM Pager: [phone] Consults: [phone] Mon 7:00 am -11:30 AM Tues-Fri 7:00 am-4:30 pm Sat-Sun 7:00 am-11:30 am

## 2018-04-26 ENCOUNTER — Telehealth: Payer: Self-pay | Admitting: General Surgery

## 2018-04-26 MED ORDER — GUAIFENESIN ER 600 MG PO TB12: 600.0000 mg | ORAL_TABLET | Freq: Two times a day (BID) | ORAL | 2 refills | Status: DC

## 2018-04-26 NOTE — Telephone Encounter (Signed)
Discussed with patient's wife.  He was discharged yesterday and was unable to sleep because he had horrible coughing and phlegm when he tried to lay down.  He also had issues with leg swelling.  I recommended taking mucinex DM 600 mg at night for cough.  I recommended increasing protein intake and propping legs up while in recliner or on sofa.

## 2018-04-28 ENCOUNTER — Telehealth (HOSPITAL_COMMUNITY): Payer: Self-pay

## 2018-04-28 ENCOUNTER — Encounter (INDEPENDENT_AMBULATORY_CARE_PROVIDER_SITE_OTHER): Payer: Self-pay | Admitting: Vascular Surgery

## 2018-04-28 NOTE — Telephone Encounter (Signed)
Xavier Davis from [place] called to get verbal PT orders for [facility].  PT twice a week for 3 weeks, then PT once a week for 1 week.  Please call her at [phone].  Thanks,  [place] [phone]

## 2018-04-28 NOTE — Telephone Encounter (Signed)
Returned call to Cherly Anderson with [facility] and gave verbal order for home PT 2x weekly for 3 weeks.  Brigid Re , [facility] 04/28/2018, 1:33 PM Pager: [phone] Mon-Fri 7:00 am-4:30 pm Sat-Sun 7:00 am-11:30 am

## 2018-05-05 ENCOUNTER — Ambulatory Visit
Admission: RE | Admit: 2018-05-05 | Discharge: 2018-05-05 | Disposition: A | Discharge status: Home / Self Care | Payer: Medicare Other | Source: Ambulatory Visit | Attending: General Surgery | Admitting: General Surgery

## 2018-05-05 ENCOUNTER — Other Ambulatory Visit: Payer: Self-pay | Admitting: General Surgery

## 2018-05-05 DIAGNOSIS — S2249XA Multiple fractures of ribs, unspecified side, initial encounter for closed fracture: Secondary | ICD-10-CM

## 2018-05-06 ENCOUNTER — Telehealth (HOSPITAL_COMMUNITY): Payer: Self-pay

## 2018-05-06 DIAGNOSIS — S22009A Unspecified fracture of unspecified thoracic vertebra, initial encounter for closed fracture: Secondary | ICD-10-CM

## 2018-05-06 DIAGNOSIS — J942 Hemothorax: Secondary | ICD-10-CM

## 2018-05-06 DIAGNOSIS — S2249XA Multiple fractures of ribs, unspecified side, initial encounter for closed fracture: Secondary | ICD-10-CM

## 2018-05-06 HISTORY — DX: Hemothorax: J94.2

## 2018-05-06 HISTORY — DX: Unspecified fracture of unspecified thoracic vertebra, initial encounter for closed fracture: S22.009A

## 2018-05-06 HISTORY — DX: Multiple fractures of ribs, unspecified side, initial encounter for closed fracture: S22.49XA

## 2018-05-06 NOTE — Telephone Encounter (Signed)
Esther from [place] called to let Dr. Hulen Skains know Mr. Lanuza wife has denied OT eval. Stating the pt. did not need OT. Esther's number is [phone] Thank you, Tressia Miners  [phone]

## 2018-05-07 ENCOUNTER — Encounter (HOSPITAL_COMMUNITY): Payer: Self-pay | Admitting: Nurse Practitioner

## 2018-05-07 ENCOUNTER — Ambulatory Visit (HOSPITAL_COMMUNITY)
Admission: RE | Admit: 2018-05-07 | Discharge: 2018-05-07 | Disposition: A | Discharge status: Home / Self Care | Payer: Medicare Other | Source: Ambulatory Visit | Attending: Nurse Practitioner | Admitting: Nurse Practitioner

## 2018-05-07 VITALS — BP 152/60 | HR 73 | Ht 67.0 in | Wt 157.0 lb

## 2018-05-07 DIAGNOSIS — N400 Enlarged prostate without lower urinary tract symptoms: Secondary | ICD-10-CM | POA: Diagnosis not present

## 2018-05-07 DIAGNOSIS — I4891 Unspecified atrial fibrillation: Secondary | ICD-10-CM | POA: Diagnosis present

## 2018-05-07 DIAGNOSIS — N189 Chronic kidney disease, unspecified: Secondary | ICD-10-CM | POA: Diagnosis not present

## 2018-05-07 DIAGNOSIS — Z7982 Long term (current) use of aspirin: Secondary | ICD-10-CM | POA: Insufficient documentation

## 2018-05-07 DIAGNOSIS — Z87891 Personal history of nicotine dependence: Secondary | ICD-10-CM | POA: Insufficient documentation

## 2018-05-07 DIAGNOSIS — E785 Hyperlipidemia, unspecified: Secondary | ICD-10-CM | POA: Diagnosis not present

## 2018-05-07 DIAGNOSIS — I4510 Unspecified right bundle-branch block: Secondary | ICD-10-CM | POA: Insufficient documentation

## 2018-05-07 DIAGNOSIS — Z79899 Other long term (current) drug therapy: Secondary | ICD-10-CM | POA: Diagnosis not present

## 2018-05-07 DIAGNOSIS — G4700 Insomnia, unspecified: Secondary | ICD-10-CM | POA: Insufficient documentation

## 2018-05-07 DIAGNOSIS — I779 Disorder of arteries and arterioles, unspecified: Secondary | ICD-10-CM | POA: Insufficient documentation

## 2018-05-07 DIAGNOSIS — Z823 Family history of stroke: Secondary | ICD-10-CM | POA: Insufficient documentation

## 2018-05-07 DIAGNOSIS — I129 Hypertensive chronic kidney disease with stage 1 through stage 4 chronic kidney disease, or unspecified chronic kidney disease: Secondary | ICD-10-CM | POA: Insufficient documentation

## 2018-05-07 DIAGNOSIS — F419 Anxiety disorder, unspecified: Secondary | ICD-10-CM | POA: Insufficient documentation

## 2018-05-07 NOTE — Progress Notes (Signed)
Primary Care Physician: Patient, No Pcp Per Referring Physician: Dr. Rayann Heman [facility] f/u   Xavier Davis is a 77 y.o. male with a h/o carotid disease, HTN, that  is in the afib clinic for f/u from afib noted during hospitalization for 18 ft fall for a ladder. Pt was found to have left rib fractures with pulmonary contusion and small hemothorax, mesenteric hemorrhage, T3 endplate compression fracture and non displaced T9 endplate fracture. Dr. Rayann Heman saw pt for episodes of  new onset afib, thought secondary to acute illness,  and due to trauma was not an candidate for anticoagulation, with CHA2DS2VASc score of 4.  He did convert to SR after metoprolol 5 mg IV x 2. He suggested a f/u event monitor on d/c to r/o recurrent arrhythmia. T wave abnormality noted on EKG and it was suggested to obtain a stress test when he had recovered from trauma event. Echo down and showed EF at 60-65%, mild LVH.   Today, he denies symptoms of palpitations, chest pain, shortness of breath, orthopnea, PND, lower extremity edema, dizziness, presyncope, syncope, or neurologic sequela. The patient is tolerating medications without difficulties and is otherwise without complaint today.   Past Medical History:  Diagnosis Date  . Anxiety   . BPH (benign prostatic hyperplasia)   . Carotid artery disease ([place])    s/p left carotid endarterectomy at [facility] in 2014  . Carotid stenosis    bilateral  . CKD (chronic kidney disease)   . Elevated PSA   . HLD (hyperlipidemia)   . HTN (hypertension)   . Hyperlipidemia   . Hypertension   . Insomnia   . Weak urinary stream    Past Surgical History:  Procedure Laterality Date  . APPENDECTOMY    . carotid stenosis Left    ces and stent placement    Current Outpatient Medications  Medication Sig Dispense Refill  . amLODipine (NORVASC) 5 MG tablet Take by mouth.    Marland Kitchen aspirin 325 MG tablet Take 325 mg by mouth daily. Takes 1/2 a tablet    . citalopram (CELEXA) 40 MG tablet Take by  mouth.    . ferrous sulfate 325 (65 FE) MG tablet Take 325 mg by mouth daily with breakfast.    . finasteride (PROSCAR) 5 MG tablet Take 1 tablet (5 mg total) by mouth daily. 90 tablet 4  . hydrochlorothiazide (HYDRODIURIL) 25 MG tablet Take 25 mg by mouth daily.    Marland Kitchen lisinopril (PRINIVIL,ZESTRIL) 30 MG tablet Take 30 mg by mouth daily.    Marland Kitchen lovastatin (MEVACOR) 20 MG tablet Take 20 mg by mouth at bedtime.    . methocarbamol (ROBAXIN) 500 MG tablet Take 1 tablet (500 mg total) by mouth every 8 (eight) hours as needed for muscle spasms. 25 tablet 0  . sildenafil (VIAGRA) 100 MG tablet Take by mouth.    . tamsulosin (FLOMAX) 0.4 MG CAPS capsule Take 0.4 mg by mouth daily.    . traZODone (DESYREL) 50 MG tablet Take 50 mg by mouth at bedtime.    . vitamin B-12 (CYANOCOBALAMIN) 1000 MCG tablet Take 1,000 mcg by mouth daily.    . hydrALAZINE (APRESOLINE) 25 MG tablet Take by mouth.     No current facility-administered medications for this encounter.     No Known Allergies  Social History   Socioeconomic History  . Marital status: Married    Spouse name: Not on file  . Number of children: Not on file  . Years of education: Not on  file  . Highest education level: Not on file  Occupational History  . Not on file  Social Needs  . Financial resource strain: Not on file  . Food insecurity:    Worry: Not on file    Inability: Not on file  . Transportation needs:    Medical: Not on file    Non-medical: Not on file  Tobacco Use  . Smoking status: Former Research scientist (life sciences)  . Smokeless tobacco: Never Used  . Tobacco comment: quit 40 years ago  Substance and Sexual Activity  . Alcohol use: Never    Frequency: Never    Comment: occasional  . Drug use: Never  . Sexual activity: Not on file  Lifestyle  . Physical activity:    Days per week: Not on file    Minutes per session: Not on file  . Stress: Not on file  Relationships  . Social connections:    Talks on phone: Not on file    Gets  together: Not on file    Attends religious service: Not on file    Active member of club or organization: Not on file    Attends meetings of clubs or organizations: Not on file    Relationship status: Not on file  . Intimate partner violence:    Fear of current or ex partner: Not on file    Emotionally abused: Not on file    Physically abused: Not on file    Forced sexual activity: Not on file  Other Topics Concern  . Not on file  Social History Narrative   ** Merged History Encounter **        Family History  Problem Relation Age of Onset  . Stroke Father   . CVA Father   . Diabetes Mellitus II Father   . Prostate cancer Neg Hx   . Kidney disease Neg Hx   . Kidney cancer Neg Hx   . Bladder Cancer Neg Hx     ROS- All systems are reviewed and negative except as per the HPI above  Physical Exam: Vitals:   05/07/18 1352  BP: (!) 152/60  Pulse: 73  Weight: 157 lb (71.2 kg)  Height: 5\' 7"  (1.702 m)   Wt Readings from Last 3 Encounters:  05/07/18 157 lb (71.2 kg)  04/16/18 155 lb 3.3 oz (70.4 kg)  10/25/17 150 lb (68 kg)    Labs: Lab Results  Component Value Date   NA 137 04/25/2018   K 4.0 04/25/2018   CL 103 04/25/2018   CO2 27 04/25/2018   GLUCOSE 133 (H) 04/25/2018   BUN 28 (H) 04/25/2018   CREATININE 1.29 (H) 04/25/2018   CALCIUM 8.6 (L) 04/25/2018   Lab Results  Component Value Date   INR 1.06 04/16/2018   No results found for: CHOL, HDL, LDLCALC, TRIG   GEN- The patient is well appearing, alert and oriented x 3 today.  Wearing neck and back brace. Head- normocephalic, atraumatic Eyes-  Sclera clear, conjunctiva pink Ears- hearing intact Oropharynx- clear Neck- supple, no JVP Lymph- no cervical lymphadenopathy Lungs- Clear to ausculation bilaterally, normal work of breathing Heart- Regular rate and rhythm, no murmurs, rubs or gallops, PMI not laterally displaced GI- soft, NT, ND, + BS Extremities- no clubbing, cyanosis, or edema MS- no  significant deformity or atrophy Skin- no rash or lesion Psych- euthymic mood, full affect Neuro- strength and sensation are intact  EKG- NSR with RBBB, HR at 73 bpm, pr int 120 ms, qrs int 130  ms, qtc 467 ms, T wave abnormality, consider inferolateral ischemia Epic records reviewed Echo -Study Conclusions  - Left ventricle: The cavity size was normal. Wall thickness was   increased in a pattern of mild LVH. Systolic function was normal.   The estimated ejection fraction was in the range of 60% to 65%.   Left ventricular diastolic function parameters were normal. - Mitral valve: There was mild regurgitation. - Left atrium: The atrium was moderately dilated.    Assessment and Plan: 1. New onset afib In the setting of acute trauma Now in SR Not placed on anticoagulation 2/2 trauma Will be scheduled for 30 day event monitor to r/o recurrent  Arrhythmia, and if present pt may then be eligible for anticoagulation  He lives in [place] and is wanting to have  f/u there Will get monitor placed in [facility] office and f/u with general cardiology  in 4-6 weeks to review monitor and discuss scheduling of  f/u stress test   2. HTN Mildly elevated but pt states that this is his baseline Continue current antihypertensives  3. Carotid disease, s/p endarterectomy  Followed by Dr. Synthia Innocent C. Galo Sayed, [facility] [address] [place], [place] [address] [phone]

## 2018-05-07 NOTE — Telephone Encounter (Signed)
Saverio Danker saw pt yesterday in trauma clinic and is aware that pt is denying OT.   Jackson Latino, [facility] Surgery Pager [phone]

## 2018-05-14 ENCOUNTER — Ambulatory Visit (INDEPENDENT_AMBULATORY_CARE_PROVIDER_SITE_OTHER): Payer: Medicare Other

## 2018-05-14 DIAGNOSIS — I4891 Unspecified atrial fibrillation: Secondary | ICD-10-CM

## 2018-06-23 ENCOUNTER — Other Ambulatory Visit: Payer: Self-pay | Admitting: Student

## 2018-06-23 DIAGNOSIS — S22030D Wedge compression fracture of third thoracic vertebra, subsequent encounter for fracture with routine healing: Secondary | ICD-10-CM

## 2018-06-24 ENCOUNTER — Other Ambulatory Visit: Payer: Self-pay | Admitting: *Deleted

## 2018-06-24 DIAGNOSIS — I4891 Unspecified atrial fibrillation: Secondary | ICD-10-CM

## 2018-06-24 NOTE — Progress Notes (Deleted)
7:55 AM   Xavier Davis 1941/06/15 [id number]  Referring provider: Tracie Harrier, [facility] [place], [place] [address]  No chief complaint on file.   HPI: Xavier Davis is a 77 year old Caucasian male with a history of elevated PSA, BPH with LUTS and a history of incomplete bladder emptying who presents today for his 12 months follow up.    BPH WITH LUTS: His IPSS score today is 10, which is moderate lower urinary tract symptomatology.  He is pleased with his quality life due to his urinary symptoms.  His previous IPSS score was 10/1.  His previous PVR is 0 mL.  His major complaint today is *** .   He denies any dysuria, hematuria or suprapubic pain.  He has found the tamsulosin cost prohibitive.  He has not taken it in two days.  He is taking  finasteride.  His has had a biopsy in 2014.  No malignancy was found.  He also denies any recent fevers, chills, nausea or vomiting.  He does not have a family history of PCa.    Score:  1-7 Mild 8-19 Moderate 20-35 Severe    PMH: Past Medical History:  Diagnosis Date  . Anxiety   . BPH (benign prostatic hyperplasia)   . Carotid artery disease ([place])    s/p left carotid endarterectomy at [facility] in 2014  . Carotid stenosis    bilateral  . CKD (chronic kidney disease)   . Elevated PSA   . HLD (hyperlipidemia)   . HTN (hypertension)   . Hyperlipidemia   . Hypertension   . Insomnia   . Weak urinary stream     Surgical History: Past Surgical History:  Procedure Laterality Date  . APPENDECTOMY    . carotid stenosis Left    ces and stent placement    Home Medications:  Allergies as of 06/25/2018   No Known Allergies     Medication List        Accurate as of 06/24/18  7:55 AM. Always use your most recent med list.          amLODipine 5 MG tablet Commonly known as:  NORVASC Take by mouth.   aspirin 325 MG tablet Take 325 mg by mouth daily. Takes 1/2 a tablet   citalopram 40 MG  tablet Commonly known as:  CELEXA Take by mouth.   ferrous sulfate 325 (65 FE) MG tablet Take 325 mg by mouth daily with breakfast.   finasteride 5 MG tablet Commonly known as:  PROSCAR Take 1 tablet (5 mg total) by mouth daily.   hydrALAZINE 25 MG tablet Commonly known as:  APRESOLINE Take by mouth.   hydrochlorothiazide 25 MG tablet Commonly known as:  HYDRODIURIL Take 25 mg by mouth daily.   lisinopril 30 MG tablet Commonly known as:  PRINIVIL,ZESTRIL Take 30 mg by mouth daily.   lovastatin 20 MG tablet Commonly known as:  MEVACOR Take 20 mg by mouth at bedtime.   methocarbamol 500 MG tablet Commonly known as:  ROBAXIN Take 1 tablet (500 mg total) by mouth every 8 (eight) hours as needed for muscle spasms.   sildenafil 100 MG tablet Commonly known as:  VIAGRA Take by mouth.   tamsulosin 0.4 MG Caps capsule Commonly known as:  FLOMAX Take 0.4 mg by mouth daily.   traZODone 50 MG tablet Commonly known as:  DESYREL Take 50 mg by mouth at bedtime.   vitamin B-12 1000 MCG tablet Commonly  known as:  CYANOCOBALAMIN Take 1,000 mcg by mouth daily.       Allergies: No Known Allergies  Family History: Family History  Problem Relation Age of Onset  . Stroke Father   . CVA Father   . Diabetes Mellitus II Father   . Prostate cancer Neg Hx   . Kidney disease Neg Hx   . Kidney cancer Neg Hx   . Bladder Cancer Neg Hx     Social History:  reports that he has quit smoking. He has never used smokeless tobacco. He reports that he does not drink alcohol or use drugs.  ROS:                                        Physical Exam: There were no vitals taken for this visit.  Constitutional: Well nourished. Alert and oriented, No acute distress. HEENT: [place] AT, moist mucus membranes. Trachea midline, no masses. Cardiovascular: No clubbing, cyanosis, or edema. Respiratory: Normal respiratory effort, no increased work of breathing. GI: Abdomen is  soft, non tender, non distended, no abdominal masses. Liver and spleen not palpable.  No hernias appreciated.  Stool sample for occult testing is not indicated.   GU: No CVA tenderness.  No bladder fullness or masses.  Patient with circumcised/uncircumcised phallus. ***Foreskin easily retracted***  Urethral meatus is patent.  No penile discharge. No penile lesions or rashes. Scrotum without lesions, cysts, rashes and/or edema.  Testicles are located scrotally bilaterally. No masses are appreciated in the testicles. Left and right epididymis are normal. Rectal: Patient with  normal sphincter tone. Anus and perineum without scarring or rashes. No rectal masses are appreciated. Prostate is approximately 55 grams, no nodules are appreciated. Seminal vesicles are normal. Skin: No rashes, bruises or suspicious lesions. Lymph: No cervical or inguinal adenopathy. Neurologic: Grossly intact, no focal deficits, moving all 4 extremities. Psychiatric: Normal mood and affect.    Laboratory Data:  PSA history:  4.1 ng/mL on 06/22/2014             4.2 ng/mL on 12/23/2014  1.7 ng/mL on 06/23/2015-stopped taking the finasteride  2.5 ng/mL on 12/19/2015  1.2 ng/mL on 06/20/2016 - patient decided not to continue with PSA screening    Assessment & Plan:    1. BPH with LUTS ***    No follow-ups on file.  Xavier Council, PA-C  [facility] [address] [place], [place] [address] [phone]

## 2018-06-25 ENCOUNTER — Other Ambulatory Visit: Payer: Self-pay | Admitting: Urology

## 2018-06-25 ENCOUNTER — Ambulatory Visit: Payer: Medicare Other | Admitting: Urology

## 2018-07-02 ENCOUNTER — Encounter: Payer: Self-pay | Admitting: Internal Medicine

## 2018-07-02 ENCOUNTER — Ambulatory Visit (INDEPENDENT_AMBULATORY_CARE_PROVIDER_SITE_OTHER): Payer: Medicare Other | Admitting: Internal Medicine

## 2018-07-02 VITALS — BP 150/60 | HR 72 | Ht 67.0 in | Wt 155.8 lb

## 2018-07-02 DIAGNOSIS — I10 Essential (primary) hypertension: Secondary | ICD-10-CM

## 2018-07-02 DIAGNOSIS — I480 Paroxysmal atrial fibrillation: Secondary | ICD-10-CM | POA: Diagnosis not present

## 2018-07-02 DIAGNOSIS — M7989 Other specified soft tissue disorders: Secondary | ICD-10-CM

## 2018-07-02 NOTE — Patient Instructions (Addendum)
Medication Instructions:  Your physician recommends that you continue on your current medications as directed. Please refer to the Current Medication list given to you today.   Labwork: NONE  Testing/Procedures: Your physician has requested that you have a lower extremity venous duplex THIS WEEK. This test is an ultrasound of the veins in the legs. It looks at venous blood flow that carries blood from the heart to the legs. Allow one hour for a Lower Venous exam. There are no restrictions or special instructions.   PLEASE CALL us TO SCHEDULE ONCE CLEARED - Your physician has requested that you have a lexiscan myoview. For further information please visit [email or web address]. Please follow instruction sheet, as given.  [place]  Your caregiver has ordered a Stress Test with nuclear imaging. The purpose of this test is to evaluate the blood supply to your heart muscle. This procedure is referred to as a "Non-Invasive Stress Test." This is because other than having an IV started in your vein, nothing is inserted or "invades" your body. Cardiac stress tests are done to find areas of poor blood flow to the heart by determining the extent of coronary artery disease (CAD). Some patients exercise on a treadmill, which naturally increases the blood flow to your heart, while others who are  unable to walk on a treadmill due to physical limitations have a pharmacologic/chemical stress agent called Lexiscan . This medicine will mimic walking on a treadmill by temporarily increasing your coronary blood flow.   Please note: these test may take anywhere between 2-4 hours to complete  PLEASE REPORT TO [facility] AT THE FIRST DESK WILL DIRECT YOU WHERE TO GO  Date of Procedure:_____________________________________  Arrival Time for Procedure:______________________________   PLEASE NOTIFY THE OFFICE AT LEAST 24 HOURS IN ADVANCE IF YOU ARE UNABLE TO KEEP YOUR APPOINTMENT.   [phone] AND  PLEASE NOTIFY NUCLEAR MEDICINE AT [facility] AT LEAST 24 HOURS IN ADVANCE IF YOU ARE UNABLE TO KEEP YOUR APPOINTMENT. [phone]  How to prepare for your Myoview test:  1. Do not eat or drink after midnight 2. No caffeine for 24 hours prior to test 3. No smoking 24 hours prior to test. 4. Your medication may be taken with water.  If your doctor stopped a medication because of this test, do not take that medication. 5. Skirts or pants are appropriate. Please wear a short sleeve shirt. 6. No perfume, cologne or lotion. 7. Wear comfortable walking shoes.       Follow-Up: Your physician recommends that you schedule a follow-up appointment in: 4-6 WEEKS WITH APP.  If you need a refill on your cardiac medications before your next appointment, please call your pharmacy.    Cardiac Nuclear Scan A cardiac nuclear scan is a test that measures blood flow to the heart when a person is resting and when he or she is exercising. The test looks for problems such as:  Not enough blood reaching a portion of the heart.  The heart muscle not working normally.  You may need this test if:  You have heart disease.  You have had abnormal lab results.  You have had heart surgery or angioplasty.  You have chest pain.  You have shortness of breath.  In this test, a radioactive dye (tracer) is injected into your bloodstream. After the tracer has traveled to your heart, an imaging device is used to measure how much of the tracer is absorbed by or distributed to various areas of  your heart. This procedure is usually done at a hospital and takes 2-4 hours. Tell a health care provider about:  Any allergies you have.  All medicines you are taking, including vitamins, herbs, eye drops, creams, and over-the-counter medicines.  Any problems you or family members have had with the use of anesthetic medicines.  Any blood disorders you have.  Any surgeries you have had.  Any medical  conditions you have.  Whether you are pregnant or may be pregnant. What are the risks? Generally, this is a safe procedure. However, problems may occur, including:  Serious chest pain and heart attack. This is only a risk if the stress portion of the test is done.  Rapid heartbeat.  Sensation of warmth in your chest. This usually passes quickly.  What happens before the procedure?  Ask your health care provider about changing or stopping your regular medicines. This is especially important if you are taking diabetes medicines or blood thinners.  Remove your jewelry on the day of the procedure. What happens during the procedure?  An IV tube will be inserted into one of your veins.  Your health care provider will inject a small amount of radioactive tracer through the tube.  You will wait for 20-40 minutes while the tracer travels through your bloodstream.  Your heart activity will be monitored with an electrocardiogram (ECG).  You will lie down on an exam table.  Images of your heart will be taken for about 15-20 minutes.  You may be asked to exercise on a treadmill or stationary bike. While you exercise, your heart's activity will be monitored with an ECG, and your blood pressure will be checked. If you are unable to exercise, you may be given a medicine to increase blood flow to parts of your heart.  When blood flow to your heart has peaked, a tracer will again be injected through the IV tube.  After 20-40 minutes, you will get back on the exam table and have more images taken of your heart.  When the procedure is over, your IV tube will be removed. The procedure may vary among health care providers and hospitals. Depending on the type of tracer used, scans may need to be repeated 3-4 hours later. What happens after the procedure?  Unless your health care provider tells you otherwise, you may return to your normal schedule, including diet, activities, and  medicines.  Unless your health care provider tells you otherwise, you may increase your fluid intake. This will help flush the contrast dye from your body. Drink enough fluid to keep your urine clear or pale yellow.  It is up to you to get your test results. Ask your health care provider, or the department that is doing the test, when your results will be ready. Summary  A cardiac nuclear scan measures the blood flow to the heart when a person is resting and when he or she is exercising.  You may need this test if you are at risk for heart disease.  Tell your health care provider if you are pregnant.  Unless your health care provider tells you otherwise, increase your fluid intake. This will help flush the contrast dye from your body. Drink enough fluid to keep your urine clear or pale yellow. This information is not intended to replace advice given to you by your health care provider. Make sure you discuss any questions you have with your health care provider. Document Released: 11/16/2004 Document Revised: 10/24/2016 Document Reviewed: 09/30/2013 Elsevier Interactive  Patient Education  2017 [employer].

## 2018-07-02 NOTE — Progress Notes (Signed)
New Outpatient Visit Date: 07/02/2018  Referring Provider: Sherran Needs, NP [place], [place] [address]  Chief Complaint: Atrial fibrillation  HPI:  [name] is a 77 y.o. male who is being seen today for the evaluation of paroxysmal atrial fibrillation at the request of Ms. Arbie Cookey. 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  He was admitted at [facility] in mid June after falling from a ladder.  He was noted to be in atrial fibrillation with rapid ventricular response, which terminated with IV metoprolol.  Anticoagulation was deferred given recent traumatic injuries.  He subsequently followed up in the atrial fibrillation clinic in [place] and underwent an event monitor placement.  Noninvasive ischemia evaluation was also recommended, though this has yet to be performed.  As he lives in [place], Capelli has been referred to general cardiology in [place] for ongoing management.  Today, Mr. Trombetta feels relatively well.  He still has some chest wall soreness and continues to wear a chest brace and C-spine collar.  He is scheduled to follow-up with his neurosurgeon early next month to see if these brace devices can be removed.  He has not had any palpitations nor chest pain with activity.  He also denies shortness of breath, lightheadedness, and orthopnea.  He has noted bilateral leg swelling since his hospitalization in June.  It is slightly better, though he is now noticing some skin changes and burning in his ankles.  He has not had any bleeding and also denies a history of DVT/PE.  --------------------------------------------------------------------------------------------------  Cardiovascular History & Procedures: Cardiovascular Problems:  Paroxysmal atrial fibrillation  Risk Factors:  Hypertension, hyperlipidemia, male gender, and age greater than 87  Cath/PCI:  None  CV  Surgery:  None  EP Procedures and Devices:  30-day event monitor (05/14/2018): Official interpretation pending.  On my review, no significant arrhythmias.  No evidence of atrial fibrillation.  Non-Invasive Evaluation(s):  TTE (04/21/2018): Normal LV size with mild LVH.  LVEF 60 to 65% with normal diastolic function.  Mild mitral regurgitation.  Moderate left atrial enlargement.  Normal RV size and function.  Carotid Doppler (10/25/2017): Moderate proximal right internal carotid artery stenosis (40-59%).  Patent left carotid stent without significant stenosis.  No significant change from prior study on 10/23/2016.  Recent CV Pertinent Labs: Lab Results  Component Value Date   INR 1.06 04/16/2018   INR 1.1 08/11/2013   K 4.0 04/25/2018   K 4.2 08/11/2013   BUN 28 (H) 04/25/2018   BUN 18 08/11/2013   CREATININE 1.29 (H) 04/25/2018   CREATININE 1.43 (H) 08/11/2013    --------------------------------------------------------------------------------------------------  Past Medical History:  Diagnosis Date  . Anxiety   . BPH (benign prostatic hyperplasia)   . Carotid artery disease ([place])    s/p left carotid endarterectomy at [facility] in 2014  . Carotid stenosis    bilateral  . CKD (chronic kidney disease)   . Elevated PSA   . HLD (hyperlipidemia)   . HTN (hypertension)   . Hyperlipidemia   . Hypertension   . Insomnia   . Weak urinary stream     Past Surgical History:  Procedure Laterality Date  . APPENDECTOMY    . carotid stenosis Left    ces and stent placement    Current Meds  Medication Sig  . amLODipine (NORVASC) 5 MG tablet Take by mouth.  [place] Kitchen aspirin 325 MG tablet Take 325 mg by mouth daily. Takes 1/2 a  tablet  . citalopram (CELEXA) 40 MG tablet Take by mouth.  . ferrous sulfate 325 (65 FE) MG tablet Take 325 mg by mouth daily with breakfast.  . finasteride (PROSCAR) 5 MG tablet Take 1 tablet (5 mg total) by mouth daily.  . hydrochlorothiazide (HYDRODIURIL) 25 MG  tablet Take 25 mg by mouth daily.  [place] Kitchen lisinopril (PRINIVIL,ZESTRIL) 30 MG tablet Take 30 mg by mouth daily.  [place] Kitchen lovastatin (MEVACOR) 20 MG tablet Take 20 mg by mouth at bedtime.  . methocarbamol (ROBAXIN) 500 MG tablet Take 1 tablet (500 mg total) by mouth every 8 (eight) hours as needed for muscle spasms.  . sildenafil (VIAGRA) 100 MG tablet Take by mouth.  . tamsulosin (FLOMAX) 0.4 MG CAPS capsule TAKE 1 CAPSULE EVERY DAY  . traZODone (DESYREL) 50 MG tablet Take 50 mg by mouth at bedtime.  . vitamin B-12 (CYANOCOBALAMIN) 1000 MCG tablet Take 1,000 mcg by mouth daily.    Allergies: Patient has no known allergies.  Social History   Tobacco Use  . Smoking status: Former Research scientist (life sciences)  . Smokeless tobacco: Never Used  . Tobacco comment: quit 40 years ago  Substance Use Topics  . Alcohol use: Never    Frequency: Never    Comment: occasional  . Drug use: Never    Family History  Problem Relation Age of Onset  . Stroke Father   . CVA Father   . Diabetes Mellitus II Father   . Prostate cancer Neg Hx   . Kidney disease Neg Hx   . Kidney cancer Neg Hx   . Bladder Cancer Neg Hx     Review of Systems: A 12-system review of systems was performed and was negative except as noted in the HPI.  --------------------------------------------------------------------------------------------------  Physical Exam: BP (!) 150/60 (BP Location: Right Arm, Patient Position: Sitting, Cuff Size: Normal)   Ht 5\' 7"  (1.702 m)   Wt 155 lb 12 oz (70.6 kg)   BMI 24.39 kg/m   General: NAD.  Accompanied by his wife. HEENT: No conjunctival pallor or scleral icterus. Moist mucous membranes. OP clear. Neck: Rigid C-spine collar in place, precluding examination. Lungs: Normal work of breathing. Clear to auscultation bilaterally without wheezes or crackles. Heart: Regular rate and rhythm without murmurs, rubs, or gallops. Non-displaced PMI. Abd: Bowel sounds present. Soft, NT/ND without hepatosplenomegaly Ext:  1+ pretibial edema, left greater than right.  2+ pedal pulses. Skin: Warm and dry.  Mild stasis dermatitis noted on both calves. Psych: Normal mood and affect.  EKG: Normal sinus rhythm with right bundle branch block, borderline LVH, and anterolateral T wave inversions.  No significant change since 05/07/2018.  Lab Results  Component Value Date   WBC 7.5 04/25/2018   HGB 10.5 (L) 04/25/2018   HCT 30.9 (L) 04/25/2018   MCV 94.8 04/25/2018   PLT 192 04/25/2018    Lab Results  Component Value Date   NA 137 04/25/2018   K 4.0 04/25/2018   CL 103 04/25/2018   CO2 27 04/25/2018   BUN 28 (H) 04/25/2018   CREATININE 1.29 (H) 04/25/2018   GLUCOSE 133 (H) 04/25/2018   ALT 31 04/22/2018    No results found for: CHOL, HDL, LDLCALC, LDLDIRECT, TRIG, CHOLHDL   --------------------------------------------------------------------------------------------------  ASSESSMENT AND PLAN: Paroxysmal atrial fibrillation No symptoms of recurrence.  I have personally reviewed the 30-day event monitor, which did not show any evidence of atrial fibrillation.  I suspect that his atrial fibrillation was brought on by his fall and resulting trauma in  June.  Given that he is still recovering from his traumatic injuries and has not had any evidence of recurrence, I think it is reasonable to continue to defer anticoagulation.  Given history of cerebrovascular disease, we will continue with aspirin 162 mg daily.  In the setting of A. fib, age, and EKG abnormalities, I agree that ischemia evaluation with myocardial perfusion stress test is warranted.  Given that Mr. Kaspar is asymptomatic, I will defer this until he is no longer wearing the chest and C-spine braces.  Hopefully, we can schedule his stress test after his upcoming neurosurgery appointment.  Hypertension Blood pressure suboptimally controlled today, though this could be exacerbated by pain and healing injuries.  I would not make any medication changes  today.  I encouraged Mr. Mcnealy to minimize his sodium intake.  Leg swelling Reportedly present since his fall in June.  Given prolonged immobilization in the hospital and multiple fractures, I think it is prudent to exclude DVT.  We will arrange for bilateral lower extremity venous duplex as soon as possible.  Follow-up: Return to clinic in 4-6 weeks.  Nelva Bush, MD 07/02/2018 4:19 PM

## 2018-07-03 ENCOUNTER — Ambulatory Visit (INDEPENDENT_AMBULATORY_CARE_PROVIDER_SITE_OTHER): Payer: Medicare Other

## 2018-07-03 DIAGNOSIS — M7989 Other specified soft tissue disorders: Secondary | ICD-10-CM | POA: Insufficient documentation

## 2018-07-03 DIAGNOSIS — I480 Paroxysmal atrial fibrillation: Secondary | ICD-10-CM | POA: Insufficient documentation

## 2018-07-14 ENCOUNTER — Ambulatory Visit
Admission: RE | Admit: 2018-07-14 | Discharge: 2018-07-14 | Disposition: A | Discharge status: Home / Self Care | Payer: Medicare Other | Source: Ambulatory Visit | Attending: Student | Admitting: Student

## 2018-07-14 DIAGNOSIS — S22030D Wedge compression fracture of third thoracic vertebra, subsequent encounter for fracture with routine healing: Secondary | ICD-10-CM

## 2018-07-28 ENCOUNTER — Other Ambulatory Visit: Payer: Self-pay

## 2018-07-28 ENCOUNTER — Encounter: Payer: Self-pay | Admitting: Urology

## 2018-07-28 ENCOUNTER — Ambulatory Visit (INDEPENDENT_AMBULATORY_CARE_PROVIDER_SITE_OTHER): Payer: Medicare Other | Admitting: Urology

## 2018-07-28 VITALS — BP 162/73 | Ht 67.0 in | Wt 151.9 lb

## 2018-07-28 DIAGNOSIS — N401 Enlarged prostate with lower urinary tract symptoms: Secondary | ICD-10-CM | POA: Diagnosis not present

## 2018-07-28 DIAGNOSIS — N138 Other obstructive and reflux uropathy: Secondary | ICD-10-CM

## 2018-07-28 DIAGNOSIS — I6529 Occlusion and stenosis of unspecified carotid artery: Secondary | ICD-10-CM | POA: Insufficient documentation

## 2018-07-28 DIAGNOSIS — N189 Chronic kidney disease, unspecified: Secondary | ICD-10-CM | POA: Insufficient documentation

## 2018-07-28 DIAGNOSIS — F419 Anxiety disorder, unspecified: Secondary | ICD-10-CM | POA: Insufficient documentation

## 2018-07-28 DIAGNOSIS — N183 Chronic kidney disease, stage 3 unspecified: Secondary | ICD-10-CM | POA: Insufficient documentation

## 2018-07-28 DIAGNOSIS — R739 Hyperglycemia, unspecified: Secondary | ICD-10-CM | POA: Insufficient documentation

## 2018-07-28 MED ORDER — TAMSULOSIN HCL 0.4 MG PO CAPS: 0.4000 mg | ORAL_CAPSULE | Freq: Every day | ORAL | 3 refills | Status: DC

## 2018-07-28 NOTE — Progress Notes (Signed)
9:26 AM   Xavier Davis [birth year] [id number]  Referring provider: Tracie Harrier, [facility] [place], [place] [address]  Chief Complaint  Patient presents with  . Follow-up    HPI: Xavier Davis is a 77 year old Caucasian male with a history of elevated PSA, BPH with LUTS and a history of incomplete bladder emptying who presents today for his 6 months follow up.    History of elevated PSA: Patient has a history of elevated PSA with reaching levels of 8.30 ng/mL in 2014. He subsequently underwent a prostate biopsy and no malignancy was found. He is currently on finasteride 5 mg daily and PSA's are drawn on a six-month basis.  His last PSA was 2.5 ng/mL on 12/19/2015.  He stopped his finasteride at that time, but he has since restarted the medication.  His most recent PSA was 1.2 ng/mL on 06/20/2016.  Due to age and co-morbidities, he has discontinue screening.    BPH WITH LUTS: His IPSS score today is 13, which is moderate lower urinary tract symptomatology.  He is mostly satisfied with his quality life due to his urinary symptoms.  His previous IPSS score was 10/1.  His previous PVR is 0 mL.  He has not complaints at this time.  He denies any dysuria, hematuria or suprapubic pain.  He has found the tamsulosin cost prohibitive.  He has not taken it in two days.  He is taking  finasteride.  His has had a biopsy in 2014.  No malignancy was found.  He also denies any recent fevers, chills, nausea or vomiting.  He does not have a family history of PCa. IPSS    Row Name 07/28/18 0900         International Prostate Symptom Score   How often have you had the sensation of not emptying your bladder?  Less than half the time     How often have you had to urinate less than every two hours?  Less than half the time     How often have you found you stopped and started again several times when you urinated?  About half the time     How often have you found it  difficult to postpone urination?  Not at All     How often have you had a weak urinary stream?  More than half the time     How often have you had to strain to start urination?  Less than 1 in 5 times     How many times did you typically get up at night to urinate?  1 Time     Total IPSS Score  13       Quality of Life due to urinary symptoms   If you were to spend the rest of your life with your urinary condition just the way it is now how would you feel about that?  Mostly Satisfied        Score:  1-7 Mild 8-19 Moderate 20-35 Severe    PMH: Past Medical History:  Diagnosis Date  . Anxiety   . BPH (benign prostatic hyperplasia)   . Carotid artery disease ([place])    s/p left carotid endarterectomy at [facility] in 2014  . Carotid stenosis    bilateral  . CKD (chronic kidney disease)   . Elevated PSA   . Fracture, thoracic vertebra ([place]) 05/06/2018   T3,T9 endplate fracture  . Hemothorax on left 05/06/2018  . HLD (  hyperlipidemia)   . HTN (hypertension)   . Hyperlipidemia   . Hypertension   . Insomnia   . Rib fractures 05/06/2018  . Weak urinary stream     Surgical History: Past Surgical History:  Procedure Laterality Date  . APPENDECTOMY    . carotid stenosis Left    ces and stent placement    Home Medications:  Allergies as of 07/28/2018   No Known Allergies     Medication List        Accurate as of 07/28/18  9:26 AM. Always use your most recent med list.          amLODipine 5 MG tablet Commonly known as:  NORVASC Take by mouth.   aspirin 325 MG tablet Take 325 mg by mouth daily. Takes 1/2 a tablet   citalopram 40 MG tablet Commonly known as:  CELEXA Take by mouth.   ferrous sulfate 325 (65 FE) MG tablet Take 325 mg by mouth daily with breakfast.   finasteride 5 MG tablet Commonly known as:  PROSCAR Take 1 tablet (5 mg total) by mouth daily.   hydrochlorothiazide 25 MG tablet Commonly known as:  HYDRODIURIL Take 25 mg by mouth daily.     lisinopril 30 MG tablet Commonly known as:  PRINIVIL,ZESTRIL Take 30 mg by mouth daily.   lovastatin 20 MG tablet Commonly known as:  MEVACOR Take 20 mg by mouth at bedtime.   methocarbamol 500 MG tablet Commonly known as:  ROBAXIN Take 1 tablet (500 mg total) by mouth every 8 (eight) hours as needed for muscle spasms.   sildenafil 100 MG tablet Commonly known as:  VIAGRA Take by mouth.   tamsulosin 0.4 MG Caps capsule Commonly known as:  FLOMAX Take 1 capsule (0.4 mg total) by mouth daily.   traZODone 50 MG tablet Commonly known as:  DESYREL Take 50 mg by mouth at bedtime.   vitamin B-12 1000 MCG tablet Commonly known as:  CYANOCOBALAMIN Take 1,000 mcg by mouth daily.       Allergies: No Known Allergies  Family History: Family History  Problem Relation Age of Onset  . Stroke Father   . CVA Father   . Diabetes Mellitus II Father   . Prostate cancer Neg Hx   . Kidney disease Neg Hx   . Kidney cancer Neg Hx   . Bladder Cancer Neg Hx     Social History:  reports that he has quit smoking. He has never used smokeless tobacco. He reports that he drinks about 4.0 standard drinks of alcohol per week. He reports that he does not use drugs.  ROS: UROLOGY Frequent Urination?: No Hard to postpone urination?: No Burning/pain with urination?: No Get up at night to urinate?: No Leakage of urine?: No Urine stream starts and stops?: No Trouble starting stream?: No Do you have to strain to urinate?: No Blood in urine?: No Urinary tract infection?: No Sexually transmitted disease?: No Injury to kidneys or bladder?: No Painful intercourse?: No Weak stream?: No Erection problems?: No Penile pain?: No  Gastrointestinal Nausea?: No Vomiting?: No Indigestion/heartburn?: No Diarrhea?: No Constipation?: No  Constitutional Fever: No Night sweats?: No Weight loss?: No Fatigue?: No  Skin Skin rash/lesions?: No Itching?: No  Eyes Blurred vision?: No Double  vision?: No  Ears/Nose/Throat Sore throat?: No Sinus problems?: No  Hematologic/Lymphatic Swollen glands?: No Easy bruising?: No  Cardiovascular Leg swelling?: No Chest pain?: No  Respiratory Cough?: No Shortness of breath?: No  Endocrine Excessive thirst?: No  Musculoskeletal  Back pain?: Yes Joint pain?: Yes  Neurological Headaches?: No Dizziness?: No  Psychologic Depression?: No Anxiety?: No  Physical Exam: BP (!) 162/73 (BP Location: Left Arm, Patient Position: Sitting, Cuff Size: Normal)   Ht 5\' 7"  (1.702 m)   Wt 151 lb 14.4 oz (68.9 kg)   BMI 23.79 kg/m   Constitutional: Well nourished. Alert and oriented, No acute distress. HEENT: [place] AT, moist mucus membranes. Trachea midline, no masses. Cardiovascular: No clubbing, cyanosis, or edema. Respiratory: Normal respiratory effort, no increased work of breathing. GI: Abdomen is soft, non tender, non distended, no abdominal masses. Liver and spleen not palpable.  No hernias appreciated.  Stool sample for occult testing is not indicated.   GU: No CVA tenderness.  No bladder fullness or masses.  Patient with circumcised phallus.  Urethral meatus is patent.  No penile discharge. No penile lesions or rashes. Scrotum without lesions, cysts, rashes and/or edema.  Testicles are located scrotally bilaterally. No masses are appreciated in the testicles. Left and right epididymis are normal. Rectal: Patient with  normal sphincter tone. Anus and perineum without scarring or rashes. No rectal masses are appreciated. Prostate is approximately 60 grams, could only palpate the apex and midportion of glands, no nodules are appreciated.  Skin: No rashes, bruises or suspicious lesions. Lymph: No cervical or inguinal adenopathy. Neurologic: Grossly intact, no focal deficits, moving all 4 extremities. Psychiatric: Normal mood and affect.   Laboratory Data:  PSA history:  4.1 ng/mL on 06/22/2014             4.2 ng/mL on  12/23/2014  1.7 ng/mL on 06/23/2015-stopped taking the finasteride  2.5 ng/mL on 12/19/2015  1.2 ng/mL on 06/20/2016 I have reviewed the labs.   Assessment & Plan:    1. BPH with LUTS IPSS score is 13/2, it is worsening Continue conservative management, avoiding bladder irritants and timed voiding's Continue finasteride 5 mg daily and tamsulosin 0.4 mg  RTC in 12 months for IPSS and exam    Return in about 1 year (around 07/29/2019) for I PSS and exam .  Zara Council, [facility]  [address] [place] [facility] [place], [place] [address] [phone]

## 2018-07-30 DIAGNOSIS — M51369 Other intervertebral disc degeneration, lumbar region without mention of lumbar back pain or lower extremity pain: Secondary | ICD-10-CM | POA: Insufficient documentation

## 2018-07-30 DIAGNOSIS — M5136 Other intervertebral disc degeneration, lumbar region: Secondary | ICD-10-CM | POA: Insufficient documentation

## 2018-08-01 ENCOUNTER — Telehealth: Payer: Self-pay | Admitting: Internal Medicine

## 2018-08-01 NOTE — Telephone Encounter (Signed)
-----   Message from Emily Filbert, RN sent at 07/31/2018 10:35 AM EDT ----- Marykay Lex ladies,  Not sure why this patient isn't scheduled for his myoview. Do you mind touching base with him to try to schedule please!? Thanks! ----- Message ----- From: Britt Bottom, CMA Sent: 07/30/2018   3:10 PM EDT To: [facility] Triage  Good afternoon , Pt hasn't had Myoview scheduled/completed. Pt has upcoming appointment w/[facility] 08/13/2018. Pt has completed echo/event monitor and lower extremity US.   Please advise if pt needs to have Myoview complete before appointment.  Thank you!

## 2018-08-01 NOTE — Telephone Encounter (Signed)
Attempted to call patient and schedule  No vm no answer Will try again at a later time

## 2018-08-04 NOTE — Telephone Encounter (Signed)
Patient states he is not ready to schedule the myoview he will call us back to schedule that exam and then schedule the fu   Will await his call back

## 2018-08-04 NOTE — Telephone Encounter (Signed)
Routing to Dr End to make him aware.

## 2018-08-13 ENCOUNTER — Ambulatory Visit: Payer: Medicare Other | Admitting: Nurse Practitioner

## 2018-08-26 ENCOUNTER — Other Ambulatory Visit: Payer: Self-pay | Admitting: Orthopedic Surgery

## 2018-08-26 DIAGNOSIS — M5442 Lumbago with sciatica, left side: Principal | ICD-10-CM

## 2018-08-26 DIAGNOSIS — G8929 Other chronic pain: Secondary | ICD-10-CM

## 2018-09-12 ENCOUNTER — Ambulatory Visit
Admission: RE | Admit: 2018-09-12 | Discharge: 2018-09-12 | Disposition: A | Discharge status: Home / Self Care | Payer: Medicare Other | Source: Ambulatory Visit | Attending: Orthopedic Surgery | Admitting: Orthopedic Surgery

## 2018-09-12 DIAGNOSIS — G8929 Other chronic pain: Secondary | ICD-10-CM | POA: Diagnosis not present

## 2018-09-12 DIAGNOSIS — M4807 Spinal stenosis, lumbosacral region: Secondary | ICD-10-CM | POA: Insufficient documentation

## 2018-09-12 DIAGNOSIS — M5442 Lumbago with sciatica, left side: Secondary | ICD-10-CM

## 2018-09-12 DIAGNOSIS — M5126 Other intervertebral disc displacement, lumbar region: Secondary | ICD-10-CM | POA: Insufficient documentation

## 2018-10-31 ENCOUNTER — Ambulatory Visit (INDEPENDENT_AMBULATORY_CARE_PROVIDER_SITE_OTHER): Payer: Medicare Other

## 2018-10-31 ENCOUNTER — Ambulatory Visit (INDEPENDENT_AMBULATORY_CARE_PROVIDER_SITE_OTHER): Payer: Medicare Other | Admitting: Vascular Surgery

## 2018-10-31 ENCOUNTER — Encounter (INDEPENDENT_AMBULATORY_CARE_PROVIDER_SITE_OTHER): Payer: Self-pay | Admitting: Vascular Surgery

## 2018-10-31 ENCOUNTER — Encounter (INDEPENDENT_AMBULATORY_CARE_PROVIDER_SITE_OTHER): Payer: Medicare Other

## 2018-10-31 VITALS — BP 152/70 | HR 74 | Resp 14 | Ht 67.0 in | Wt 156.0 lb

## 2018-10-31 DIAGNOSIS — I6523 Occlusion and stenosis of bilateral carotid arteries: Secondary | ICD-10-CM

## 2018-10-31 DIAGNOSIS — Z87891 Personal history of nicotine dependence: Secondary | ICD-10-CM | POA: Diagnosis not present

## 2018-10-31 DIAGNOSIS — E785 Hyperlipidemia, unspecified: Secondary | ICD-10-CM

## 2018-10-31 DIAGNOSIS — I10 Essential (primary) hypertension: Secondary | ICD-10-CM | POA: Diagnosis not present

## 2018-10-31 NOTE — Assessment & Plan Note (Signed)
Carotid duplex today shows a patent left carotid artery stent without significant recurrent stenosis.  His right ICA velocities have increased and now fall in the lower end of the 60 to 79% range.  Previously, these were in the 40 to 59% range. Although this does not follow into a surgical range, we will need to shorten his follow-up interval and I will plan to see him back in 6 months.  He will continue his aspirin and statin agent.

## 2018-10-31 NOTE — Patient Instructions (Signed)
Carotid Artery Disease  The carotid arteries are arteries on both sides of the neck. They carry blood to the brain, face, and neck. Carotid artery disease happens when these arteries become smaller (narrow) or get blocked. If these arteries become smaller or get blocked, you are more likely to have a stroke or a warning stroke (transient ischemic attack). Follow these instructions at home:  Take over-the-counter and prescription medicines only as told by your doctor.  Make sure you understand all instructions about your medicines. Do not stop taking your medicines without talking to your doctor first.  Follow your doctor's diet instructions. It is important to follow a healthy diet. ? Eat foods that include plenty of: ? Fresh fruits. ? Vegetables. ? Lean meats. ? Avoid these foods: ? Foods that are high in fat. ? Foods that are high in salt (sodium). ? Foods that are fried. ? Foods that are processed. ? Foods that have few good nutrients (poor nutritional value).  Keep a healthy weight.  Stay active. Get at least 30 minutes of activity every day.  Do not smoke.  Limit alcohol use to: ? No more than 2 drinks a day for men. ? No more than 1 drink a day for women who are not pregnant.  Do not use illegal drugs.  Keep all follow-up visits as told by your doctor. This is important. Contact a doctor if: Get help right away if:  You have any symptoms of stroke or TIA. The acronym BEFAST is an easy way to remember the main warning signs of stroke. ? B = Balance problems. Signs include dizziness, sudden trouble walking, or loss of balance ? E = Eye problems. This includes trouble seeing or a sudden change in vision. ? F = Face changes. This includes sudden weakness or numbness of the face, or the face or eyelid drooping to one side. ? A = Arm weakness or numbness. This happens suddenly and usually on one side of the body. ? S = Speech problems. This includes trouble speaking or  trouble understanding. ? T = Time. Time to call 911 or seek emergency care. Do not wait to see if symptoms go away. Make note of the time your symptoms started.  Other signs of stroke may include: ? A sudden, severe headache with no known cause. ? Feeling sick to your stomach (nauseous) or throwing up (vomiting). ? Seizure. Call your local emergency services (911 in [place] Do notdrive yourself to the clinic or hospital. Summary  The carotid arteries are arteries on both sides of the neck.  If these arteries get smaller or get blocked, you are more likely to have a stroke or a warning stroke (transient ischemic attack).  Take over-the-counter and prescription medicines only as told by your doctor.  Keep all follow-up visits as told by your doctor. This is important. This information is not intended to replace advice given to you by your health care provider. Make sure you discuss any questions you have with your health care provider. Document Released: 10/08/2012 Document Revised: 10/17/2017 Document Reviewed: 10/17/2017 Elsevier Interactive Patient Education  2019 Elsevier Inc.  

## 2018-10-31 NOTE — Progress Notes (Signed)
MRN : [id number]  Xavier Davis is a 77 y.o. (1941/07/15) male who presents with chief complaint of  Chief Complaint  Patient presents with  . Follow-up  .  History of Present Illness: Patient returns in follow-up of his carotid disease.  He is about 5 years status post left carotid artery stent placement for high-grade recurrent stenosis after a remote previous endarterectomy at the [place].  He is doing well.  He denies any focal neurologic symptoms. Specifically, the patient denies amaurosis fugax, speech or swallowing difficulties, or arm or leg weakness or numbness.  Carotid duplex today shows a patent left carotid artery stent without significant recurrent stenosis.  His right ICA velocities have increased and now fall in the lower end of the 60 to 79% range.  Previously, these were in the 40 to 59% range.  Current Outpatient Medications  Medication Sig Dispense Refill  . amLODipine (NORVASC) 5 MG tablet Take by mouth.    Marland Kitchen aspirin 325 MG tablet Take 325 mg by mouth daily. Takes 1/2 a tablet    . citalopram (CELEXA) 40 MG tablet Take by mouth.    . ferrous sulfate 325 (65 FE) MG tablet Take 325 mg by mouth daily with breakfast.    . hydrochlorothiazide (HYDRODIURIL) 25 MG tablet Take 25 mg by mouth daily.    Marland Kitchen lisinopril (PRINIVIL,ZESTRIL) 30 MG tablet Take 30 mg by mouth daily.    Marland Kitchen lovastatin (MEVACOR) 20 MG tablet Take 20 mg by mouth at bedtime.    . tamsulosin (FLOMAX) 0.4 MG CAPS capsule Take 1 capsule (0.4 mg total) by mouth daily. 90 capsule 3  . traZODone (DESYREL) 50 MG tablet Take 50 mg by mouth at bedtime.    . vitamin B-12 (CYANOCOBALAMIN) 1000 MCG tablet Take 1,000 mcg by mouth daily.     No current facility-administered medications for this visit.     Past Medical History:  Diagnosis Date  . Anxiety   . BPH (benign prostatic hyperplasia)   . Carotid artery disease ([place])    s/p left carotid endarterectomy at [facility] in 2014  . Carotid stenosis    bilateral  . CKD  (chronic kidney disease)   . Elevated PSA   . Fracture, thoracic vertebra ([place]) 05/06/2018   T3,T9 endplate fracture  . Hemothorax on left 05/06/2018  . HLD (hyperlipidemia)   . HTN (hypertension)   . Hyperlipidemia   . Hypertension   . Insomnia   . Rib fractures 05/06/2018  . Weak urinary stream     Past Surgical History:  Procedure Laterality Date  . APPENDECTOMY    . carotid stenosis Left    ces and stent placement     Social History        Tobacco Use  . Smoking status: Former Research scientist (life sciences)  . Smokeless tobacco: Former Systems developer  . Tobacco comment: quit 40 years ago  Substance Use Topics  . Alcohol use: Yes    Alcohol/week: 0.0 oz    Comment: occasional  . Drug use: No           Family History  Problem Relation Age of Onset  . CVA Father   . Diabetes Mellitus II Father   . Prostate cancer Neg Hx   . Kidney disease Neg Hx   . Kidney cancer Neg Hx   . Bladder Cancer Neg Hx      No Known Allergies   REVIEW OF SYSTEMS (Negative unless checked)  Constitutional: [] ?Weight loss  [] ?Fever  [] ?Chills  Cardiac: [] ?Chest pain   [] ?Chest pressure   [] ?Palpitations   [] ?Shortness of breath when laying flat   [] ?Shortness of breath at rest   [] ?Shortness of breath with exertion. Vascular:  [] ?Pain in legs with walking   [] ?Pain in legs at rest   [] ?Pain in legs when laying flat   [] ?Claudication   [] ?Pain in feet when walking  [] ?Pain in feet at rest  [] ?Pain in feet when laying flat   [] ?History of DVT   [] ?Phlebitis   [] ?Swelling in legs   [] ?Varicose veins   [] ?Non-healing ulcers Pulmonary:   [] ?Uses home oxygen   [] ?Productive cough   [] ?Hemoptysis   [] ?Wheeze  [] ?COPD   [] ?Asthma Neurologic:  [] ?Dizziness  [] ?Blackouts   [] ?Seizures   [] ?History of stroke   [] ?History of TIA  [] ?Aphasia   [] ?Temporary blindness   [] ?Dysphagia   [] ?Weakness or numbness in arms   [] ?Weakness or numbness in legs Musculoskeletal:  [x] ?Arthritis   [] ?Joint swelling   [] ?Joint  pain   [] ?Low back pain Hematologic:  [] ?Easy bruising  [] ?Easy bleeding   [] ?Hypercoagulable state   [] ?Anemic  [] ?Hepatitis Gastrointestinal:  [] ?Blood in stool   [] ?Vomiting blood  [] ?Gastroesophageal reflux/heartburn   [] ?Difficulty swallowing. Genitourinary:  [] ?Chronic kidney disease   [x] ?Difficult urination  [] ?Frequent urination  [] ?Burning with urination   [] ?Blood in urine Skin:  [] ?Rashes   [] ?Ulcers   [] ?Wounds Psychological:  [] ?History of anxiety   [] ? History of major depression.    Physical Examination  Vitals:   10/31/18 0924  BP: (!) 152/70  Pulse: 74  Resp: 14  Weight: 156 lb (70.8 kg)  Height: 5\' 7"  (1.702 m)   Body mass index is 24.43 kg/m. Gen:  WD/WN, NAD Head: [place]/AT, No temporalis wasting. Ear/Nose/Throat: Hearing grossly intact, nares w/o erythema or drainage, trachea midline Eyes: Conjunctiva clear. Sclera non-icteric Neck: Supple.  Soft bilateral carotid bruit  Pulmonary:  Good air movement, equal and clear to auscultation bilaterally.  Cardiac: RRR, No JVD Vascular:  Vessel Right Left  Radial Palpable Palpable                                    Musculoskeletal: M/S 5/5 throughout.  No deformity or atrophy.  No edema. Neurologic: CN 2-12 intact. Sensation grossly intact in extremities.  Symmetrical.  Speech is fluent. Motor exam as listed above. Psychiatric: Judgment intact, Mood & affect appropriate for pt's clinical situation. Dermatologic: No rashes or ulcers noted.  No cellulitis or open wounds.      CBC Lab Results  Component Value Date   WBC 7.5 04/25/2018   HGB 10.5 (L) 04/25/2018   HCT 30.9 (L) 04/25/2018   MCV 94.8 04/25/2018   PLT 192 04/25/2018    BMET    Component Value Date/Time   NA 137 04/25/2018 0255   NA 139 08/11/2013 0406   K 4.0 04/25/2018 0255   K 4.2 08/11/2013 0406   CL 103 04/25/2018 0255   CL 107 08/11/2013 0406   CO2 27 04/25/2018 0255   CO2 29 08/11/2013 0406   GLUCOSE 133 (H) 04/25/2018  0255   GLUCOSE 101 (H) 08/11/2013 0406   BUN 28 (H) 04/25/2018 0255   BUN 18 08/11/2013 0406   CREATININE 1.29 (H) 04/25/2018 0255   CREATININE 1.43 (H) 08/11/2013 0406   CALCIUM 8.6 (L) 04/25/2018 0255   CALCIUM 8.7 08/11/2013 0406   GFRNONAA 52 (L) 04/25/2018 0255  GFRNONAA 49 (L) 08/11/2013 0406   GFRAA >60 04/25/2018 0255   GFRAA 56 (L) 08/11/2013 0406   CrCl cannot be calculated (Patient's most recent lab result is older than the maximum 21 days allowed.).  COAG Lab Results  Component Value Date   INR 1.06 04/16/2018   INR 1.1 08/11/2013    Radiology No results found.    Assessment/Plan Essential hypertension blood pressure control important in reducing the progression of atherosclerotic disease. On appropriate oral medications, but his control is suboptimal.  We discussed  potentially increasing the dose of his Norvasc from 5 mg to 10 mg daily.  He is also going to contact his primary care physician about this.  He is also already on lisinopril and HCTZ.  If his blood pressure continues to worsen, I would consider a renal artery duplex.   Hyperlipidemia lipid control important in reducing the progression of atherosclerotic disease. Continue statin therapy  Bilateral carotid artery stenosis Carotid duplex today shows a patent left carotid artery stent without significant recurrent stenosis.  His right ICA velocities have increased and now fall in the lower end of the 60 to 79% range.  Previously, these were in the 40 to 59% range. Although this does not follow into a surgical range, we will need to shorten his follow-up interval and I will plan to see him back in 6 months.  He will continue his aspirin and statin agent.    Leotis Pain, MD  10/31/2018 10:57 AM    This note was created with Dragon medical transcription system.  Any errors from dictation are purely unintentional

## 2019-05-01 ENCOUNTER — Encounter (INDEPENDENT_AMBULATORY_CARE_PROVIDER_SITE_OTHER): Payer: Self-pay

## 2019-05-01 ENCOUNTER — Other Ambulatory Visit: Payer: Self-pay

## 2019-05-01 ENCOUNTER — Ambulatory Visit (INDEPENDENT_AMBULATORY_CARE_PROVIDER_SITE_OTHER): Payer: Medicare Other

## 2019-05-01 ENCOUNTER — Ambulatory Visit (INDEPENDENT_AMBULATORY_CARE_PROVIDER_SITE_OTHER): Payer: Medicare Other | Admitting: Nurse Practitioner

## 2019-05-01 DIAGNOSIS — I6523 Occlusion and stenosis of bilateral carotid arteries: Secondary | ICD-10-CM | POA: Diagnosis not present

## 2019-05-04 ENCOUNTER — Other Ambulatory Visit (INDEPENDENT_AMBULATORY_CARE_PROVIDER_SITE_OTHER): Payer: Self-pay | Admitting: Vascular Surgery

## 2019-05-04 DIAGNOSIS — I6523 Occlusion and stenosis of bilateral carotid arteries: Secondary | ICD-10-CM

## 2019-05-05 ENCOUNTER — Encounter (INDEPENDENT_AMBULATORY_CARE_PROVIDER_SITE_OTHER): Payer: Self-pay | Admitting: Vascular Surgery

## 2019-07-28 NOTE — Progress Notes (Signed)
8:38 AM   [name] 1941/08/19 [id number]  Referring provider: Tracie Harrier, MD [address] [facility] [place],  [name] [address]  Chief Complaint  Patient presents with  . Benign Prostatic Hypertrophy    HPI: [name] is a 78 year old male with a history of elevated PSA, BPH with LUTS and a history of incomplete bladder emptying who presents today for his 12 months follow up.    History of elevated PSA: Patient has a history of elevated PSA with reaching levels of 8.30 ng/mL in 2014. He subsequently underwent a prostate biopsy and no malignancy was found. He is currently on finasteride 5 mg daily and PSA's are drawn on a six-month basis.  His last PSA was 2.5 ng/mL on 12/19/2015.  He stopped his finasteride at that time, but he has since restarted the medication.  His most recent PSA was 1.2 ng/mL on 06/20/2016.  Due to age and co-morbidities, he has discontinue screening.    BPH WITH LUTS: His IPSS score today is 8, which is moderate lower urinary tract symptomatology.  He is pleased with his quality life due to his urinary symptoms.  His previous IPSS score was 13/2.   His previous PVR is 0 mL.  His main complaints are nocturia and urge incontinence.   He states the urge incontinence is mild.  He had a negative biopsy in 2014.  He is taking finasteride 5 mg and tamsulosin o.4 mg daily.  Patient denies any gross hematuria, dysuria or suprapubic/flank pain.  Patient denies any fevers, chills, nausea or vomiting.  IPSS    Row Name 07/29/19 0800         International Prostate Symptom Score   How often have you had the sensation of not emptying your bladder?  Less than 1 in 5     How often have you had to urinate less than every two hours?  Less than 1 in 5 times     How often have you found you stopped and started again several times when you urinated?  Less than half the time     How often have you found it difficult to postpone urination?  Less than  1 in 5 times     How often have you had a weak urinary stream?  Less than half the time     How often have you had to strain to start urination?  Not at All     How many times did you typically get up at night to urinate?  1 Time     Total IPSS Score  8       Quality of Life due to urinary symptoms   If you were to spend the rest of your life with your urinary condition just the way it is now how would you feel about that?  Pleased        Score:  1-7 Mild 8-19 Moderate 20-35 Severe  ED/Ejaculatory disorder He states he is losing his erection during intercourse and is unable to ejaculate.  He take Viagra for the erections.  He does not have penile curvature or pain with erections.    PMH: Past Medical History:  Diagnosis Date  . Anxiety   . BPH (benign prostatic hyperplasia)   . Carotid artery disease ([place])    s/p left carotid endarterectomy at [facility] in 2014  . Carotid stenosis    bilateral  . CKD (chronic kidney disease)   . Elevated PSA   .  Fracture, thoracic vertebra ([place]) 05/06/2018   T3,T9 endplate fracture  . Hemothorax on left 05/06/2018  . HLD (hyperlipidemia)   . HTN (hypertension)   . Hyperlipidemia   . Hypertension   . Insomnia   . Rib fractures 05/06/2018  . Weak urinary stream     Surgical History: Past Surgical History:  Procedure Laterality Date  . APPENDECTOMY    . carotid stenosis Left    ces and stent placement    Home Medications:  Allergies as of 07/29/2019   No Known Allergies     Medication List       Accurate as of July 29, 2019  8:38 AM. If you have any questions, ask your nurse or doctor.        STOP taking these medications   hydrochlorothiazide 25 MG tablet Commonly known as: HYDRODIURIL Stopped by: Angline Schweigert, PA-C     TAKE these medications   amLODipine 5 MG tablet Commonly known as: NORVASC Take by mouth.   aspirin 325 MG tablet Take 325 mg by mouth daily. Takes 1/2 a tablet   citalopram 40 MG tablet  Commonly known as: CELEXA Take by mouth.   ferrous sulfate 325 (65 FE) MG tablet Take 325 mg by mouth daily with breakfast.   finasteride 5 MG tablet Commonly known as: PROSCAR Take by mouth.   lisinopril 30 MG tablet Commonly known as: ZESTRIL Take 40 mg by mouth daily.   lovastatin 20 MG tablet Commonly known as: MEVACOR Take 20 mg by mouth at bedtime.   tamsulosin 0.4 MG Caps capsule Commonly known as: FLOMAX Take 1 capsule (0.4 mg total) by mouth daily.   traZODone 50 MG tablet Commonly known as: DESYREL Take 50 mg by mouth at bedtime.   vitamin B-12 1000 MCG tablet Commonly known as: CYANOCOBALAMIN Take 1,000 mcg by mouth daily.       Allergies: No Known Allergies  Family History: Family History  Problem Relation Age of Onset  . Stroke Father   . CVA Father   . Diabetes Mellitus II Father   . Prostate cancer Neg Hx   . Kidney disease Neg Hx   . Kidney cancer Neg Hx   . Bladder Cancer Neg Hx     Social History:  reports that he has quit smoking. He has never used smokeless tobacco. He reports current alcohol use of about 4.0 standard drinks of alcohol per week. He reports that he does not use drugs.  ROS: UROLOGY Frequent Urination?: No Hard to postpone urination?: No Burning/pain with urination?: No Get up at night to urinate?: Yes Leakage of urine?: Yes Urine stream starts and stops?: No Trouble starting stream?: No Do you have to strain to urinate?: No Blood in urine?: No Urinary tract infection?: No Sexually transmitted disease?: No Injury to kidneys or bladder?: No Painful intercourse?: No Weak stream?: No Erection problems?: No Penile pain?: No  Gastrointestinal Nausea?: No Vomiting?: No Indigestion/heartburn?: No Diarrhea?: No Constipation?: No  Constitutional Fever: No Night sweats?: No Weight loss?: No Fatigue?: No  Skin Skin rash/lesions?: No Itching?: No  Eyes Blurred vision?: No Double vision?: No   Ears/Nose/Throat Sore throat?: No Sinus problems?: No  Hematologic/Lymphatic Swollen glands?: No Easy bruising?: No  Cardiovascular Leg swelling?: No Chest pain?: No  Respiratory Cough?: No Shortness of breath?: No  Endocrine Excessive thirst?: No  Musculoskeletal Back pain?: No Joint pain?: No  Neurological Headaches?: No Dizziness?: No  Psychologic Depression?: No Anxiety?: No  Physical Exam: There were no  vitals taken for this visit.  Constitutional:  Well nourished. Alert and oriented, No acute distress. HEENT: [place] AT, moist mucus membranes.  Trachea midline, no masses. Cardiovascular: No clubbing, cyanosis, or edema. Respiratory: Normal respiratory effort, no increased work of breathing. GI: Abdomen is soft, non tender, non distended, no abdominal masses. Liver and spleen not palpable.  Umbilical hernia appreciated.  Stool sample for occult testing is not indicated.   GU: No CVA tenderness.  No bladder fullness or masses.  Patient with circumcised phallus.   Urethral meatus is patent.  No penile discharge. No penile lesions or rashes. Scrotum without lesions, cysts, rashes and/or edema.  Testicles are located scrotally bilaterally. No masses are appreciated in the testicles. Left and right epididymis are normal. Rectal: Patient with  normal sphincter tone. Anus and perineum without scarring or rashes. No rectal masses are appreciated. Prostate is approximately 60 grams, could only palpate the apex and the midportion of the gland, no nodules are appreciated. Seminal vesicles could not be palpated Skin: No rashes, bruises or suspicious lesions. Lymph: No inguinal adenopathy. Neurologic: Grossly intact, no focal deficits, moving all 4 extremities. Psychiatric: Normal mood and affect.   Laboratory Data:  PSA history:  4.1 ng/mL on 06/22/2014             4.2 ng/mL on 12/23/2014  1.7 ng/mL on 06/23/2015-stopped taking the finasteride  2.5 ng/mL on 12/19/2015  1.2  ng/mL on 06/20/2016 I have reviewed the labs.   Assessment & Plan:    1. BPH with LUTS IPSS score is 8/1, it is improving  Continue conservative management, avoiding bladder irritants and timed voiding's He is not finding the urge incontinence bothersome at this time  Continue finasteride 5 mg daily and tamsulosin 0.4 mg - refills given  RTC in 12 months for IPSS and exam   2. ED/Ejaculatory disorder  Patient and I discussed the mechanism of action of his tamsulosin and finasteride.  I explained to him that the tamsulosin is in alpha-blocker which relaxes the smooth muscle encircling the prostate and bladder neck.  This results in an opening of the prostatic urethra and bladder neck facilitating emptying of the bladder.  The finasteride blocks the testosterone stimulation of the prostate therefore maintaining the size of the prostate or in some cases decreasing the size of the prostate.  It is true that some patients lower urinary tract symptoms can be managed with 1 or the other, but like blood pressure medications, some patients will need to address their lower urinary tract symptoms with the 2 different mechanisms of actions.  In relation to the ejaculatory disorder, I had a frank discussion with the patient that as a man ages and the prostate becomes enlarged, it can be expected that the amount of semen and a forceful ejaculation can be reduced.  This can occur even without medication as the prostate will enlarge as a man ages.  He has the options to stop the tamsulosin and finasteride and see if his sexual function and ejaculatory disorders improve, but his urinary symptoms may worsen.  A bladder outlet procedure may be an option to improve his lower urinary tract symptoms, but these procedures run the risk of ejaculatory disorders and erectile dysfunction.   We discussed switching to Trimix for his erections as he not be able to reach climax due to losing the erection during intercourse.  He  would like to think on this and will discuss this at his next visit.    Return  in about 1 year (around 07/28/2020) for I PSS and exam  .  Zara Council, [facility]  [place] [address] [place], [place] [id number] [phone]

## 2019-07-29 ENCOUNTER — Encounter: Payer: Self-pay | Admitting: Urology

## 2019-07-29 ENCOUNTER — Other Ambulatory Visit: Payer: Self-pay

## 2019-07-29 ENCOUNTER — Ambulatory Visit (INDEPENDENT_AMBULATORY_CARE_PROVIDER_SITE_OTHER): Payer: Medicare Other | Admitting: Urology

## 2019-07-29 VITALS — BP 171/70 | HR 69 | Wt 149.5 lb

## 2019-07-29 DIAGNOSIS — N5314 Retrograde ejaculation: Secondary | ICD-10-CM | POA: Diagnosis not present

## 2019-07-29 DIAGNOSIS — I6523 Occlusion and stenosis of bilateral carotid arteries: Secondary | ICD-10-CM | POA: Diagnosis not present

## 2019-07-29 DIAGNOSIS — N529 Male erectile dysfunction, unspecified: Secondary | ICD-10-CM

## 2019-07-29 DIAGNOSIS — N138 Other obstructive and reflux uropathy: Secondary | ICD-10-CM | POA: Diagnosis not present

## 2019-07-29 DIAGNOSIS — N401 Enlarged prostate with lower urinary tract symptoms: Secondary | ICD-10-CM

## 2019-07-29 MED ORDER — TAMSULOSIN HCL 0.4 MG PO CAPS: 0.4000 mg | ORAL_CAPSULE | Freq: Every day | ORAL | 3 refills | Status: DC

## 2019-11-03 ENCOUNTER — Ambulatory Visit (INDEPENDENT_AMBULATORY_CARE_PROVIDER_SITE_OTHER): Payer: Medicare Other

## 2019-11-03 ENCOUNTER — Other Ambulatory Visit: Payer: Self-pay

## 2019-11-03 ENCOUNTER — Ambulatory Visit (INDEPENDENT_AMBULATORY_CARE_PROVIDER_SITE_OTHER): Payer: Medicare Other | Admitting: Vascular Surgery

## 2019-11-03 ENCOUNTER — Encounter (INDEPENDENT_AMBULATORY_CARE_PROVIDER_SITE_OTHER): Payer: Self-pay | Admitting: Vascular Surgery

## 2019-11-03 ENCOUNTER — Encounter (INDEPENDENT_AMBULATORY_CARE_PROVIDER_SITE_OTHER): Payer: Self-pay

## 2019-11-03 VITALS — BP 185/75 | HR 66 | Resp 17 | Ht 67.0 in | Wt 149.0 lb

## 2019-11-03 DIAGNOSIS — I6523 Occlusion and stenosis of bilateral carotid arteries: Secondary | ICD-10-CM

## 2019-11-03 DIAGNOSIS — E785 Hyperlipidemia, unspecified: Secondary | ICD-10-CM | POA: Diagnosis not present

## 2019-11-03 DIAGNOSIS — I10 Essential (primary) hypertension: Secondary | ICD-10-CM | POA: Diagnosis not present

## 2019-11-03 NOTE — Progress Notes (Signed)
MRN : [id number]  Xavier Davis is a 78 y.o. ([birth year]) male who presents with chief complaint of  Chief Complaint  Patient presents with  . Follow-up  .  History of Present Illness: Patient returns in follow-up.  He has no new complaints today.  He denies any focal neurologic symptoms.  He is about 6 years status post left carotid stent placement.  His duplex today shows a patent left carotid stent without significant restenosis.  His right carotid artery stenosis is in the upper end of the 60 to 79% range and velocities have increased some on follow-up.  Current Outpatient Medications  Medication Sig Dispense Refill  . amLODipine (NORVASC) 5 MG tablet Take by mouth.    Marland Kitchen aspirin 325 MG tablet Take 325 mg by mouth daily. Takes 1/2 a tablet    . ferrous sulfate 325 (65 FE) MG tablet Take 325 mg by mouth daily with breakfast.    . finasteride (PROSCAR) 5 MG tablet Take by mouth.    . hydrocortisone 2.5 % cream APPLY TO AFFECTED AREA TWICE A DAY FOR 7 DAYS    . lisinopril (PRINIVIL,ZESTRIL) 30 MG tablet Take 40 mg by mouth daily.     Marland Kitchen lovastatin (MEVACOR) 20 MG tablet Take 20 mg by mouth at bedtime.    . tamsulosin (FLOMAX) 0.4 MG CAPS capsule Take 1 capsule (0.4 mg total) by mouth daily. 90 capsule 3  . traZODone (DESYREL) 50 MG tablet Take 50 mg by mouth at bedtime.    . vitamin B-12 (CYANOCOBALAMIN) 1000 MCG tablet Take 1,000 mcg by mouth daily.    . citalopram (CELEXA) 40 MG tablet Take by mouth.     No current facility-administered medications for this visit.    Past Medical History:  Diagnosis Date  . Anxiety   . BPH (benign prostatic hyperplasia)   . Carotid artery disease ([place])    s/p left carotid endarterectomy at [facility] in 2014  . Carotid stenosis    bilateral  . CKD (chronic kidney disease)   . Elevated PSA   . Fracture, thoracic vertebra ([place]) 05/06/2018   T3,T9 endplate fracture  . Hemothorax on left 05/06/2018  . HLD (hyperlipidemia)   . HTN (hypertension)    . Hyperlipidemia   . Hypertension   . Insomnia   . Rib fractures 05/06/2018  . Weak urinary stream     Past Surgical History:  Procedure Laterality Date  . APPENDECTOMY    . carotid stenosis Left    ces and stent placement   Social History        Tobacco Use  . Smoking status: Former Research scientist (life sciences)  . Smokeless tobacco: Former Systems developer  . Tobacco comment: quit 40 years ago  Substance Use Topics  . Alcohol use: Yes    Alcohol/week: 0.0 oz    Comment: occasional  . Drug use: No           Family History  Problem Relation Age of Onset  . CVA Father   . Diabetes Mellitus II Father   . Prostate cancer Neg Hx   . Kidney disease Neg Hx   . Kidney cancer Neg Hx   . Bladder Cancer Neg Hx      No Known Allergies   REVIEW OF SYSTEMS(Negative unless checked)  Constitutional: [] ??Weight loss[] ??Fever[] ??Chills Cardiac:[] ??Chest pain[] ??Chest pressure[] ??Palpitations [] ??Shortness of breath when laying flat [] ??Shortness of breath at rest [] ??Shortness of breath with exertion. Vascular: [] ??Pain in legs with walking[] ??Pain in legsat rest[] ??Pain in legs when laying  flat [] ??Claudication [] ??Pain in feet when walking [] ??Pain in feet at rest [] ??Pain in feet when laying flat [] ??History of DVT [] ??Phlebitis [] ??Swelling in legs [] ??Varicose veins [] ??Non-healing ulcers Pulmonary: [] ??Uses home oxygen [] ??Productive cough[] ??Hemoptysis [] ??Wheeze [] ??COPD [] ??Asthma Neurologic: [] ??Dizziness [] ??Blackouts [] ??Seizures [] ??History of stroke [] ??History of TIA[] ??Aphasia [] ??Temporary blindness[] ??Dysphagia [] ??Weaknessor numbness in arms [] ??Weakness or numbnessin legs Musculoskeletal: [x] ??Arthritis [] ??Joint swelling [] ??Joint pain [] ??Low back pain Hematologic:[] ??Easy bruising[] ??Easy bleeding [] ??Hypercoagulable state [] ??Anemic [] ??Hepatitis  Gastrointestinal:[] ??Blood in stool[] ??Vomiting blood[] ??Gastroesophageal reflux/heartburn[] ??Difficulty swallowing. Genitourinary: [] ??Chronic kidney disease [x] ??Difficulturination [] ??Frequenturination [] ??Burning with urination[] ??Blood in urine Skin: [] ??Rashes [] ??Ulcers [] ??Wounds Psychological: [] ??History of anxiety[] ??History of major depression.     Physical Examination  Vitals:   11/03/19 1103  BP: (!) 185/75  Pulse: 66  Resp: 17  Weight: 149 lb (67.6 kg)  Height: 5\' 7"  (1.702 m)   Body mass index is 23.34 kg/m. Gen:  WD/WN, NAD Head: [place]/AT, No temporalis wasting. Ear/Nose/Throat: Hearing grossly intact, nares w/o erythema or drainage, trachea midline Eyes: Conjunctiva clear. Sclera non-icteric Neck: Supple.  Soft right carotid bruit  Pulmonary:  Good air movement, equal and clear to auscultation bilaterally.  Cardiac: RRR, No JVD Vascular:  Vessel Right Left  Radial Palpable Palpable                   Musculoskeletal: M/S 5/5 throughout.  No deformity or atrophy. No edema. Neurologic: CN 2-12 intact. Sensation grossly intact in extremities.  Symmetrical.  Speech is fluent. Motor exam as listed above. Psychiatric: Judgment intact, Mood & affect appropriate for pt's clinical situation. Dermatologic: No rashes or ulcers noted.  No cellulitis or open wounds.      CBC Lab Results  Component Value Date   WBC 7.5 04/25/2018   HGB 10.5 (L) 04/25/2018   HCT 30.9 (L) 04/25/2018   MCV 94.8 04/25/2018   PLT 192 04/25/2018    BMET    Component Value Date/Time   NA 137 04/25/2018 0255   NA 139 08/11/2013 0406   K 4.0 04/25/2018 0255   K 4.2 08/11/2013 0406   CL 103 04/25/2018 0255   CL 107 08/11/2013 0406   CO2 27 04/25/2018 0255   CO2 29 08/11/2013 0406   GLUCOSE 133 (H) 04/25/2018 0255   GLUCOSE 101 (H) 08/11/2013 0406   BUN 28 (H) 04/25/2018 0255   BUN 18 08/11/2013 0406   CREATININE 1.29 (H) 04/25/2018 0255    CREATININE 1.43 (H) 08/11/2013 0406   CALCIUM 8.6 (L) 04/25/2018 0255   CALCIUM 8.7 08/11/2013 0406   GFRNONAA 52 (L) 04/25/2018 0255   GFRNONAA 49 (L) 08/11/2013 0406   GFRAA >60 04/25/2018 0255   GFRAA 56 (L) 08/11/2013 0406   CrCl cannot be calculated (Patient's most recent lab result is older than the maximum 21 days allowed.).  COAG Lab Results  Component Value Date   INR 1.06 04/16/2018   INR 1.1 08/11/2013    Radiology No results found.   Assessment/Plan Essential hypertension blood pressure control important in reducing the progression of atherosclerotic disease. On appropriate oral medications,but his control is suboptimal. We discussed potentially increasing the dose of his Norvasc from 5 mg to 10 mg daily. He is also going to contact his primary care physician about this. He is also already on lisinopril and HCTZ. If his blood pressure continues to worsen, I would consider a renal artery duplex.   Hyperlipidemia lipid control important in reducing the progression of atherosclerotic disease. Continue statin therapy  Bilateral carotid artery stenosis His duplex today shows a patent left  carotid stent without significant restenosis.  His right carotid artery stenosis is in the upper end of the 60 to 79% range and velocities have increased some on follow-up. This right carotid is clearly trending towards a high-grade range.  Although immediate intervention is not required, I would like to shorten his follow-up and see him in 3 months with a CT angiogram of the neck for further evaluation.  If this is indeed a greater than 75 to 80% stenosis, consideration for intervention or surgery at that time would be appropriate.    Leotis Pain, MD  11/03/2019 12:16 PM    This note was created with Dragon medical transcription system.  Any errors from dictation are purely unintentional

## 2019-11-03 NOTE — Assessment & Plan Note (Signed)
His duplex today shows a patent left carotid stent without significant restenosis.  His right carotid artery stenosis is in the upper end of the 60 to 79% range and velocities have increased some on follow-up. This right carotid is clearly trending towards a high-grade range.  Although immediate intervention is not required, I would like to shorten his follow-up and see him in 3 months with a CT angiogram of the neck for further evaluation.  If this is indeed a greater than 75 to 80% stenosis, consideration for intervention or surgery at that time would be appropriate.

## 2020-01-13 DIAGNOSIS — R69 Illness, unspecified: Secondary | ICD-10-CM | POA: Insufficient documentation

## 2020-01-13 DIAGNOSIS — F528 Other sexual dysfunction not due to a substance or known physiological condition: Secondary | ICD-10-CM | POA: Insufficient documentation

## 2020-01-13 DIAGNOSIS — Z9852 Vasectomy status: Secondary | ICD-10-CM | POA: Insufficient documentation

## 2020-01-13 DIAGNOSIS — D126 Benign neoplasm of colon, unspecified: Secondary | ICD-10-CM | POA: Insufficient documentation

## 2020-01-13 DIAGNOSIS — G4700 Insomnia, unspecified: Secondary | ICD-10-CM | POA: Insufficient documentation

## 2020-01-13 DIAGNOSIS — R972 Elevated prostate specific antigen [PSA]: Secondary | ICD-10-CM | POA: Insufficient documentation

## 2020-01-24 IMAGING — DX DG CHEST 1V PORT
1 series · 1 of 1 positions shown · non-contrast
Comparison: Portable film earlier in the day.

CLINICAL DATA: Chest tube placement, checking for hemothorax.

EXAM:
PORTABLE CHEST 1 VIEW

[chest ap]
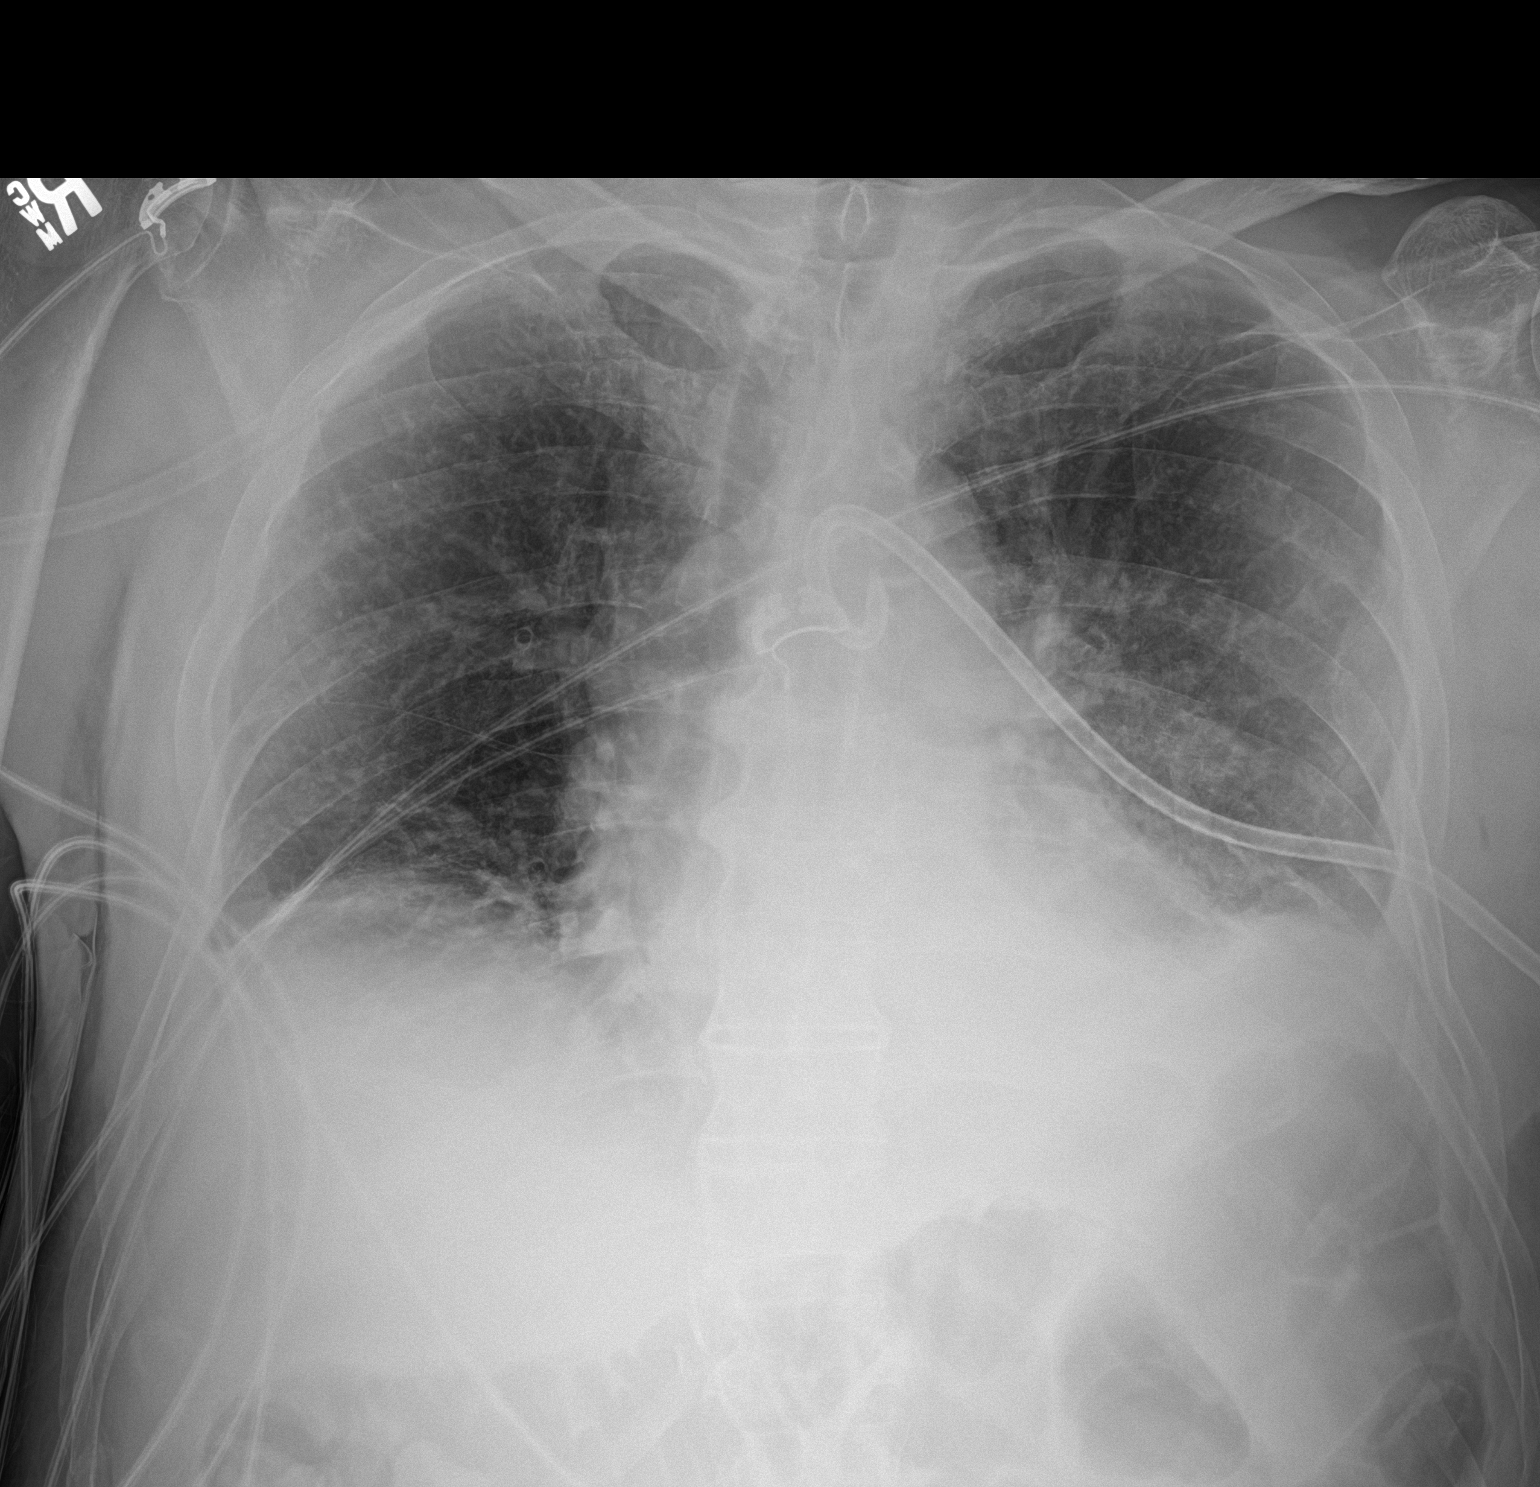

[1 of 1 positions shown; findings below may reference images not displayed]

FINDINGS: LEFT pigtail chest catheter has been placed and lies along the
medial hemithorax, at the aortic arch level. LEFT
effusion/hemothorax is reduced. There is no pneumothorax. Multiple
LEFT-sided rib fractures.
IMPRESSION: Decreased LEFT effusion/hemothorax.

## 2020-01-24 IMAGING — DX DG CHEST 1V PORT
1 series · 1 of 1 positions shown · non-contrast
Comparison: 04/17/2018.  CT 04/16/2018.

CLINICAL DATA: Chest trauma.

EXAM:
PORTABLE CHEST 1 VIEW

[chest]
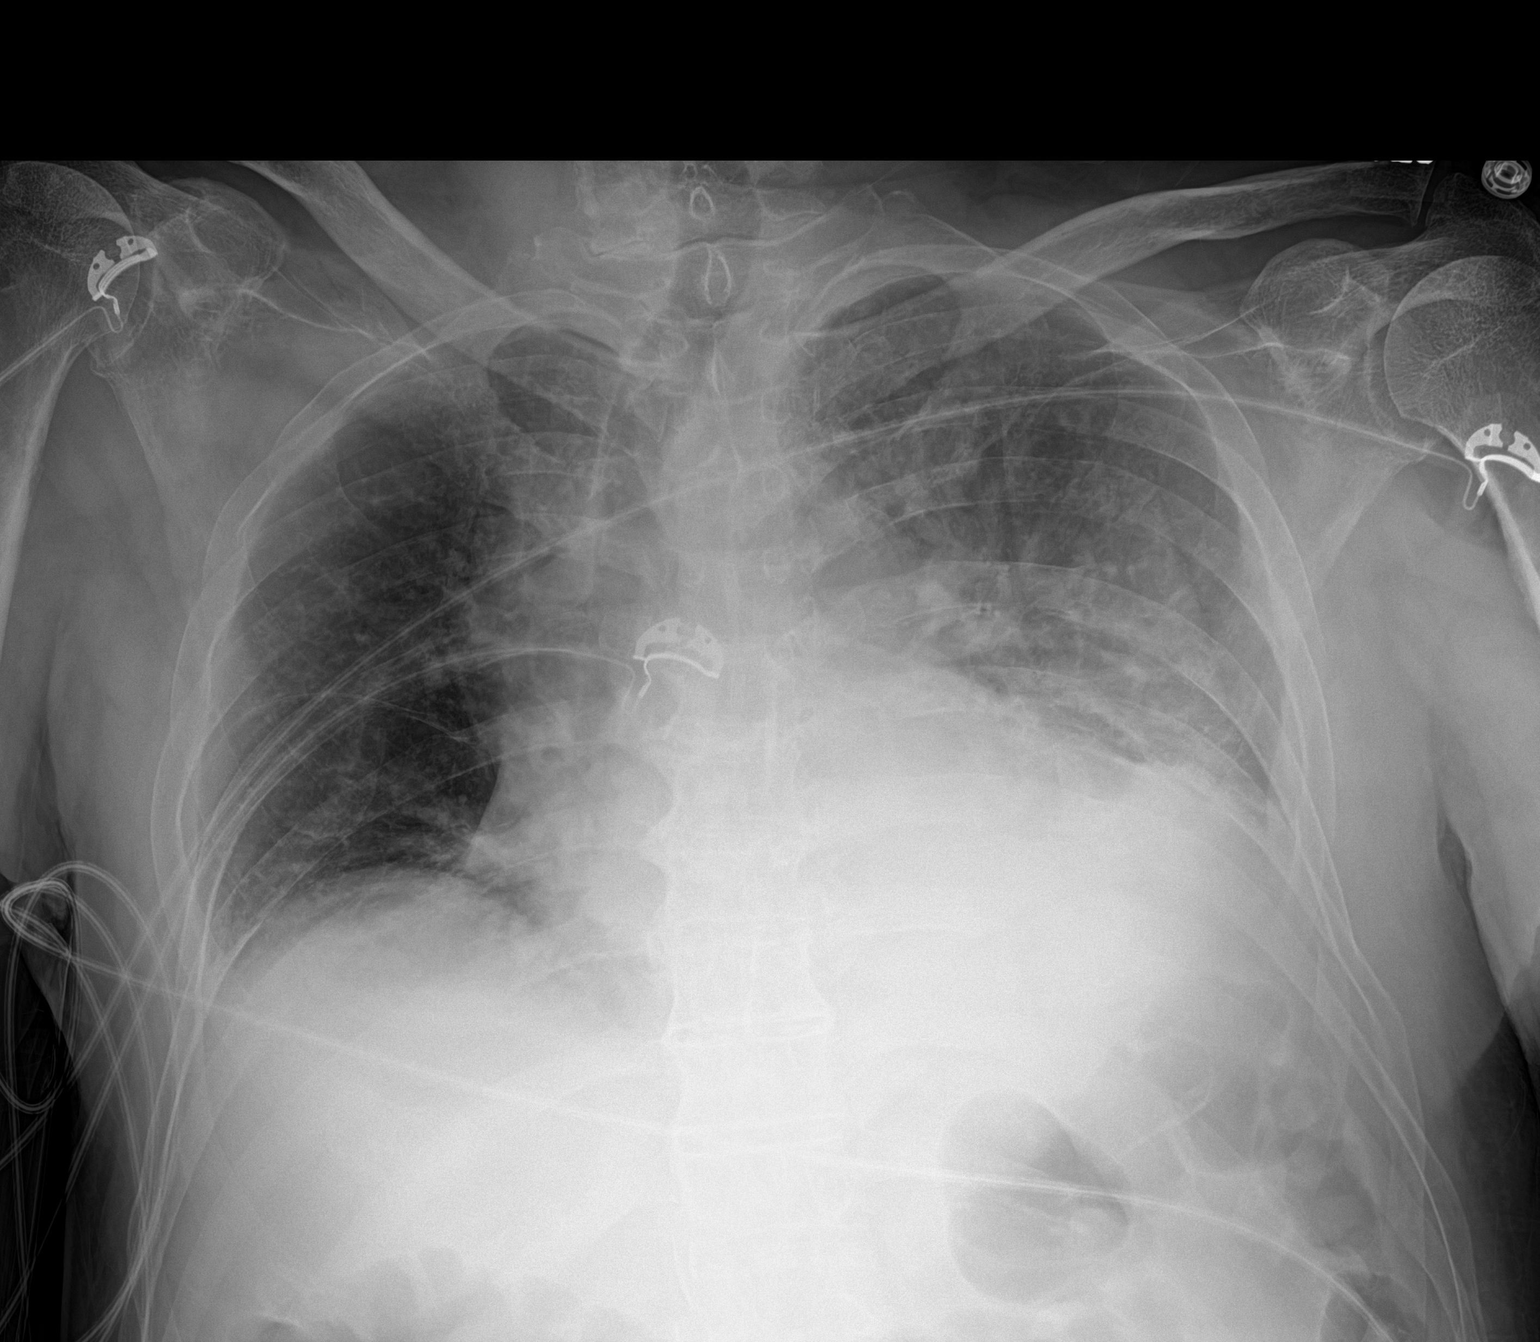

[1 of 1 positions shown; findings below may reference images not displayed]

FINDINGS: Mediastinum and heart size are stable. Vascular stent noted over the
left neck. Persistent left base atelectasis and
infiltrate/contusion. Small left pleural effusion again noted. No
pneumothorax P left-sided rib fractures again noted.
IMPRESSION: 1.  Left rib fractures are again noted.  No pneumothorax.

2. Persistent left base atelectasis and infiltrate/contusion.
Persistent small left pleural effusion.

## 2020-01-25 IMAGING — DX DG CHEST 1V PORT
1 series · 1 of 1 positions shown · non-contrast
Comparison: 04/18/2018

CLINICAL DATA: Hemothorax on left.  Chest tube placement

EXAM:
PORTABLE CHEST 1 VIEW

[chest ap]
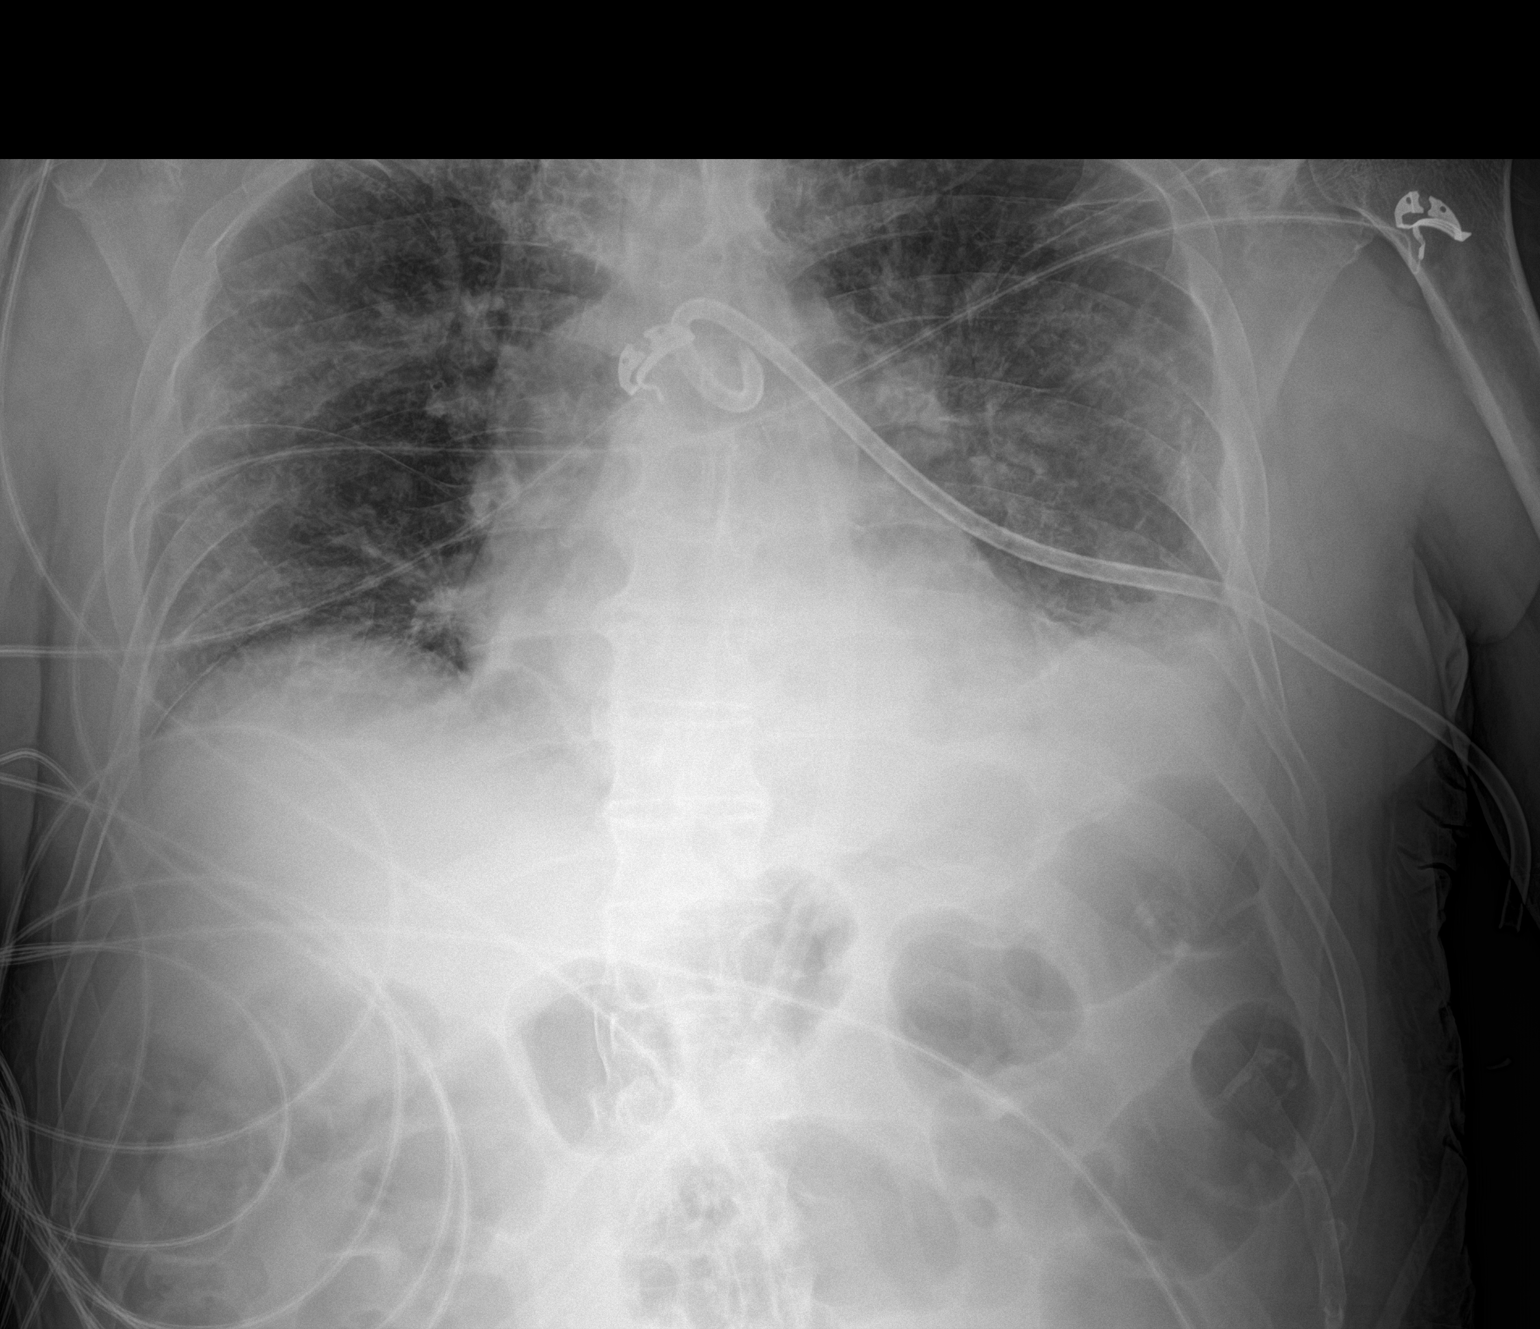

[1 of 1 positions shown; findings below may reference images not displayed]

FINDINGS: Pigtail catheter on the left is unchanged. Small left effusion. No
pneumothorax

Diffuse bilateral airspace disease is unchanged. Decreased lung
volume compared with the prior study. Bibasilar atelectasis
unchanged
IMPRESSION: Left chest tube in place.  Small left effusion.  No pneumothorax

Decreased lung volume compared to the prior study. Bilateral
airspace disease unchanged.

## 2020-01-27 IMAGING — DX DG CHEST 1V PORT
1 series · 1 of 1 positions shown · non-contrast
Comparison: 04/20/2018

CLINICAL DATA: Follow-up left hemithorax

EXAM:
PORTABLE CHEST 1 VIEW

[chest]
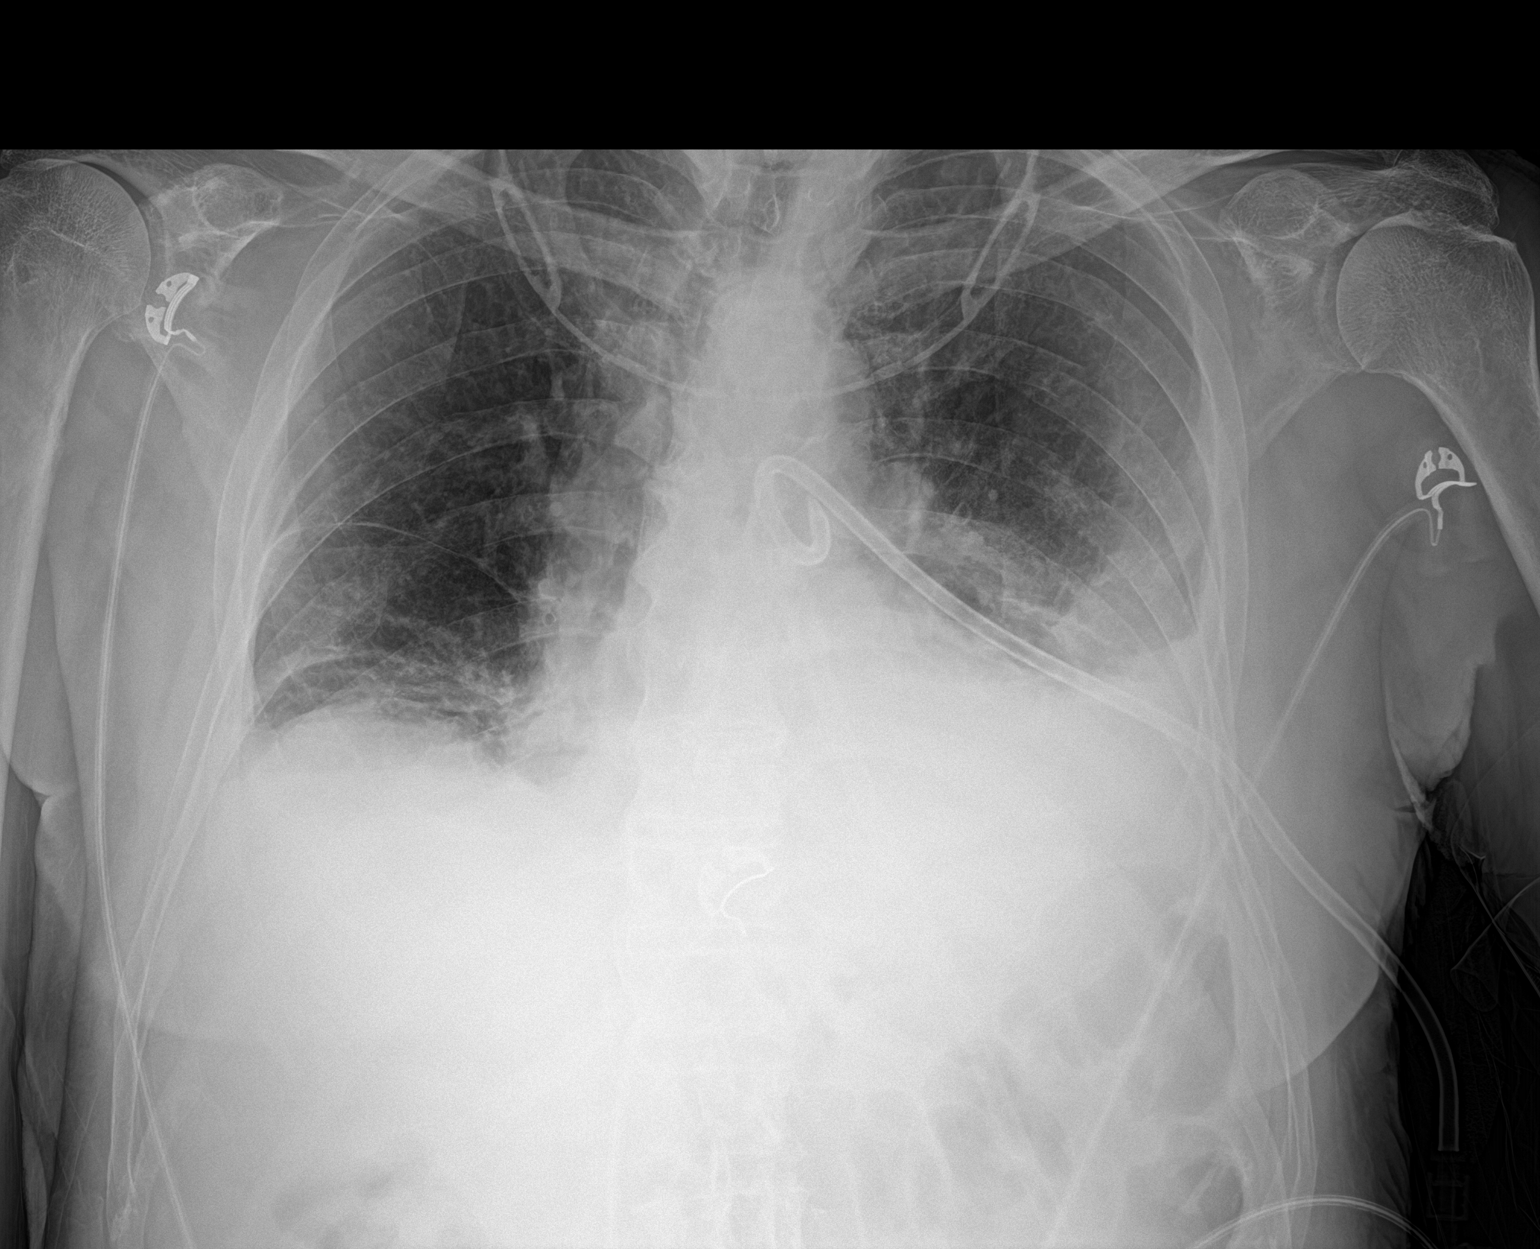

[1 of 1 positions shown; findings below may reference images not displayed]

FINDINGS: Left chest tube remains in place, unchanged. Moderate left pleural
effusion, increased since prior study. No pneumothorax. Bibasilar
opacities, likely atelectasis. Mild cardiomegaly and vascular
congestion. No acute bony abnormality.
IMPRESSION: Left chest tube remains in place.  No pneumothorax.

Increasing moderate left pleural effusion.

Bibasilar opacities have increased, likely atelectasis.

Cardiomegaly, vascular congestion.

## 2020-02-01 ENCOUNTER — Other Ambulatory Visit: Payer: Self-pay

## 2020-02-01 ENCOUNTER — Ambulatory Visit
Admission: RE | Admit: 2020-02-01 | Discharge: 2020-02-01 | Disposition: A | Discharge status: Home / Self Care | Payer: Medicare Other | Source: Ambulatory Visit | Attending: Vascular Surgery | Admitting: Vascular Surgery

## 2020-02-01 DIAGNOSIS — I6523 Occlusion and stenosis of bilateral carotid arteries: Secondary | ICD-10-CM | POA: Insufficient documentation

## 2020-02-01 MED ORDER — IOHEXOL 350 MG/ML SOLN
60.0000 mL | Freq: Once | INTRAVENOUS | Status: AC | PRN
  Administered 2020-02-01: 60 mL via INTRAVENOUS

## 2020-02-02 ENCOUNTER — Encounter (INDEPENDENT_AMBULATORY_CARE_PROVIDER_SITE_OTHER): Payer: Self-pay | Admitting: Vascular Surgery

## 2020-02-02 ENCOUNTER — Ambulatory Visit (INDEPENDENT_AMBULATORY_CARE_PROVIDER_SITE_OTHER): Payer: Medicare Other | Admitting: Vascular Surgery

## 2020-02-02 VITALS — BP 168/66 | HR 67 | Resp 8 | Ht 67.0 in | Wt 149.0 lb

## 2020-02-02 DIAGNOSIS — E785 Hyperlipidemia, unspecified: Secondary | ICD-10-CM

## 2020-02-02 DIAGNOSIS — I6523 Occlusion and stenosis of bilateral carotid arteries: Secondary | ICD-10-CM

## 2020-02-02 DIAGNOSIS — N183 Chronic kidney disease, stage 3 unspecified: Secondary | ICD-10-CM

## 2020-02-02 DIAGNOSIS — I10 Essential (primary) hypertension: Secondary | ICD-10-CM | POA: Diagnosis not present

## 2020-02-02 NOTE — Assessment & Plan Note (Signed)
lipid control important in reducing the progression of atherosclerotic disease. Continue statin therapy  

## 2020-02-02 NOTE — Assessment & Plan Note (Signed)
Try to follow with carotid duplex at this point and avoid contrast.

## 2020-02-02 NOTE — Patient Instructions (Signed)
Carotid Artery Disease  Carotid artery disease is the narrowing or blockage of one or both carotid arteries. This condition is also called carotid artery stenosis. The carotid arteries are the two main blood vessels on either side of the neck. They send blood to the brain, other parts of the head, and the neck.  This condition increases your risk for a stroke or a transient ischemic attack (TIA). A TIA is a "mini-stroke" that causes stroke-like symptoms that go away quickly. What are the causes? This condition is mainly caused by a narrowing and hardening of the carotid arteries. The carotid arteries can become narrow or clogged with a buildup of plaque. Plaque includes:  Fat.  Cholesterol.  Calcium.  Other substances. What increases the risk? The following factors may make you more likely to develop this condition:  Having certain medical conditions, such as: ? High cholesterol. ? High blood pressure. ? Diabetes. ? Obesity.  Smoking.  A family history of cardiovascular disease.  Not being active or lack of regular exercise.  Being male. Men have a higher risk of having arteries become narrow and harden earlier in life than women.  Old age. What are the signs or symptoms? This condition may not have any signs or symptoms until a stroke or TIA happens. In some cases, your doctor may be able to hear a whooshing sound. This can suggest a change in blood flow caused by plaque buildup. An eye exam can also help find signs of the condition. How is this treated? This condition may be treated with more than one treatment. Treatment options include:  Lifestyle changes, such as: ? Quitting smoking. ? Getting regular exercise, or getting exercise as told by your doctor. ? Eating a healthy diet. ? Managing stress. ? Keeping a healthy weight.  Medicines to control: ? Blood pressure. ? Cholesterol. ? Blood clotting.  Surgery. You may have: ? A surgery to remove the blockages in  the carotid arteries. ? A procedure in which a small mesh tube (stent) is used to widen the blocked carotid arteries. Follow these instructions at home: Eating and drinking Follow instructions about your diet from your doctor. It is important to follow a healthy diet.  Eat a diet that includes: ? A lot of fresh fruits and vegetables. ? Low-fat (lean) meats.  Avoid these foods: ? Foods that are high in fat. ? Foods that are high in salt (sodium). ? Foods that are fried. ? Foods that are processed. ? Foods that have few good nutrients (poor nutritional value).  Lifestyle   Keep a healthy weight.  Do exercises as told by your doctor to stay active. Each week, you should get one of the following: ? At least 150 minutes of exercise that raises your heart rate and makes you sweat (moderate-intensity exercise). ? At least 75 minutes of exercise that takes a lot of effort.  Do not use any products that contain nicotine or tobacco, such as cigarettes, e-cigarettes, and chewing tobacco. If you need help quitting, ask your doctor.  Do not drink alcohol if: ? Your doctor tells you not to drink. ? You are pregnant, may be pregnant, or are planning to become pregnant.  If you drink alcohol: ? Limit how much you use to:  0-1 drink a day for women.  0-2 drinks a day for men. ? Be aware of how much alcohol is in your drink. In the [place] one drink equals one 12 oz bottle of beer (355 mL), one 5   oz glass of wine (148 mL), or one 1 oz glass of hard liquor (44 mL).  Do not use drugs.  Manage your stress. Ask your doctor for tips on how to do this. General instructions  Take over-the-counter and prescription medicines only as told by your doctor.  Keep all follow-up visits as told by your doctor. This is important. Where to find more information  American Heart Association: www.heart.org Get help right away if:  You have any signs of a stroke. "BE FAST" is an easy way to remember the  main warning signs: ? B - Balance. Signs are dizziness, sudden trouble walking, or loss of balance. ? E - Eyes. Signs are trouble seeing or a change in how you see. ? F - Face. Signs are sudden weakness or loss of feeling of the face, or the face or eyelid drooping on one side. ? A - Arms. Signs are weakness or loss of feeling in an arm. This happens suddenly and usually on one side of the body. ? S - Speech. Signs are sudden trouble speaking, slurred speech, or trouble understanding what people say. ? T - Time. Time to call emergency services. Write down what time symptoms started.  You have other signs of a stroke, such as: ? A sudden, very bad headache with no known cause. ? Feeling like you may vomit (nausea). ? Vomiting. ? A seizure. These symptoms may be an emergency. Do not wait to see if the symptoms will go away. Get medical help right away. Call your local emergency services (911 in the [place] Do not drive yourself to the hospital. Summary  The carotid arteries are blood vessels on both sides of the neck.  If these arteries get smaller or get blocked, you are more likely to have a stroke or a mini-stroke.  This condition can be treated with lifestyle changes, medicines, surgery, or a blend of these treatments.  Get help right away if you have any signs of a stroke. "BE FAST" is an easy way to remember the main warning signs of stroke. This information is not intended to replace advice given to you by your health care provider. Make sure you discuss any questions you have with your health care provider. Document Revised: 05/18/2019 Document Reviewed: 05/18/2019 Elsevier Patient Education  2020 Elsevier Inc.  

## 2020-02-02 NOTE — Assessment & Plan Note (Addendum)
blood pressure control important in reducing the progression of atherosclerotic disease. On appropriate oral medications. No changes

## 2020-02-02 NOTE — Progress Notes (Signed)
MRN : [id number]  Xavier Davis is a 79 y.o. ([birth year]) male who presents with chief complaint of  Chief Complaint  Patient presents with  . Follow-up    51Mo no studies   .  History of Present Illness: Patient returns today in follow up of his carotid disease. He is doing well and has no symptoms today. Specifically, the patient denies amaurosis fugax, speech or swallowing difficulties, or arm or leg weakness or numbness. He has undergone a CT angiogram which I have independently reviewed. The official report for this is a widely patent left carotid stent with a 60% right ICA stenosis.  My interpretation of this was of a 70-75% stenosis of the right carotid artery more consistent with the carotid duplex findings, but still not a >80% right ICA stenosis in an asymptomatic patent.  Current Outpatient Medications  Medication Sig Dispense Refill  . amLODipine (NORVASC) 5 MG tablet Take by mouth.    Marland Kitchen aspirin 325 MG tablet Take 325 mg by mouth daily. Takes 1/2 a tablet    . citalopram (CELEXA) 40 MG tablet Take by mouth.    . ferrous sulfate 325 (65 FE) MG tablet Take 325 mg by mouth daily with breakfast.    . finasteride (PROSCAR) 5 MG tablet Take by mouth.    . hydrocortisone 2.5 % cream APPLY TO AFFECTED AREA TWICE A DAY FOR 7 DAYS    . lisinopril (PRINIVIL,ZESTRIL) 30 MG tablet Take 40 mg by mouth daily.     Marland Kitchen lovastatin (MEVACOR) 20 MG tablet Take 20 mg by mouth at bedtime.    . tamsulosin (FLOMAX) 0.4 MG CAPS capsule Take 1 capsule (0.4 mg total) by mouth daily. (Patient not taking: Reported on 02/02/2020) 90 capsule 3  . traZODone (DESYREL) 50 MG tablet Take 50 mg by mouth at bedtime.    . vitamin B-12 (CYANOCOBALAMIN) 1000 MCG tablet Take 1,000 mcg by mouth daily.     No current facility-administered medications for this visit.    Past Medical History:  Diagnosis Date  . Anxiety   . BPH (benign prostatic hyperplasia)   . Carotid artery disease ([place])    s/p left carotid  endarterectomy at [facility] in 2014  . Carotid stenosis    bilateral  . CKD (chronic kidney disease)   . Elevated PSA   . Fracture, thoracic vertebra ([place]) 05/06/2018   T3,T9 endplate fracture  . Hemothorax on left 05/06/2018  . HLD (hyperlipidemia)   . HTN (hypertension)   . Hyperlipidemia   . Hypertension   . Insomnia   . Rib fractures 05/06/2018  . Weak urinary stream     Past Surgical History:  Procedure Laterality Date  . APPENDECTOMY    . carotid stenosis Left    ces and stent placement     Social History   Tobacco Use  . Smoking status: Former Research scientist (life sciences)  . Smokeless tobacco: Never Used  . Tobacco comment: quit 40 years ago  Substance Use Topics  . Alcohol use: Yes    Alcohol/week: 4.0 standard drinks    Types: 4 Cans of beer per week    Comment: occasional 4 cans of beer/wkly  . Drug use: Never    Family History  Problem Relation Age of Onset  . Stroke Father   . CVA Father   . Diabetes Mellitus II Father   . Prostate cancer Neg Hx   . Kidney disease Neg Hx   . Kidney cancer Neg Hx   .  Bladder Cancer Neg Hx      No Known Allergies   REVIEW OF SYSTEMS (Negative unless checked)  Constitutional: [] Weight loss  [] Fever  [] Chills Cardiac: [] Chest pain   [] Chest pressure   [] Palpitations   [] Shortness of breath when laying flat   [] Shortness of breath at rest   [] Shortness of breath with exertion. Vascular:  [] Pain in legs with walking   [] Pain in legs at rest   [] Pain in legs when laying flat   [] Claudication   [] Pain in feet when walking  [] Pain in feet at rest  [] Pain in feet when laying flat   [] History of DVT   [] Phlebitis   [] Swelling in legs   [] Varicose veins   [] Non-healing ulcers Pulmonary:   [] Uses home oxygen   [] Productive cough   [] Hemoptysis   [] Wheeze  [] COPD   [] Asthma Neurologic:  [] Dizziness  [] Blackouts   [] Seizures   [] History of stroke   [] History of TIA  [] Aphasia   [] Temporary blindness   [] Dysphagia   [] Weakness or numbness in arms    [] Weakness or numbness in legs Musculoskeletal:  [x] Arthritis   [] Joint swelling   [x] Joint pain   [] Low back pain Hematologic:  [] Easy bruising  [] Easy bleeding   [] Hypercoagulable state   [] Anemic   Gastrointestinal:  [] Blood in stool   [] Vomiting blood  [] Gastroesophageal reflux/heartburn   [] Abdominal pain Genitourinary:  [x] Chronic kidney disease   [] Difficult urination  [x] Frequent urination  [] Burning with urination   [] Hematuria Skin:  [] Rashes   [] Ulcers   [] Wounds Psychological:  [] History of anxiety   []  History of major depression.  Physical Examination  BP (!) 168/66   Pulse 67   Resp (!) 8   Ht 5\' 7"  (1.702 m)   Wt 149 lb (67.6 kg)   BMI 23.34 kg/m  Gen:  WD/WN, NAD Head: [place] II/AT, No temporalis wasting. Ear/Nose/Throat: Hearing grossly intact, nares w/o erythema or drainage Eyes: Conjunctiva clear. Sclera non-icteric Neck: Supple.  Trachea midline Pulmonary:  Good air movement, no use of accessory muscles.  Cardiac: RRR, no JVD Vascular:  Vessel Right Left  Radial Palpable Palpable                   Musculoskeletal: M/S 5/5 throughout.  No deformity or atrophy. No edema. Neurologic: Sensation grossly intact in extremities.  Symmetrical.  Speech is fluent.  Psychiatric: Judgment intact, Mood & affect appropriate for pt's clinical situation. Dermatologic: No rashes or ulcers noted.  No cellulitis or open wounds.       Labs No results found for this or any previous visit (from the past 2160 hour(s)).  Radiology CT Angio Neck W/Cm &/Or Wo/Cm  Result Date: 02/01/2020 CLINICAL DATA:  Carotid stenosis. EXAM: CT ANGIOGRAPHY NECK TECHNIQUE: Multidetector CT imaging of the neck was performed using the standard protocol during bolus administration of intravenous contrast. Multiplanar CT image reconstructions and MIPs were obtained to evaluate the vascular anatomy. Carotid stenosis measurements (when applicable) are obtained utilizing NASCET criteria, using the distal  internal carotid diameter as the denominator. CONTRAST:  78mL OMNIPAQUE IOHEXOL 350 MG/ML SOLN COMPARISON:  CT a neck 07/21/2013 FINDINGS: Aortic arch: Atherosclerotic calcification of the aortic arch without aneurysm or dissection. Atherosclerotic calcification proximal great vessels without significant stenosis. Right carotid system: Atherosclerotic calcification in the right common carotid artery without stenosis. Bulky calcified plaque proximal right internal carotid artery. Minimal luminal diameter 1.7 mm corresponding to 60% diameter stenosis proximal right internal carotid artery. Additional calcific plaque extending above  the bifurcation has progressed in the interval. Additional 60% diameter stenosis of the right internal carotid artery due to calcific plaque. Left carotid system: Interval stenting of left carotid artery across the bifurcation. Stent in good position and widely patent. No significant stenosis. Vertebral arteries: Both vertebral arteries patent to the basilar without significant stenosis. Mild stenosis distal right vertebral artery due to calcific disease Skeleton: Degenerative changes in the cervical spine moderately severe compression fracture T3, appearing chronic and unchanged from CT chest 07/14/2018. Other neck: Negative for mass or adenopathy. Upper chest: Lung apices clear bilaterally. IMPRESSION: 1. Interval stenting of left carotid bifurcation. Stent is widely patent and in good position 2. Progressive atherosclerotic disease in the right internal carotid artery. Progressive stenosis at the origin of the right internal carotid artery and approximately 2 cm distal to the bifurcation. Each stenosis measures approximately 60% diameter stenosis. 3. Both vertebral arteries patent without significant stenosis. Electronically Signed   By: Franchot Gallo M.D.   On: 02/01/2020 11:28    Assessment/Plan  Hypertension blood pressure control important in reducing the progression of  atherosclerotic disease. On appropriate oral medications. No changes   CKD (chronic kidney disease) stage 3, GFR 30-59 ml/min Try to follow with carotid duplex at this point and avoid contrast.  Hyperlipidemia lipid control important in reducing the progression of atherosclerotic disease. Continue statin therapy   Carotid stenosis He has undergone a CT angiogram which I have independently reviewed. The official report for this is a widely patent left carotid stent with a 60% right ICA stenosis.  My interpretation of this was of a 70-75% stenosis of the right carotid artery more consistent with the carotid duplex findings, but still not a >80% right ICA stenosis in an asymptomatic patent. We had a long discussion today regarding treatment options.  After review of the CT scan, I do not think he needs to have surgery particularly in an asymptomatic patient nearing 79 years of age with less than 80% stenosis.  The stenosis correlates well with the carotid duplex and at this point I think we can follow with carotid duplex.  He will continue his aspirin and statin agent.  We discussed exercise and a good diet.  I will plan to see him back in about 3 months for a relatively short follow-up because this is at least nearing a high-grade stenosis.  If his carotid duplex progresses, we will have to discuss intervention at that point.    Leotis Pain, MD  02/02/2020 9:46 AM    This note was created with Dragon medical transcription system.  Any errors from dictation are purely unintentional

## 2020-02-02 NOTE — Assessment & Plan Note (Signed)
He has undergone a CT angiogram which I have independently reviewed. The official report for this is a widely patent left carotid stent with a 60% right ICA stenosis.  My interpretation of this was of a 70-75% stenosis of the right carotid artery more consistent with the carotid duplex findings, but still not a >80% right ICA stenosis in an asymptomatic patent. We had a long discussion today regarding treatment options.  After review of the CT scan, I do not think he needs to have surgery particularly in an asymptomatic patient nearing 78 years of age with less than 80% stenosis.  The stenosis correlates well with the carotid duplex and at this point I think we can follow with carotid duplex.  He will continue his aspirin and statin agent.  We discussed exercise and a good diet.  I will plan to see him back in about 3 months for a relatively short follow-up because this is at least nearing a high-grade stenosis.  If his carotid duplex progresses, we will have to discuss intervention at that point.

## 2020-05-10 ENCOUNTER — Ambulatory Visit (INDEPENDENT_AMBULATORY_CARE_PROVIDER_SITE_OTHER): Payer: Medicare Other | Admitting: Vascular Surgery

## 2020-05-10 ENCOUNTER — Encounter (INDEPENDENT_AMBULATORY_CARE_PROVIDER_SITE_OTHER): Payer: Medicare Other

## 2020-05-24 ENCOUNTER — Other Ambulatory Visit: Payer: Self-pay

## 2020-05-24 ENCOUNTER — Ambulatory Visit (INDEPENDENT_AMBULATORY_CARE_PROVIDER_SITE_OTHER): Payer: Medicare Other | Admitting: Vascular Surgery

## 2020-05-24 ENCOUNTER — Encounter (INDEPENDENT_AMBULATORY_CARE_PROVIDER_SITE_OTHER): Payer: Self-pay | Admitting: Vascular Surgery

## 2020-05-24 ENCOUNTER — Ambulatory Visit (INDEPENDENT_AMBULATORY_CARE_PROVIDER_SITE_OTHER): Payer: Medicare Other

## 2020-05-24 VITALS — BP 184/64 | HR 60 | Resp 14 | Ht 67.0 in | Wt 153.0 lb

## 2020-05-24 DIAGNOSIS — N183 Chronic kidney disease, stage 3 unspecified: Secondary | ICD-10-CM | POA: Diagnosis not present

## 2020-05-24 DIAGNOSIS — I6523 Occlusion and stenosis of bilateral carotid arteries: Secondary | ICD-10-CM | POA: Diagnosis not present

## 2020-05-24 DIAGNOSIS — I10 Essential (primary) hypertension: Secondary | ICD-10-CM | POA: Diagnosis not present

## 2020-05-24 DIAGNOSIS — E785 Hyperlipidemia, unspecified: Secondary | ICD-10-CM | POA: Diagnosis not present

## 2020-05-24 NOTE — Progress Notes (Signed)
MRN : [id number]  Xavier Davis is a 79 y.o. (30-May-1941) male who presents with chief complaint of  Chief Complaint  Patient presents with  . Follow-up    ultrasound  .  History of Present Illness: Patient returns in follow-up of his carotid disease.  He is almost 7 years status post left carotid artery stent placement for high-grade recurrent stenosis for a carotid endarterectomy done at a different institution earlier that year.  He has done well since then.  We have continue to monitor that regularly as well as follow his carotid stenosis on the right.  He denies any focal neurologic symptoms. Specifically, the patient denies amaurosis fugax, speech or swallowing difficulties, or arm or leg weakness or numbness.  His carotid duplex today shows better velocities in the right carotid system now following in the 40 to 59% range.  At last check, they were in the 60 to 79% range.  His left carotid stent remains patent.  Current Outpatient Medications  Medication Sig Dispense Refill  . amLODipine (NORVASC) 5 MG tablet Take by mouth.    Marland Kitchen aspirin 325 MG tablet Take 325 mg by mouth daily. Takes 1/2 a tablet    . citalopram (CELEXA) 40 MG tablet Take by mouth.    . finasteride (PROSCAR) 5 MG tablet Take by mouth.    Marland Kitchen lisinopril (PRINIVIL,ZESTRIL) 30 MG tablet Take 40 mg by mouth daily.     Marland Kitchen lovastatin (MEVACOR) 20 MG tablet Take 20 mg by mouth at bedtime.    . tamsulosin (FLOMAX) 0.4 MG CAPS capsule Take 1 capsule (0.4 mg total) by mouth daily. 90 capsule 3  . traZODone (DESYREL) 50 MG tablet Take 50 mg by mouth at bedtime.    . vitamin B-12 (CYANOCOBALAMIN) 1000 MCG tablet Take 1,000 mcg by mouth daily.    . ferrous sulfate 325 (65 FE) MG tablet Take 325 mg by mouth daily with breakfast. (Patient not taking: Reported on 05/24/2020)    . hydrocortisone 2.5 % cream APPLY TO AFFECTED AREA TWICE A DAY FOR 7 DAYS (Patient not taking: Reported on 05/24/2020)     No current facility-administered  medications for this visit.    Past Medical History:  Diagnosis Date  . Anxiety   . BPH (benign prostatic hyperplasia)   . Carotid artery disease ([place])    s/p left carotid endarterectomy at [facility] in 2014  . Carotid stenosis    bilateral  . CKD (chronic kidney disease)   . Elevated PSA   . Fracture, thoracic vertebra ([place]) 05/06/2018   T3,T9 endplate fracture  . Hemothorax on left 05/06/2018  . HLD (hyperlipidemia)   . HTN (hypertension)   . Hyperlipidemia   . Hypertension   . Insomnia   . Rib fractures 05/06/2018  . Weak urinary stream     Past Surgical History:  Procedure Laterality Date  . APPENDECTOMY    . carotid stenosis Left    ces and stent placement     Social History   Tobacco Use  . Smoking status: Former Research scientist (life sciences)  . Smokeless tobacco: Never Used  . Tobacco comment: quit 40 years ago  Substance Use Topics  . Alcohol use: Yes    Alcohol/week: 4.0 standard drinks    Types: 4 Cans of beer per week    Comment: occasional 4 cans of beer/wkly  . Drug use: Never       Family History  Problem Relation Age of Onset  . Stroke Father   .  CVA Father   . Diabetes Mellitus II Father   . Prostate cancer Neg Hx   . Kidney disease Neg Hx   . Kidney cancer Neg Hx   . Bladder Cancer Neg Hx      No Known Allergies   REVIEW OF SYSTEMS (Negative unless checked)  Constitutional: [] Weight loss  [] Fever  [] Chills Cardiac: [] Chest pain   [] Chest pressure   [] Palpitations   [] Shortness of breath when laying flat   [] Shortness of breath at rest   [] Shortness of breath with exertion. Vascular:  [] Pain in legs with walking   [] Pain in legs at rest   [] Pain in legs when laying flat   [] Claudication   [] Pain in feet when walking  [] Pain in feet at rest  [] Pain in feet when laying flat   [] History of DVT   [] Phlebitis   [] Swelling in legs   [] Varicose veins   [] Non-healing ulcers Pulmonary:   [] Uses home oxygen   [] Productive cough   [] Hemoptysis   [] Wheeze  [] COPD    [] Asthma Neurologic:  [] Dizziness  [] Blackouts   [] Seizures   [] History of stroke   [] History of TIA  [] Aphasia   [] Temporary blindness   [] Dysphagia   [] Weakness or numbness in arms   [] Weakness or numbness in legs Musculoskeletal:  [x] Arthritis   [] Joint swelling   [x] Joint pain   [] Low back pain Hematologic:  [] Easy bruising  [] Easy bleeding   [] Hypercoagulable state   [] Anemic  [] Hepatitis Gastrointestinal:  [] Blood in stool   [] Vomiting blood  [] Gastroesophageal reflux/heartburn   [] Difficulty swallowing. Genitourinary:  [x] Chronic kidney disease   [] Difficult urination  [x] Frequent urination  [] Burning with urination   [] Blood in urine Skin:  [] Rashes   [] Ulcers   [] Wounds Psychological:  [] History of anxiety   []  History of major depression.  Physical Examination  Vitals:   05/24/20 1133 05/24/20 1134  BP: (!) 185/71 (!) 184/64  Pulse: 60   Resp: 14   Weight: 153 lb (69.4 kg)   Height: 5\' 7"  (1.702 m)    Body mass index is 23.96 kg/m. Gen:  WD/WN, NAD. Appears younger than stated age. Head: [place]/AT, No temporalis wasting. Ear/Nose/Throat: Hearing grossly intact, nares w/o erythema or drainage, trachea midline Eyes: Conjunctiva clear. Sclera non-icteric Neck: Supple.  Soft right carotid bruit  Pulmonary:  Good air movement, equal and clear to auscultation bilaterally.  Cardiac: RRR, No JVD Vascular:  Vessel Right Left  Radial Palpable Palpable           Musculoskeletal: M/S 5/5 throughout.  No deformity or atrophy. No edema. Neurologic: CN 2-12 intact. Sensation grossly intact in extremities.  Symmetrical.  Speech is fluent. Motor exam as listed above. Psychiatric: Judgment intact, Mood & affect appropriate for pt's clinical situation. Dermatologic: No rashes or ulcers noted.  No cellulitis or open wounds.      CBC Lab Results  Component Value Date   WBC 7.5 04/25/2018   HGB 10.5 (L) 04/25/2018   HCT 30.9 (L) 04/25/2018   MCV 94.8 04/25/2018   PLT 192 04/25/2018     BMET    Component Value Date/Time   NA 137 04/25/2018 0255   NA 139 08/11/2013 0406   K 4.0 04/25/2018 0255   K 4.2 08/11/2013 0406   CL 103 04/25/2018 0255   CL 107 08/11/2013 0406   CO2 27 04/25/2018 0255   CO2 29 08/11/2013 0406   GLUCOSE 133 (H) 04/25/2018 0255   GLUCOSE 101 (H) 08/11/2013 0406  BUN 28 (H) 04/25/2018 0255   BUN 18 08/11/2013 0406   CREATININE 1.29 (H) 04/25/2018 0255   CREATININE 1.43 (H) 08/11/2013 0406   CALCIUM 8.6 (L) 04/25/2018 0255   CALCIUM 8.7 08/11/2013 0406   GFRNONAA 52 (L) 04/25/2018 0255   GFRNONAA 49 (L) 08/11/2013 0406   GFRAA >60 04/25/2018 0255   GFRAA 56 (L) 08/11/2013 0406   CrCl cannot be calculated (Patient's most recent lab result is older than the maximum 21 days allowed.).  COAG Lab Results  Component Value Date   INR 1.06 04/16/2018   INR 1.1 08/11/2013    Radiology No results found.   Assessment/Plan Hypertension blood pressure control important in reducing the progression of atherosclerotic disease. On appropriate oral medications. No changes   CKD (chronic kidney disease) stage 3, GFR 30-59 ml/min Try to follow with carotid duplex at this point and avoid contrast.  Hyperlipidemia lipid control important in reducing the progression of atherosclerotic disease. Continue statin therapy  Carotid stenosis His carotid duplex today shows better velocities in the right carotid system now following in the 40 to 59% range.  At last check, they were in the 60 to 79% range.  His left carotid stent remains patent.  This is encouraging.  At this time, we will continue his current medical regimen which includes aspirin and lovastatin.  Recheck in 6 months with duplex.    Leotis Pain, MD  05/24/2020 12:00 PM    This note was created with Dragon medical transcription system.  Any errors from dictation are purely unintentional

## 2020-05-24 NOTE — Assessment & Plan Note (Signed)
His carotid duplex today shows better velocities in the right carotid system now following in the 40 to 59% range.  At last check, they were in the 60 to 79% range.  His left carotid stent remains patent.  This is encouraging.  At this time, we will continue his current medical regimen which includes aspirin and lovastatin.  Recheck in 6 months with duplex.

## 2020-07-27 NOTE — Progress Notes (Signed)
8:[place] May 15, 1941 [id number]  Referring provider: Tracie Harrier, [place] [facility] [place],  [place] [address]  Chief Complaint  Patient presents with  . Benign Prostatic Hypertrophy    HPI: Mr. Harb is a 79 year old male with BPH with LUTS who presents today for his 12 months follow up.    BPH WITH LUTS: His IPSS score today is 7, which is mild lower urinary tract symptomatology.  He is pleased with his quality life due to his urinary symptoms.  His previous IPSS score was 8/1.   His previous PVR is 0 mL.    His main complaints are leakage of urine.  He has urge incontinence when he waits too long after the desire to void is present.  Patient denies any modifying or aggravating factors.  Patient denies any gross hematuria, dysuria or suprapubic/flank pain.  Patient denies any fevers, chills, nausea or vomiting.   He had a negative biopsy in 2014 for PSA of 8.30   He is taking finasteride 5 mg and tamsulosin 0.4 mg daily.     IPSS    Row Name 07/28/20 0800         International Prostate Symptom Score   How often have you had the sensation of not emptying your bladder? Less than 1 in 5     How often have you had to urinate less than every two hours? Less than half the time     How often have you found you stopped and started again several times when you urinated? Less than half the time     How often have you found it difficult to postpone urination? Less than 1 in 5 times     How often have you had a weak urinary stream? Not at All     How often have you had to strain to start urination? Not at All     How many times did you typically get up at night to urinate? 1 Time     Total IPSS Score 7       Quality of Life due to urinary symptoms   If you were to spend the rest of your life with your urinary condition just the way it is now how would you feel about that? Pleased            Score:  1-7 Mild 8-19  Moderate 20-35 Severe  ED/Ejaculatory disorder SHIM: 6 He states he is losing his erection during intercourse and is unable to ejaculate.  He take Viagra for the erections.  He states the Viagra is losing its potency.  He does not have penile curvature or pain with erections.     SHIM    Row Name 07/28/20 0834         SHIM: Over the last 6 months:   How do you rate your confidence that you could get and keep an erection? Very Low     When you had erections with sexual stimulation, how often were your erections hard enough for penetration (entering your partner)? Almost Never or Never     During sexual intercourse, how often were you able to maintain your erection after you had penetrated (entered) your partner? Almost Never or Never     During sexual intercourse, how difficult was it to maintain your erection to completion of intercourse? Very Difficult     When you attempted sexual intercourse, how often was it satisfactory  for you? Almost Never or Never       SHIM Total Score   SHIM 6            Score: 1-7 Severe ED 8-11 Moderate ED 12-16 Mild-Moderate ED 17-21 Mild ED 22-25 No ED  PMH: Past Medical History:  Diagnosis Date  . Anxiety   . BPH (benign prostatic hyperplasia)   . Carotid artery disease ([place])    s/p left carotid endarterectomy at [facility] in 2014  . Carotid stenosis    bilateral  . CKD (chronic kidney disease)   . Elevated PSA   . Fracture, thoracic vertebra ([place]) 05/06/2018   T3,T9 endplate fracture  . Hemothorax on left 05/06/2018  . HLD (hyperlipidemia)   . HTN (hypertension)   . Hyperlipidemia   . Hypertension   . Insomnia   . Rib fractures 05/06/2018  . Weak urinary stream     Surgical History: Past Surgical History:  Procedure Laterality Date  . APPENDECTOMY    . carotid stenosis Left    ces and stent placement    Home Medications:  Allergies as of 07/28/2020   No Known Allergies     Medication List       Accurate as of July 28, 2020  8:57 AM. If you have any questions, ask your nurse or doctor.        amLODipine 2.5 MG tablet Commonly known as: NORVASC Take 2.5 mg by mouth daily. What changed: Another medication with the same name was removed. Continue taking this medication, and follow the directions you see here. Changed by: Zara Council, PA-C   aspirin 325 MG tablet Take 325 mg by mouth daily. Takes 1/2 a tablet   citalopram 40 MG tablet Commonly known as: CELEXA Take by mouth.   ferrous sulfate 325 (65 FE) MG tablet Take 325 mg by mouth daily with breakfast.   finasteride 5 MG tablet Commonly known as: PROSCAR Take 1 tablet (5 mg total) by mouth daily. What changed:   how much to take  when to take this Changed by: Geryl Dohn, PA-C   hydrocortisone 2.5 % cream APPLY TO AFFECTED AREA TWICE A DAY FOR 7 DAYS   lisinopril 30 MG tablet Commonly known as: ZESTRIL Take 40 mg by mouth daily.   lisinopril 40 MG tablet Commonly known as: ZESTRIL Take 40 mg by mouth daily.   lovastatin 20 MG tablet Commonly known as: MEVACOR Take 20 mg by mouth at bedtime.   tadalafil 20 MG tablet Commonly known as: CIALIS Take 1 tablet (20 mg total) by mouth daily as needed for erectile dysfunction. Started by: Zara Council, PA-C   tamsulosin 0.4 MG Caps capsule Commonly known as: FLOMAX Take 1 capsule (0.4 mg total) by mouth daily.   traZODone 50 MG tablet Commonly known as: DESYREL Take 50 mg by mouth at bedtime.   vitamin B-12 1000 MCG tablet Commonly known as: CYANOCOBALAMIN Take 1,000 mcg by mouth daily.       Allergies: No Known Allergies  Family History: Family History  Problem Relation Age of Onset  . Stroke Father   . CVA Father   . Diabetes Mellitus II Father   . Prostate cancer Neg Hx   . Kidney disease Neg Hx   . Kidney cancer Neg Hx   . Bladder Cancer Neg Hx     Social History:  reports that he has quit smoking. He has never used smokeless tobacco. He  reports current alcohol use of about 4.0  standard drinks of alcohol per week. He reports that he does not use drugs.  ROS: For pertinent review of systems please refer to history of present illness  Physical Exam: BP (!) 156/62   Pulse 70   Ht _0  (1.702 m)   Wt 150 lb (68 kg)   BMI 23.49 kg/m   Constitutional:  Well nourished. Alert and oriented, No acute distress. HEENT: [place] AT, mask in place, trachea midline Cardiovascular: No clubbing, cyanosis, or edema. Respiratory: Normal respiratory effort, no increased work of breathing. GI: Umbilical hernia noted  GU: No CVA tenderness.  No bladder fullness or masses.  Patient with circumcised phallus.  Urethral meatus is patent.  No penile discharge. No penile lesions or rashes. Scrotum without lesions, cysts, rashes and/or edema.  Testicles are located scrotally bilaterally. No masses are appreciated in the testicles. Left and right epididymis are normal. Rectal: Patient with  normal sphincter tone. Anus and perineum without scarring or rashes. No rectal masses are appreciated. Prostate is approximately 50+ grams, flat, could only palpate the apex and midportion of the gland, no nodules are appreciated. Seminal vesicles could not be palpated Skin: No rashes, bruises or suspicious lesions. Lymph: No inguinal adenopathy. Neurologic: Grossly intact, no focal deficits, moving all 4 extremities. Psychiatric: Normal mood and affect.   Laboratory Data:  Specimen:  Urine  Ref Range & Units 1 mo ago  Color Yellow  Yellow      Clarity Clear  Clear      Specific Gravity 1.000 - 1.030  1.020      pH, Urine 5.0 - 8.0  6.0      Protein, Urinalysis Negative, Trace mg/dL Negative      Glucose, Urinalysis Negative mg/dL Negative      Ketones, Urinalysis Negative mg/dL Negative      Blood, Urinalysis Negative  TraceAbnormal      Nitrite, Urinalysis Negative  Negative      Leukocyte Esterase, Urinalysis Negative  Negative      White Blood  Cells, Urinalysis None Seen, 0-3 /hpf None Seen      Red Blood Cells, Urinalysis None Seen, 0-3 /hpf 0-3      Bacteria, Urinalysis None Seen /hpf None Seen      Squamous Epithelial Cells, Urinalysis Rare, Few, None Seen /hpf Rare      Resulting Agency  [facility]  Specimen Collected: 06/27/20 7:21 AM Last Resulted: 06/27/20 8:10 AM  Received From: [facility]  Result Received: 07/27/20 8:24 AM   Related to PSA, Total (Screen) Component 06/27/20 12/13/15  PSA (Prostate Specific Antigen), Total 0.74 2.53   Specimen:  Blood  Ref Range & Units 1 mo ago  Glucose 70 - 110 mg/dL 138High      Sodium 136 - 145 mmol/L 139      Potassium 3.6 - 5.1 mmol/L 5.4High      Chloride 97 - 109 mmol/L 103      Carbon Dioxide (CO2) 22.0 - 32.0 mmol/L 31.0      Urea Nitrogen (BUN) 7 - 25 mg/dL 32High      Creatinine 0.7 - 1.3 mg/dL 1.8High      Glomerular Filtration Rate (eGFR), MDRD Estimate >60 mL/min/1.73sq m 37Low      Calcium 8.7 - 10.3 mg/dL 9.9      AST  8 - 39 U/L 15      ALT  6 - 57 U/L 17      Alk Phos (alkaline Phosphatase)  34 - 104 U/L 52      Albumin 3.5 - 4.8 g/dL 4.6      Bilirubin, Total 0.3 - 1.2 mg/dL 0.8      Protein, Total 6.1 - 7.9 g/dL 6.4      A/G Ratio 1.0 - 5.0 gm/dL 2.6      Resulting Agency  [facility] - [facility]  Specimen Collected: 06/27/20 7:21 AM Last Resulted: 06/27/20 10:32 AM  Received From: [facility]  Result Received: 07/27/20 8:24 AM   Specimen:  Blood  Ref Range & Units 1 mo ago  Hemoglobin A1C 4.2 - 5.6 % 6.5High      Average Blood Glucose (Calc) mg/dL 140      Resulting [place]  Narrative Performed by [facility] - [facility] Normal Range:  4.2 - 5.6%  Increased Risk: 5.7 - 6.4%  Diabetes:    >= 6.5%  Glycemic Control for adults with diabetes: <7%  Specimen Collected: 06/27/20 7:21 AM Last Resulted: 06/27/20 10:43 AM   Received From: [facility]  Result Received: 07/27/20 8:24 AM   I have reviewed the labs.  Assessment & Plan:    1. BPH with LUTS IPSS score is 7/1, it is improving  Continue conservative management, avoiding bladder irritants and timed voiding's Most bothersome symptom is urge incontinence, but it is only scant amounts and not bothersome to him Continue finasteride 5 mg daily and tamsulosin 0.4 mg - refills given  RTC in 12 months for IPSS and exam   2. ED SHIM score 6 He is no longer finding the Viagra effective and he would like to try tadalafil 20 mg on demand dosing I printed a prescription for the tadalafil 20 mg on demand dosing as well as a coupon Return to clinic in 12 months for shim and exam  3. Ejaculatory disorder  Patient is currently not experiencing erections that last long enough for intercourse, therefore it is unknown if his ejaculatory disorder still persists We will reassess at his next appointment in 12 months  Return in about 1 year (around 07/28/2021) for IPSS, SHIM and exam.  Zara Council, [facility]  [place] [address] [place], [place] [address] [phone]

## 2020-07-28 ENCOUNTER — Ambulatory Visit (INDEPENDENT_AMBULATORY_CARE_PROVIDER_SITE_OTHER): Payer: Medicare Other | Admitting: Urology

## 2020-07-28 ENCOUNTER — Encounter: Payer: Self-pay | Admitting: Urology

## 2020-07-28 ENCOUNTER — Other Ambulatory Visit: Payer: Self-pay

## 2020-07-28 VITALS — BP 156/62 | HR 70 | Ht 67.0 in | Wt 150.0 lb

## 2020-07-28 DIAGNOSIS — N529 Male erectile dysfunction, unspecified: Secondary | ICD-10-CM

## 2020-07-28 DIAGNOSIS — N401 Enlarged prostate with lower urinary tract symptoms: Secondary | ICD-10-CM

## 2020-07-28 DIAGNOSIS — I6523 Occlusion and stenosis of bilateral carotid arteries: Secondary | ICD-10-CM

## 2020-07-28 DIAGNOSIS — N5314 Retrograde ejaculation: Secondary | ICD-10-CM

## 2020-07-28 DIAGNOSIS — N138 Other obstructive and reflux uropathy: Secondary | ICD-10-CM

## 2020-07-28 MED ORDER — TAMSULOSIN HCL 0.4 MG PO CAPS: 0.4000 mg | ORAL_CAPSULE | Freq: Every day | ORAL | 3 refills | Status: DC

## 2020-07-28 MED ORDER — FINASTERIDE 5 MG PO TABS: 5.0000 mg | ORAL_TABLET | Freq: Every day | ORAL | 3 refills | Status: DC

## 2020-07-28 MED ORDER — TADALAFIL 20 MG PO TABS: 20.0000 mg | ORAL_TABLET | Freq: Every day | ORAL | 0 refills | Status: DC | PRN

## 2020-11-29 ENCOUNTER — Encounter (INDEPENDENT_AMBULATORY_CARE_PROVIDER_SITE_OTHER): Payer: Self-pay | Admitting: Vascular Surgery

## 2020-11-29 ENCOUNTER — Other Ambulatory Visit: Payer: Self-pay

## 2020-11-29 ENCOUNTER — Ambulatory Visit (INDEPENDENT_AMBULATORY_CARE_PROVIDER_SITE_OTHER): Payer: Medicare Other | Admitting: Vascular Surgery

## 2020-11-29 ENCOUNTER — Ambulatory Visit (INDEPENDENT_AMBULATORY_CARE_PROVIDER_SITE_OTHER): Payer: Medicare Other

## 2020-11-29 VITALS — BP 174/68 | HR 66 | Resp 16 | Wt 151.7 lb

## 2020-11-29 DIAGNOSIS — N183 Chronic kidney disease, stage 3 unspecified: Secondary | ICD-10-CM

## 2020-11-29 DIAGNOSIS — E785 Hyperlipidemia, unspecified: Secondary | ICD-10-CM

## 2020-11-29 DIAGNOSIS — I6523 Occlusion and stenosis of bilateral carotid arteries: Secondary | ICD-10-CM

## 2020-11-29 DIAGNOSIS — I10 Essential (primary) hypertension: Secondary | ICD-10-CM

## 2020-11-29 NOTE — Assessment & Plan Note (Signed)
Carotid duplex today reveals a patent left carotid artery stent and stenosis in the upper end of the 40 to 59% range on the right which is unchanged from previous studies.  Continue current medical regimen including aspirin and statin agent.  No role for intervention at this time.  Recheck in 6 months.

## 2020-11-29 NOTE — Progress Notes (Signed)
MRN : [id number]  Xavier Davis is a 80 y.o. (02/23/1941) male who presents with chief complaint of  Chief Complaint  Patient presents with  . Carotid    6 month ultrasound follow up  .  History of Present Illness: Patient returns in follow-up of his carotid disease.  He is doing well.  He denies any focal neurologic symptoms. Specifically, the patient denies amaurosis fugax, speech or swallowing difficulties, or arm or leg weakness or numbness.  He is status post left carotid stent placement 2014 after recurrent stenosis from a carotid endarterectomy done elsewhere. Carotid duplex today reveals a patent left carotid artery stent and stenosis in the upper end of the 40 to 59% range on the right which is unchanged from previous studies.  Current Outpatient Medications  Medication Sig Dispense Refill  . amLODipine (NORVASC) 2.5 MG tablet Take 2.5 mg by mouth daily.    Marland Kitchen aspirin 325 MG tablet Take 325 mg by mouth daily. Takes 1/2 a tablet    . citalopram (CELEXA) 40 MG tablet Take by mouth.    . ferrous sulfate 325 (65 FE) MG tablet Take 325 mg by mouth daily with breakfast.     . finasteride (PROSCAR) 5 MG tablet Take 1 tablet (5 mg total) by mouth daily. 90 tablet 3  . hydrocortisone 2.5 % cream APPLY TO AFFECTED AREA TWICE A DAY FOR 7 DAYS    . lisinopril (ZESTRIL) 40 MG tablet Take 40 mg by mouth daily.    Marland Kitchen lovastatin (MEVACOR) 20 MG tablet Take 20 mg by mouth at bedtime.    . tadalafil (CIALIS) 20 MG tablet Take 1 tablet (20 mg total) by mouth daily as needed for erectile dysfunction. 30 tablet 0  . tamsulosin (FLOMAX) 0.4 MG CAPS capsule Take 1 capsule (0.4 mg total) by mouth daily. 90 capsule 3  . traZODone (DESYREL) 50 MG tablet Take 50 mg by mouth at bedtime.    . vitamin B-12 (CYANOCOBALAMIN) 1000 MCG tablet Take 1,000 mcg by mouth daily.    Marland Kitchen lisinopril (PRINIVIL,ZESTRIL) 30 MG tablet Take 40 mg by mouth daily.      No current facility-administered medications for this  visit.    Past Medical History:  Diagnosis Date  . Anxiety   . BPH (benign prostatic hyperplasia)   . Carotid artery disease ([place])    s/p left carotid endarterectomy at [facility] in 2014  . Carotid stenosis    bilateral  . CKD (chronic kidney disease)   . Elevated PSA   . Fracture, thoracic vertebra ([place]) 05/06/2018   T3,T9 endplate fracture  . Hemothorax on left 05/06/2018  . HLD (hyperlipidemia)   . HTN (hypertension)   . Hyperlipidemia   . Hypertension   . Insomnia   . Rib fractures 05/06/2018  . Weak urinary stream     Past Surgical History:  Procedure Laterality Date  . APPENDECTOMY    . carotid stenosis Left    ces and stent placement     Social History   Tobacco Use  . Smoking status: Former Research scientist (life sciences)  . Smokeless tobacco: Never Used  . Tobacco comment: quit 40 years ago  Substance Use Topics  . Alcohol use: Yes    Alcohol/week: 4.0 standard drinks    Types: 4 Cans of beer per week    Comment: occasional 4 cans of beer/wkly  . Drug use: Never      Family History  Problem Relation Age of Onset  . Stroke Father   .  CVA Father   . Diabetes Mellitus II Father   . Prostate cancer Neg Hx   . Kidney disease Neg Hx   . Kidney cancer Neg Hx   . Bladder Cancer Neg Hx      No Known Allergies   REVIEW OF SYSTEMS (Negative unless checked)  Constitutional: [] ?Weight loss  [] ?Fever  [] ?Chills Cardiac: [] ?Chest pain   [] ?Chest pressure   [] ?Palpitations   [] ?Shortness of breath when laying flat   [] ?Shortness of breath at rest   [] ?Shortness of breath with exertion. Vascular:  [] ?Pain in legs with walking   [] ?Pain in legs at rest   [] ?Pain in legs when laying flat   [] ?Claudication   [] ?Pain in feet when walking  [] ?Pain in feet at rest  [] ?Pain in feet when laying flat   [] ?History of DVT   [] ?Phlebitis   [] ?Swelling in legs   [] ?Varicose veins   [] ?Non-healing ulcers Pulmonary:   [] ?Uses home oxygen   [] ?Productive cough   [] ?Hemoptysis   [] ?Wheeze  [] ?COPD    [] ?Asthma Neurologic:  [] ?Dizziness  [] ?Blackouts   [] ?Seizures   [] ?History of stroke   [] ?History of TIA  [] ?Aphasia   [] ?Temporary blindness   [] ?Dysphagia   [] ?Weakness or numbness in arms   [] ?Weakness or numbness in legs Musculoskeletal:  [x] ?Arthritis   [] ?Joint swelling   [x] ?Joint pain   [] ?Low back pain Hematologic:  [] ?Easy bruising  [] ?Easy bleeding   [] ?Hypercoagulable state   [] ?Anemic  [] ?Hepatitis Gastrointestinal:  [] ?Blood in stool   [] ?Vomiting blood  [] ?Gastroesophageal reflux/heartburn   [] ?Difficulty swallowing. Genitourinary:  [x] ?Chronic kidney disease   [] ?Difficult urination  [x] ?Frequent urination  [] ?Burning with urination   [] ?Blood in urine Skin:  [] ?Rashes   [] ?Ulcers   [] ?Wounds Psychological:  [] ?History of anxiety   [] ? History of major depression.  Physical Examination  Vitals:   11/29/20 1129  BP: (!) 174/68  Pulse: 66  Resp: 16  Weight: 151 lb 11.2 oz (68.8 kg)   Body mass index is 23.76 kg/m. Gen:  WD/WN, NAD. Appears younger than stated age. Head: [place]/AT, No temporalis wasting. Ear/Nose/Throat: Hearing grossly intact, nares w/o erythema or drainage, trachea midline Eyes: Conjunctiva clear. Sclera non-icteric Neck: Supple.  Right carotid bruit  Pulmonary:  Good air movement, equal and clear to auscultation bilaterally.  Cardiac: RRR, No JVD Vascular:  Vessel Right Left  Radial Palpable Palpable   Musculoskeletal: M/S 5/5 throughout.  No deformity or atrophy. No edema. Neurologic: CN 2-12 intact. Sensation grossly intact in extremities.  Symmetrical.  Speech is fluent. Motor exam as listed above. Psychiatric: Judgment intact, Mood & affect appropriate for pt's clinical situation. Dermatologic: No rashes or ulcers noted.  No cellulitis or open wounds.      CBC Lab Results  Component Value Date   WBC 7.5 04/25/2018   HGB 10.5 (L) 04/25/2018   HCT 30.9 (L) 04/25/2018   MCV 94.8 04/25/2018   PLT 192 04/25/2018    BMET    Component  Value Date/Time   NA 137 04/25/2018 0255   NA 139 08/11/2013 0406   K 4.0 04/25/2018 0255   K 4.2 08/11/2013 0406   CL 103 04/25/2018 0255   CL 107 08/11/2013 0406   CO2 27 04/25/2018 0255   CO2 29 08/11/2013 0406   GLUCOSE 133 (H) 04/25/2018 0255   GLUCOSE 101 (H) 08/11/2013 0406   BUN 28 (H) 04/25/2018 0255   BUN 18 08/11/2013 0406   CREATININE 1.29 (H) 04/25/2018 0255   CREATININE  1.43 (H) 08/11/2013 0406   CALCIUM 8.6 (L) 04/25/2018 0255   CALCIUM 8.7 08/11/2013 0406   GFRNONAA 52 (L) 04/25/2018 0255   GFRNONAA 49 (L) 08/11/2013 0406   GFRAA >60 04/25/2018 0255   GFRAA 56 (L) 08/11/2013 0406   CrCl cannot be calculated (Patient's most recent lab result is older than the maximum 21 days allowed.).  COAG Lab Results  Component Value Date   INR 1.06 04/16/2018   INR 1.1 08/11/2013    Radiology No results found.   Assessment/Plan Hypertension blood pressure control important in reducing the progression of atherosclerotic disease. On appropriate oral medications.No changes   CKD (chronic kidney disease) stage 3, GFR 30-59 ml/min Try to follow with carotid duplex at this point and avoid contrast.  If there is concern for progression to high-grade range, CT angiogram would likely not be performed and we would go straight to catheter-based angiogram to limit contrast.  Hyperlipidemia lipid control important in reducing the progression of atherosclerotic disease. Continue statin therapy  Bilateral carotid artery stenosis Carotid duplex today reveals a patent left carotid artery stent and stenosis in the upper end of the 40 to 59% range on the right which is unchanged from previous studies.  Continue current medical regimen including aspirin and statin agent.  No role for intervention at this time.  Recheck in 6 months.    Leotis Pain, MD  11/29/2020 11:57 AM    This note was created with Dragon medical transcription system.  Any errors from dictation are purely  unintentional

## 2021-06-06 ENCOUNTER — Encounter (INDEPENDENT_AMBULATORY_CARE_PROVIDER_SITE_OTHER): Payer: Self-pay

## 2021-06-06 ENCOUNTER — Ambulatory Visit (INDEPENDENT_AMBULATORY_CARE_PROVIDER_SITE_OTHER): Payer: Medicare Other

## 2021-06-06 ENCOUNTER — Other Ambulatory Visit: Payer: Self-pay

## 2021-06-06 ENCOUNTER — Ambulatory Visit (INDEPENDENT_AMBULATORY_CARE_PROVIDER_SITE_OTHER): Payer: Medicare Other | Admitting: Vascular Surgery

## 2021-06-06 DIAGNOSIS — I6523 Occlusion and stenosis of bilateral carotid arteries: Secondary | ICD-10-CM | POA: Diagnosis not present

## 2021-06-20 ENCOUNTER — Encounter (INDEPENDENT_AMBULATORY_CARE_PROVIDER_SITE_OTHER): Payer: Self-pay | Admitting: *Deleted

## 2021-07-05 ENCOUNTER — Other Ambulatory Visit: Payer: Self-pay | Admitting: Urology

## 2021-08-02 NOTE — Progress Notes (Signed)
08/03/21 9:08 AM   [name] 09-May-1941 [id number]  Referring provider:  Tracie Harrier, [place] [facility] [place],  [place] [address]  Chief Complaint  Patient presents with   Benign Prostatic Hypertrophy    Urological history  BPH with LUTS  - S/p prostate biopsy with negative results in 2014  - Managed on finasteride 5 mg and tamsulosin 0.4 g  - IPSS 11/2  2. Erectile dysfunction  - Managed on Tadalafil 20 mg on-demand-dosing  - SHIM 10  3. Ejaculatory disorder  - as above   HPI: Xavier Davis is a 80 y.o.male who returns today for 1 year follow-up with IPSS, SHIM, and DRE.   He is doing well today. He is most concerned about his erections. He is only able to obtain a partial erection. He is currently taking Viagra since he lost his Tadalafil. He does not experience pain with erections. He also does not experience spontaneous erections.    He denies any modifying or aggravating factors.  Patient denies any gross hematuria, dysuria or suprapubic/flank pain.  Patient denies any fevers, chills, nausea or vomiting.    IPSS     Row Name 08/03/21 0800         International Prostate Symptom Score   How often have you had the sensation of not emptying your bladder? About half the time     How often have you had to urinate less than every two hours? Less than half the time     How often have you found you stopped and started again several times when you urinated? About half the time     How often have you found it difficult to postpone urination? Not at All     How often have you had a weak urinary stream? Less than half the time     How often have you had to strain to start urination? Not at All     How many times did you typically get up at night to urinate? 1 Time     Total IPSS Score 11           Quality of Life due to urinary symptoms   If you were to spend the rest of your life with your urinary condition just the way it is now  how would you feel about that? Mostly Satisfied              Score:  1-7 Mild 8-19 Moderate 20-35 Severe   Patient still having spontaneous erections.     He denies any pain or curvature with erections.     SHIM     Row Name 08/03/21 0836         SHIM: Over the last 6 months:   How do you rate your confidence that you could get and keep an erection? Very Low     When you had erections with sexual stimulation, how often were your erections hard enough for penetration (entering your partner)? A Few Times (much less than half the time)     During sexual intercourse, how often were you able to maintain your erection after you had penetrated (entered) your partner? A Few Times (much less than half the time)     During sexual intercourse, how difficult was it to maintain your erection to completion of intercourse? Difficult     When you attempted sexual intercourse, how often was it satisfactory for you? A Few Times (much less than half  the time)           SHIM Total Score   SHIM 10                  PMH: Past Medical History:  Diagnosis Date   Anxiety    BPH (benign prostatic hyperplasia)    Carotid artery disease (HCC)    s/p left carotid endarterectomy at [facility] in 2014   Carotid stenosis    bilateral   CKD (chronic kidney disease)    Elevated PSA    Fracture, thoracic vertebra ([place]) 05/06/2018   T3,T9 endplate fracture   Hemothorax on left 05/06/2018   HLD (hyperlipidemia)    HTN (hypertension)    Hyperlipidemia    Hypertension    Insomnia    Rib fractures 05/06/2018   Weak urinary stream     Surgical History: Past Surgical History:  Procedure Laterality Date   APPENDECTOMY     carotid stenosis Left    ces and stent placement    Home Medications:  Allergies as of 08/03/2021       Reactions   Metoprolol    Other reaction(s): DROWSINESS   Metoprolol Tartrate    Other reaction(s): Other (See Comments)        Medication List         Accurate as of August 03, 2021  9:08 AM. If you have any questions, ask your nurse or doctor.          STOP taking these medications    aspirin 325 MG tablet Stopped by: Teffany Blaszczyk, PA-C       TAKE these medications    amLODipine 2.5 MG tablet Commonly known as: NORVASC Take 2.5 mg by mouth daily.   amLODipine 5 MG tablet Commonly known as: NORVASC Take 7.5 mg by mouth daily.   citalopram 40 MG tablet Commonly known as: CELEXA Take by mouth.   ferrous sulfate 325 (65 FE) MG tablet Take 325 mg by mouth daily with breakfast.   finasteride 5 MG tablet Commonly known as: PROSCAR TAKE 1 TABLET BY MOUTH DAILY   hydrocortisone 2.5 % cream APPLY TO AFFECTED AREA TWICE A DAY FOR 7 DAYS   lisinopril 40 MG tablet Commonly known as: ZESTRIL Take 40 mg by mouth daily. What changed: Another medication with the same name was removed. Continue taking this medication, and follow the directions you see here. Changed by: Zara Council, PA-C   lovastatin 20 MG tablet Commonly known as: MEVACOR Take 20 mg by mouth at bedtime.   tadalafil 20 MG tablet Commonly known as: CIALIS Take 1 tablet (20 mg total) by mouth daily as needed for erectile dysfunction.   tamsulosin 0.4 MG Caps capsule Commonly known as: FLOMAX Take 1 capsule (0.4 mg total) by mouth daily.   terazosin 2 MG capsule Commonly known as: HYTRIN Take 1 capsule by mouth at bedtime.   traZODone 50 MG tablet Commonly known as: DESYREL Take 50 mg by mouth at bedtime.   vitamin B-12 1000 MCG tablet Commonly known as: CYANOCOBALAMIN Take 1,000 mcg by mouth daily.        Allergies:  Allergies  Allergen Reactions   Metoprolol     Other reaction(s): DROWSINESS   Metoprolol Tartrate     Other reaction(s): Other (See Comments)    Family History: Family History  Problem Relation Age of Onset   Stroke Father    CVA Father    Diabetes Mellitus II Father    Prostate cancer Neg Hx  Kidney  disease Neg Hx    Kidney cancer Neg Hx    Bladder Cancer Neg Hx     Social History:  reports that he has quit smoking. He has never used smokeless tobacco. He reports current alcohol use of about 4.0 standard drinks per week. He reports that he does not use drugs.   Physical Exam: BP (!) 170/67   Pulse 74   Ht $R'5\' 7"'LD$  (1.702 m)   Wt 152 lb (68.9 kg)   BMI 23.81 kg/m   Constitutional:  Alert and oriented, No acute distress. HEENT: [place] AT, mask in place.  Trachea midline Cardiovascular: No clubbing, cyanosis, or edema. Respiratory: Normal respiratory effort, no increased work of breathing. Neurologic: Grossly intact, no focal deficits, moving all 4 extremities. Psychiatric: Normal mood and affect.  Laboratory Data: PSA (Prostate Specific Antigen), Total 0.10 - 4.00 ng/mL 0.91   Resulting Agency  [place] - LAB  Narrative Performed by [facility] - LAB Test results were determined with Beckman Coulter Hybritech Assay. Values obtained with different assay methods cannot be used interchangeably in serial testing. Assay results should not be interpreted as absolute evidence of the presence or absence of malignant disease Specimen Collected: 06/28/21 08:44 Last Resulted: 06/28/21 10:59  Received From: [place]  Result Received: 08/02/21 08:40   WBC (White Blood Cell Count) 4.1 - 10.2 10^3/uL 5.7   RBC (Red Blood Cell Count) 4.69 - 6.13 10^6/uL 4.08 Low    Hemoglobin 14.1 - 18.1 gm/dL 13.6 Low    Hematocrit 40.0 - 52.0 % 38.6 Low    MCV (Mean Corpuscular Volume) 80.0 - 100.0 fl 94.6   MCH (Mean Corpuscular Hemoglobin) 27.0 - 31.2 pg 33.3 High    MCHC (Mean Corpuscular Hemoglobin Concentration) 32.0 - 36.0 gm/dL 35.2   Platelet Count 150 - 450 10^3/uL 147 Low    RDW-CV (Red Cell Distribution Width) 11.6 - 14.8 % 12.2   MPV (Mean Platelet Volume) 9.4 - 12.4 fl 9.9   Neutrophils 1.50 - 7.80 10^3/uL 3.63   Lymphocytes 1.00 - 3.60 10^3/uL 1.46    Monocytes 0.00 - 1.50 10^3/uL 0.49   Eosinophils 0.00 - 0.55 10^3/uL 0.09   Basophils 0.00 - 0.09 10^3/uL 0.03   Neutrophil % 32.0 - 70.0 % 63.5   Lymphocyte % 10.0 - 50.0 % 25.6   Monocyte % 4.0 - 13.0 % 8.6   Eosinophil % 1.0 - 5.0 % 1.6   Basophil% 0.0 - 2.0 % 0.5   Immature Granulocyte % <=0.7 % 0.2   Immature Granulocyte Count <=0.06 10^3/L 0.01   Resulting Agency  [place] - LAB  Specimen Collected: 06/28/21 08:44 Last Resulted: 06/28/21 09:07  Received From: [place]  Result Received: 08/02/21 08:40   Thyroid Stimulating Hormone (TSH) 0.450-5.330 uIU/ml uIU/mL 1.084   Resulting Agency  [place] - LAB  Specimen Collected: 06/28/21 08:44 Last Resulted: 06/28/21 10:59  Received From: [place]  Result Received: 08/02/21 08:40    Hemoglobin A1C 4.2 - 5.6 % 6.9 High    Average Blood Glucose (Calc) mg/dL 151   Resulting Agency  [place] - LAB  Narrative Performed by [facility] - LAB Normal Range:    4.2 - 5.6%  Increased Risk:  5.7 - 6.4%  Diabetes:        >= 6.5%  Glycemic Control for adults with diabetes:  <7%   Specimen Collected: 06/28/21 08:44 Last Resulted: 06/28/21  10:09  Received From: [place]  Result Received: 08/02/21 08:40   Color Yellow, Violet, Light Violet, Dark Violet Yellow   Clarity Clear, Other Clear   Specific Gravity 1.000 - 1.030 1.010   pH, Urine 5.0 - 8.0 6.0   Protein, Urinalysis Negative, Trace mg/dL Trace   Glucose, Urinalysis Negative mg/dL Negative   Ketones, Urinalysis Negative mg/dL Negative   Blood, Urinalysis Negative Small Abnormal    Nitrite, Urinalysis Negative Negative   Leukocyte Esterase, Urinalysis Negative Negative   White Blood Cells, Urinalysis None Seen, 0-3 /hpf None Seen   Red Blood Cells, Urinalysis None Seen, 0-3 /hpf 0-3   Bacteria, Urinalysis None Seen /hpf Rare Abnormal    Squamous Epithelial Cells, Urinalysis  Rare, Few, None Seen /hpf Rare   Resulting Agency  [place] - LAB  Specimen Collected: 06/28/21 08:44 Last Resulted: 06/28/21 09:34  Received From: [place]  Result Received: 08/02/21 08:40   Cholesterol, Total 100 - 200 mg/dL 152   Triglyceride 35 - 199 mg/dL 157   HDL (High Density Lipoprotein) Cholesterol 29.0 - 71.0 mg/dL 38.8   LDL Calculated 0 - 130 mg/dL 82   VLDL Cholesterol mg/dL 31   Cholesterol/HDL Ratio  3.9   Resulting Agency  [place] - LAB  Specimen Collected: 06/28/21 08:44 Last Resulted: 06/28/21 11:00  Received From: [place]  Result Received: 08/02/21 08:40   Glucose 70 - 110 mg/dL 132 High    Sodium 136 - 145 mmol/L 139   Potassium 3.6 - 5.1 mmol/L 4.4   Chloride 97 - 109 mmol/L 104   Carbon Dioxide (CO2) 22.0 - 32.0 mmol/L 29.9   Urea Nitrogen (BUN) 7 - 25 mg/dL 32 High    Creatinine 0.7 - 1.3 mg/dL 1.6 High    Glomerular Filtration Rate (eGFR), MDRD Estimate >60 mL/min/1.73sq m 42 Low    Calcium 8.7 - 10.3 mg/dL 9.4   AST  8 - 39 U/L 17   ALT  6 - 57 U/L 17   Alk Phos (alkaline Phosphatase) 34 - 104 U/L 50   Albumin 3.5 - 4.8 g/dL 4.5   Bilirubin, Total 0.3 - 1.2 mg/dL 1.1   Protein, Total 6.1 - 7.9 g/dL 6.6   A/G Ratio 1.0 - 5.0 gm/dL 2.1   Resulting Agency  [place] - LAB  Specimen Collected: 06/28/21 08:44 Last Resulted: 06/28/21 11:00  Received From: [place]  Result Received: 08/02/21 08:40  I have reviewed the labs.   Pertinent Imaging: N/A  Assessment & Plan:    BPH with LUTS  - IPSS 11/2 , it is worsening  - Continue conservative management, avoiding bladder irritants and timed voiding's - Continue finasteride 5 mg daily and tamsulosin 0.4 mg;   ED - SHIM score 10  - Viagra is no longer effective he was still taking this due to misplacing his Tadalafil. - Start tadalafil 20 mg on-demand-dosing; refill sent to [facility].  Ejaculatory  disorder  - He is not able to obtain full erections that last long enough for intercourse, therefore it is unknown if his ejaculatory disorder still persists. Will reassess during next visit.   Return in about 1 year (around 08/03/2022) for IPSS, SHIM .  [facility] [address], [place], [place] [address] [phone]  [facility] as a scribe for [facility], PA-C.,have documented all relevant documentation on the behalf of [facility], PA-C,as directed by  Gradie Butrick, PA-C while in the presence of Charline Hoskinson, PA-C.  I have reviewed the above documentation for accuracy and completeness, and I agree with the above.    Zara Council, PA-C

## 2021-08-03 ENCOUNTER — Encounter: Payer: Self-pay | Admitting: Urology

## 2021-08-03 ENCOUNTER — Ambulatory Visit (INDEPENDENT_AMBULATORY_CARE_PROVIDER_SITE_OTHER): Payer: Medicare Other | Admitting: Urology

## 2021-08-03 ENCOUNTER — Other Ambulatory Visit: Payer: Self-pay

## 2021-08-03 VITALS — BP 170/67 | HR 74 | Ht 67.0 in | Wt 152.0 lb

## 2021-08-03 DIAGNOSIS — N401 Enlarged prostate with lower urinary tract symptoms: Secondary | ICD-10-CM

## 2021-08-03 DIAGNOSIS — I6523 Occlusion and stenosis of bilateral carotid arteries: Secondary | ICD-10-CM | POA: Diagnosis not present

## 2021-08-03 DIAGNOSIS — N138 Other obstructive and reflux uropathy: Secondary | ICD-10-CM | POA: Diagnosis not present

## 2021-08-03 DIAGNOSIS — N529 Male erectile dysfunction, unspecified: Secondary | ICD-10-CM | POA: Diagnosis not present

## 2021-08-03 MED ORDER — TADALAFIL 20 MG PO TABS: 20.0000 mg | ORAL_TABLET | Freq: Every day | ORAL | 3 refills | Status: DC | PRN

## 2021-08-03 MED ORDER — TAMSULOSIN HCL 0.4 MG PO CAPS: 0.4000 mg | ORAL_CAPSULE | Freq: Every day | ORAL | 3 refills | Status: DC

## 2021-11-22 ENCOUNTER — Other Ambulatory Visit: Payer: Self-pay | Admitting: Urology

## 2021-11-22 ENCOUNTER — Telehealth: Payer: Self-pay | Admitting: *Deleted

## 2021-11-22 MED ORDER — SILDENAFIL CITRATE 100 MG PO TABS: 100.0000 mg | ORAL_TABLET | Freq: Every day | ORAL | 0 refills | Status: DC | PRN

## 2021-11-22 NOTE — Telephone Encounter (Signed)
Patient called in and states he would like VIAGRA) called in to [facility]. He is on Cialis 20 now . He states it does not work well.

## 2021-12-25 ENCOUNTER — Other Ambulatory Visit (INDEPENDENT_AMBULATORY_CARE_PROVIDER_SITE_OTHER): Payer: Self-pay | Admitting: Nurse Practitioner

## 2021-12-25 DIAGNOSIS — I6523 Occlusion and stenosis of bilateral carotid arteries: Secondary | ICD-10-CM

## 2021-12-26 ENCOUNTER — Other Ambulatory Visit: Payer: Self-pay

## 2021-12-26 ENCOUNTER — Ambulatory Visit (INDEPENDENT_AMBULATORY_CARE_PROVIDER_SITE_OTHER): Payer: Medicare Other | Admitting: Vascular Surgery

## 2021-12-26 ENCOUNTER — Ambulatory Visit (INDEPENDENT_AMBULATORY_CARE_PROVIDER_SITE_OTHER): Payer: Medicare Other

## 2021-12-26 VITALS — BP 155/66 | HR 58 | Ht 67.0 in | Wt 149.0 lb

## 2021-12-26 DIAGNOSIS — I6523 Occlusion and stenosis of bilateral carotid arteries: Secondary | ICD-10-CM | POA: Diagnosis not present

## 2021-12-26 DIAGNOSIS — N183 Chronic kidney disease, stage 3 unspecified: Secondary | ICD-10-CM | POA: Diagnosis not present

## 2021-12-26 DIAGNOSIS — I10 Essential (primary) hypertension: Secondary | ICD-10-CM | POA: Diagnosis not present

## 2021-12-26 DIAGNOSIS — E785 Hyperlipidemia, unspecified: Secondary | ICD-10-CM | POA: Diagnosis not present

## 2021-12-26 DIAGNOSIS — H25813 Combined forms of age-related cataract, bilateral: Secondary | ICD-10-CM | POA: Insufficient documentation

## 2021-12-26 NOTE — Assessment & Plan Note (Signed)
Duplex today shows a marked progression in his velocities in the right internal carotid artery now consistent with an 80 to 99% stenosis.  His left carotid endarterectomy site remains patent without significant recurrent stenosis.  We had a long discussion today regarding what this means.  We discussed the pathophysiology and natural history of carotid disease.  A high-grade lesion would put him at a significantly increased risk of stroke.  His chronic kidney disease likely precludes CT angiogram.  At this point, a carotid angiogram is the prudent option to further evaluate the degree of stenosis.  If his anatomy is amenable for carotid stent placement, it would be recommended to go ahead and proceed with right carotid artery stenting at that time.  He had left carotid stent placement about 8 years ago and it has done well.  If his anatomy is not amenable but high-grade disease is seen, consideration for carotid endarterectomy will then be given.  It is also possible that the carotid duplex is overestimated the degree of stenosis, and a diagnostic angiogram alone will be performed if that is the case.  The patient voices understanding of the situation and agrees to proceed.

## 2021-12-26 NOTE — Progress Notes (Signed)
MRN : [id number]  Xavier Davis is a 81 y.o. (1941/05/06) male who presents with chief complaint of  Chief Complaint  Patient presents with   Follow-up    6 Mo carotid  .  History of Present Illness: Patient returns in follow-up of his carotid disease.  He is doing well today.  He has a previous history of left carotid artery stent placement for high-grade recurrent stenosis in the left carotid artery about 8 years ago.  He has done well from this.  He has known right carotid artery stenosis that has been in the moderate range previously.  No focal neurologic symptoms since his last visit. Specifically, the patient denies amaurosis fugax, speech or swallowing difficulties, or arm or leg weakness or numbness. Duplex today shows a marked progression in his velocities in the right internal carotid artery now consistent with an 80 to 99% stenosis.  His left carotid endarterectomy site remains patent without significant recurrent stenosis.   Current Outpatient Medications  Medication Sig Dispense Refill   amLODipine (NORVASC) 2.5 MG tablet Take 2.5 mg by mouth daily.     amLODipine (NORVASC) 5 MG tablet Take 7.5 mg by mouth daily.     citalopram (CELEXA) 40 MG tablet Take by mouth.     ferrous sulfate 325 (65 FE) MG tablet Take 325 mg by mouth daily with breakfast.      finasteride (PROSCAR) 5 MG tablet TAKE 1 TABLET BY MOUTH DAILY 90 tablet 3   hydrocortisone 2.5 % cream APPLY TO AFFECTED AREA TWICE A DAY FOR 7 DAYS     lisinopril (ZESTRIL) 40 MG tablet Take 40 mg by mouth daily.     lovastatin (MEVACOR) 20 MG tablet Take 20 mg by mouth at bedtime.     sildenafil (VIAGRA) 100 MG tablet Take 1 tablet (100 mg total) by mouth daily as needed for erectile dysfunction. 30 tablet 0   tamsulosin (FLOMAX) 0.4 MG CAPS capsule Take 1 capsule (0.4 mg total) by mouth daily. 90 capsule 3   terazosin (HYTRIN) 2 MG capsule Take 1 capsule by mouth at bedtime.     traZODone (DESYREL) 50 MG tablet Take 50 mg  by mouth at bedtime.     vitamin B-12 (CYANOCOBALAMIN) 1000 MCG tablet Take 1,000 mcg by mouth daily.     No current facility-administered medications for this visit.    Past Medical History:  Diagnosis Date   Anxiety    BPH (benign prostatic hyperplasia)    Carotid artery disease (HCC)    s/p left carotid endarterectomy at [facility] in 2014   Carotid stenosis    bilateral   CKD (chronic kidney disease)    Elevated PSA    Fracture, thoracic vertebra ([place]) 05/06/2018   T3,T9 endplate fracture   Hemothorax on left 05/06/2018   HLD (hyperlipidemia)    HTN (hypertension)    Hyperlipidemia    Hypertension    Insomnia    Rib fractures 05/06/2018   Weak urinary stream     Past Surgical History:  Procedure Laterality Date   APPENDECTOMY     carotid stenosis Left    ces and stent placement     Social History   Tobacco Use   Smoking status: Former   Smokeless tobacco: Never   Tobacco comments:    quit 40 years ago  Substance Use Topics   Alcohol use: Yes    Alcohol/week: 4.0 standard drinks    Types: 4 Cans of beer per week  Comment: occasional 4 cans of beer/wkly   Drug use: Never      Family History  Problem Relation Age of Onset   Stroke Father    CVA Father    Diabetes Mellitus II Father    Prostate cancer Neg Hx    Kidney disease Neg Hx    Kidney cancer Neg Hx    Bladder Cancer Neg Hx      Allergies  Allergen Reactions   Metoprolol     Other reaction(s): DROWSINESS   Metoprolol Tartrate     Other reaction(s): Other (See Comments)    REVIEW OF SYSTEMS (Negative unless checked)   Constitutional: [] Weight loss  [] Fever  [] Chills Cardiac: [] Chest pain   [] Chest pressure   [] Palpitations   [] Shortness of breath when laying flat   [] Shortness of breath at rest   [] Shortness of breath with exertion. Vascular:  [] Pain in legs with walking   [] Pain in legs at rest   [] Pain in legs when laying flat   [] Claudication   [] Pain in feet when walking  [] Pain in feet  at rest  [] Pain in feet when laying flat   [] History of DVT   [] Phlebitis   [] Swelling in legs   [] Varicose veins   [] Non-healing ulcers Pulmonary:   [] Uses home oxygen   [] Productive cough   [] Hemoptysis   [] Wheeze  [] COPD   [] Asthma Neurologic:  [] Dizziness  [] Blackouts   [] Seizures   [] History of stroke   [] History of TIA  [] Aphasia   [] Temporary blindness   [] Dysphagia   [] Weakness or numbness in arms   [] Weakness or numbness in legs Musculoskeletal:  [x] Arthritis   [] Joint swelling   [x] Joint pain   [] Low back pain Hematologic:  [] Easy bruising  [] Easy bleeding   [] Hypercoagulable state   [] Anemic  [] Hepatitis Gastrointestinal:  [] Blood in stool   [] Vomiting blood  [] Gastroesophageal reflux/heartburn   [] Difficulty swallowing. Genitourinary:  [x] Chronic kidney disease   [] Difficult urination  [x] Frequent urination  [] Burning with urination   [] Blood in urine Skin:  [] Rashes   [] Ulcers   [] Wounds Psychological:  [] History of anxiety   []  History of major depression.  Physical Examination  Vitals:   12/26/21 1128  BP: (!) 155/66  Pulse: (!) 58  Weight: 149 lb (67.6 kg)  Height: 5\' 7"  (1.702 m)   Body mass index is 23.34 kg/m. Gen:  WD/WN, NAD.  Appears younger than stated age Head: [place]/AT, No temporalis wasting. Ear/Nose/Throat: Hearing grossly intact, nares w/o erythema or drainage, trachea midline Eyes: Conjunctiva clear. Sclera non-icteric Neck: Supple.  Right carotid bruit  Pulmonary:  Good air movement, equal and clear to auscultation bilaterally.  Cardiac: RRR, No JVD Vascular:  Vessel Right Left  Radial Palpable Palpable           Musculoskeletal: M/S 5/5 throughout.  No deformity or atrophy.  Trace lower extremity edema. Neurologic: CN 2-12 intact. Sensation grossly intact in extremities.  Symmetrical.  Speech is fluent. Motor exam as listed above. Psychiatric: Judgment intact, Mood & affect appropriate for pt's clinical situation. Dermatologic: No rashes or ulcers  noted.  No cellulitis or open wounds.     CBC Lab Results  Component Value Date   WBC 7.5 04/25/2018   HGB 10.5 (L) 04/25/2018   HCT 30.9 (L) 04/25/2018   MCV 94.8 04/25/2018   PLT 192 04/25/2018    BMET    Component Value Date/Time   NA 137 04/25/2018 0255   NA 139 08/11/2013 0406   K  4.0 04/25/2018 0255   K 4.2 08/11/2013 0406   CL 103 04/25/2018 0255   CL 107 08/11/2013 0406   CO2 27 04/25/2018 0255   CO2 29 08/11/2013 0406   GLUCOSE 133 (H) 04/25/2018 0255   GLUCOSE 101 (H) 08/11/2013 0406   BUN 28 (H) 04/25/2018 0255   BUN 18 08/11/2013 0406   CREATININE 1.29 (H) 04/25/2018 0255   CREATININE 1.43 (H) 08/11/2013 0406   CALCIUM 8.6 (L) 04/25/2018 0255   CALCIUM 8.7 08/11/2013 0406   GFRNONAA 52 (L) 04/25/2018 0255   GFRNONAA 49 (L) 08/11/2013 0406   GFRAA >60 04/25/2018 0255   GFRAA 56 (L) 08/11/2013 0406   CrCl cannot be calculated (Patient's most recent lab result is older than the maximum 21 days allowed.).  COAG Lab Results  Component Value Date   INR 1.06 04/16/2018   INR 1.1 08/11/2013    Radiology No results found.   Assessment/Plan Hypertension blood pressure control important in reducing the progression of atherosclerotic disease. On appropriate oral medications. No changes     CKD (chronic kidney disease) stage 3, GFR 30-59 ml/min Try to follow with carotid duplex at this point and avoid contrast.  If there is concern for progression to high-grade range, CT angiogram would likely not be performed and we would go straight to catheter-based angiogram to limit contrast.   Hyperlipidemia lipid control important in reducing the progression of atherosclerotic disease. Continue statin therapy  Bilateral carotid artery stenosis Duplex today shows a marked progression in his velocities in the right internal carotid artery now consistent with an 80 to 99% stenosis.  His left carotid endarterectomy site remains patent without significant recurrent  stenosis.  We had a long discussion today regarding what this means.  We discussed the pathophysiology and natural history of carotid disease.  A high-grade lesion would put him at a significantly increased risk of stroke.  His chronic kidney disease likely precludes CT angiogram.  At this point, a carotid angiogram is the prudent option to further evaluate the degree of stenosis.  If his anatomy is amenable for carotid stent placement, it would be recommended to go ahead and proceed with right carotid artery stenting at that time.  He had left carotid stent placement about 8 years ago and it has done well.  If his anatomy is not amenable but high-grade disease is seen, consideration for carotid endarterectomy will then be given.  It is also possible that the carotid duplex is overestimated the degree of stenosis, and a diagnostic angiogram alone will be performed if that is the case.  The patient voices understanding of the situation and agrees to proceed.    Leotis Pain, MD  12/26/2021 12:29 PM    This note was created with Dragon medical transcription system.  Any errors from dictation are purely unintentional

## 2022-01-10 ENCOUNTER — Telehealth (INDEPENDENT_AMBULATORY_CARE_PROVIDER_SITE_OTHER): Payer: Self-pay

## 2022-01-10 NOTE — Telephone Encounter (Signed)
I attempted to contact the patient to schedule a carotid stent placement with Dr. Lucky Cowboy in April, and a message was left for a return call. ?

## 2022-01-15 DIAGNOSIS — R9431 Abnormal electrocardiogram [ECG] [EKG]: Secondary | ICD-10-CM | POA: Insufficient documentation

## 2022-01-17 ENCOUNTER — Telehealth (INDEPENDENT_AMBULATORY_CARE_PROVIDER_SITE_OTHER): Payer: Self-pay

## 2022-01-17 NOTE — Telephone Encounter (Signed)
Spoke with the patient and per the patient he has A-fib and is seeing Dr. Nehemiah Massed for this and per the patient this is needing to be taken care of before having any procedure. A cardiac clearance sheet has been faxed to [facility] attention Dr. Nehemiah Massed to be sent back once the patient has been cleared. ?

## 2022-02-21 ENCOUNTER — Telehealth (INDEPENDENT_AMBULATORY_CARE_PROVIDER_SITE_OTHER): Payer: Self-pay

## 2022-02-21 NOTE — Telephone Encounter (Signed)
Patient returned my call and is scheduled with Dr. Lucky Cowboy on 02/26/22 for a right carotid stent placement with a 6:45 am arrival time to the MM. Pre-procedure instructions were discussed and will be mailed. ?

## 2022-02-21 NOTE — Telephone Encounter (Signed)
I attempted to contact the patient to schedule a right carotid stent placement. A message was left for a return call.  ?

## 2022-02-26 ENCOUNTER — Encounter
Admission: RE | Disposition: A | Discharge status: Home / Self Care | Payer: Self-pay | Source: Home / Self Care | Attending: Vascular Surgery

## 2022-02-26 ENCOUNTER — Inpatient Hospital Stay
Admission: RE | Admit: 2022-02-26 | Discharge: 2022-02-27 | DRG: 036 | Disposition: A | Discharge status: Home / Self Care | Payer: Medicare Other | Attending: Vascular Surgery | Admitting: Vascular Surgery

## 2022-02-26 ENCOUNTER — Encounter: Payer: Self-pay | Admitting: Vascular Surgery

## 2022-02-26 ENCOUNTER — Other Ambulatory Visit: Payer: Self-pay

## 2022-02-26 DIAGNOSIS — I129 Hypertensive chronic kidney disease with stage 1 through stage 4 chronic kidney disease, or unspecified chronic kidney disease: Secondary | ICD-10-CM | POA: Diagnosis present

## 2022-02-26 DIAGNOSIS — I6521 Occlusion and stenosis of right carotid artery: Principal | ICD-10-CM | POA: Diagnosis present

## 2022-02-26 DIAGNOSIS — Z823 Family history of stroke: Secondary | ICD-10-CM | POA: Diagnosis not present

## 2022-02-26 DIAGNOSIS — E785 Hyperlipidemia, unspecified: Secondary | ICD-10-CM | POA: Diagnosis present

## 2022-02-26 DIAGNOSIS — I214 Non-ST elevation (NSTEMI) myocardial infarction: Secondary | ICD-10-CM | POA: Diagnosis not present

## 2022-02-26 DIAGNOSIS — N183 Chronic kidney disease, stage 3 unspecified: Secondary | ICD-10-CM

## 2022-02-26 DIAGNOSIS — Z833 Family history of diabetes mellitus: Secondary | ICD-10-CM | POA: Diagnosis not present

## 2022-02-26 DIAGNOSIS — Z87891 Personal history of nicotine dependence: Secondary | ICD-10-CM | POA: Diagnosis not present

## 2022-02-26 DIAGNOSIS — Z95828 Presence of other vascular implants and grafts: Secondary | ICD-10-CM

## 2022-02-26 HISTORY — PX: CAROTID PTA/STENT INTERVENTION: CATH118231

## 2022-02-26 HISTORY — DX: Unspecified atrial fibrillation: I48.91

## 2022-02-26 LAB — MRSA NEXT GEN BY PCR, NASAL: MRSA by PCR Next Gen: NOT DETECTED

## 2022-02-26 LAB — CREATININE, SERUM
Creatinine, Ser: 1.85 mg/dL — ABNORMAL HIGH (ref 0.61–1.24)
GFR, Estimated: 36 mL/min — ABNORMAL LOW (ref >60)

## 2022-02-26 LAB — POCT ACTIVATED CLOTTING TIME: Activated Clotting Time: 299 seconds

## 2022-02-26 LAB — GLUCOSE, CAPILLARY: Glucose-Capillary: 145 mg/dL — ABNORMAL HIGH (ref 70–99)

## 2022-02-26 LAB — BUN: BUN: 46 mg/dL — ABNORMAL HIGH (ref 8–23)

## 2022-02-26 SURGERY — CAROTID PTA/STENT INTERVENTION
Anesthesia: Moderate Sedation | Laterality: Right

## 2022-02-26 MED ORDER — FAMOTIDINE 20 MG PO TABS: 40.0000 mg | ORAL_TABLET | Freq: Once | ORAL | Status: DC | PRN

## 2022-02-26 MED ORDER — PHENOL 1.4 % MT LIQD: 1.0000 | OROMUCOSAL | Status: DC | PRN

## 2022-02-26 MED ORDER — IODIXANOL 320 MG/ML IV SOLN
INTRAVENOUS | Status: DC | PRN
  Administered 2022-02-26: 50 mL via INTRA_ARTERIAL

## 2022-02-26 MED ORDER — SODIUM CHLORIDE 0.9 % IV BOLUS
INTRAVENOUS | Status: AC | PRN
  Administered 2022-02-26: 250 mL via INTRAVENOUS

## 2022-02-26 MED ORDER — AMLODIPINE BESYLATE 5 MG PO TABS: 7.5000 mg | ORAL_TABLET | Freq: Every day | ORAL | Status: DC

## 2022-02-26 MED ORDER — ACETAMINOPHEN 650 MG RE SUPP: 325.0000 mg | RECTAL | Status: DC | PRN

## 2022-02-26 MED ORDER — ATROPINE SULFATE 1 MG/10ML IJ SOSY
PREFILLED_SYRINGE | INTRAMUSCULAR | Status: AC
  Filled 2022-02-26: qty 10

## 2022-02-26 MED ORDER — CARVEDILOL 3.125 MG PO TABS
3.1250 mg | ORAL_TABLET | Freq: Two times a day (BID) | ORAL | Status: DC
  Filled 2022-02-26: qty 1

## 2022-02-26 MED ORDER — FENTANYL CITRATE (PF) 100 MCG/2ML IJ SOLN
INTRAMUSCULAR | Status: DC | PRN
  Administered 2022-02-26: 50 ug via INTRAVENOUS

## 2022-02-26 MED ORDER — HYDRALAZINE HCL 20 MG/ML IJ SOLN: 5.0000 mg | INTRAMUSCULAR | Status: DC | PRN

## 2022-02-26 MED ORDER — FAMOTIDINE IN NACL 20-0.9 MG/50ML-% IV SOLN
20.0000 mg | Freq: Two times a day (BID) | INTRAVENOUS | Status: DC
  Administered 2022-02-26 (×2): 20 mg via INTRAVENOUS
  Filled 2022-02-26 (×2): qty 50

## 2022-02-26 MED ORDER — SODIUM CHLORIDE 0.9 % IV SOLN: INTRAVENOUS | Status: DC

## 2022-02-26 MED ORDER — CEFAZOLIN SODIUM-DEXTROSE 2-4 GM/100ML-% IV SOLN
2.0000 g | Freq: Three times a day (TID) | INTRAVENOUS | Status: AC
  Administered 2022-02-26 (×2): 2 g via INTRAVENOUS
  Filled 2022-02-26 (×2): qty 100

## 2022-02-26 MED ORDER — ONDANSETRON HCL 4 MG/2ML IJ SOLN: 4.0000 mg | Freq: Four times a day (QID) | INTRAMUSCULAR | Status: DC | PRN

## 2022-02-26 MED ORDER — ACETAMINOPHEN 325 MG PO TABS: 325.0000 mg | ORAL_TABLET | ORAL | Status: DC | PRN

## 2022-02-26 MED ORDER — HYDROMORPHONE HCL 1 MG/ML IJ SOLN: 1.0000 mg | Freq: Once | INTRAMUSCULAR | Status: DC | PRN

## 2022-02-26 MED ORDER — ASPIRIN EC 81 MG PO TBEC
81.0000 mg | DELAYED_RELEASE_TABLET | Freq: Every day | ORAL | Status: DC
  Administered 2022-02-27: 81 mg via ORAL
  Filled 2022-02-26: qty 1

## 2022-02-26 MED ORDER — MORPHINE SULFATE (PF) 2 MG/ML IV SOLN: 2.0000 mg | INTRAVENOUS | Status: DC | PRN

## 2022-02-26 MED ORDER — PHENYLEPHRINE HCL-NACL 20-0.9 MG/250ML-% IV SOLN
INTRAVENOUS | Status: AC
  Filled 2022-02-26: qty 250

## 2022-02-26 MED ORDER — TRAZODONE HCL 50 MG PO TABS
50.0000 mg | ORAL_TABLET | Freq: Every day | ORAL | Status: DC
  Administered 2022-02-26: 50 mg via ORAL
  Filled 2022-02-26: qty 1

## 2022-02-26 MED ORDER — ATROPINE SULFATE 1 MG/10ML IJ SOSY
PREFILLED_SYRINGE | INTRAMUSCULAR | Status: DC | PRN
Start: 2022-02-26 — End: 2022-02-26
  Administered 2022-02-26: .5 mg via INTRAVENOUS
  Administered 2022-02-26: 1 mg via INTRAVENOUS

## 2022-02-26 MED ORDER — POTASSIUM CHLORIDE CRYS ER 20 MEQ PO TBCR: 20.0000 meq | EXTENDED_RELEASE_TABLET | Freq: Every day | ORAL | Status: DC | PRN

## 2022-02-26 MED ORDER — MIDAZOLAM HCL 2 MG/2ML IJ SOLN
INTRAMUSCULAR | Status: DC | PRN
  Administered 2022-02-26: 1 mg via INTRAVENOUS

## 2022-02-26 MED ORDER — DOPAMINE-DEXTROSE 3.2-5 MG/ML-% IV SOLN
INTRAVENOUS | Status: AC
  Filled 2022-02-26: qty 250

## 2022-02-26 MED ORDER — FERROUS SULFATE 325 (65 FE) MG PO TABS: 325.0000 mg | ORAL_TABLET | Freq: Every day | ORAL | Status: DC

## 2022-02-26 MED ORDER — DOPAMINE-DEXTROSE 3.2-5 MG/ML-% IV SOLN
0.0000 ug/kg/min | INTRAVENOUS | Status: DC
  Administered 2022-02-26: 5 ug/kg/min via INTRAVENOUS
  Filled 2022-02-26: qty 250

## 2022-02-26 MED ORDER — CEFAZOLIN SODIUM-DEXTROSE 2-4 GM/100ML-% IV SOLN: 2.0000 g | INTRAVENOUS | Status: AC

## 2022-02-26 MED ORDER — LISINOPRIL 20 MG PO TABS
40.0000 mg | ORAL_TABLET | Freq: Every day | ORAL | Status: DC
Start: 2022-02-27 — End: 2022-02-27

## 2022-02-26 MED ORDER — OXYCODONE-ACETAMINOPHEN 5-325 MG PO TABS: 1.0000 | ORAL_TABLET | ORAL | Status: DC | PRN

## 2022-02-26 MED ORDER — CLOPIDOGREL BISULFATE 75 MG PO TABS
75.0000 mg | ORAL_TABLET | Freq: Every day | ORAL | Status: DC
  Administered 2022-02-27: 75 mg via ORAL
  Filled 2022-02-26: qty 1

## 2022-02-26 MED ORDER — SODIUM CHLORIDE 0.9 % IV BOLUS
500.0000 mL | Freq: Once | INTRAVENOUS | Status: AC
  Administered 2022-02-26: 500 mL via INTRAVENOUS

## 2022-02-26 MED ORDER — VITAMIN B-12 1000 MCG PO TABS: 1000.0000 ug | ORAL_TABLET | Freq: Every day | ORAL | Status: DC

## 2022-02-26 MED ORDER — GUAIFENESIN-DM 100-10 MG/5ML PO SYRP: 15.0000 mL | ORAL_SOLUTION | ORAL | Status: DC | PRN

## 2022-02-26 MED ORDER — SODIUM CHLORIDE 0.9 % IV SOLN
500.0000 mL | Freq: Once | INTRAVENOUS | Status: AC | PRN
  Administered 2022-02-26: 500 mL via INTRAVENOUS

## 2022-02-26 MED ORDER — PHENYLEPHRINE 80 MCG/ML (10ML) SYRINGE FOR IV PUSH (FOR BLOOD PRESSURE SUPPORT)
PREFILLED_SYRINGE | INTRAVENOUS | Status: AC
  Filled 2022-02-26: qty 10

## 2022-02-26 MED ORDER — HEPARIN SODIUM (PORCINE) 1000 UNIT/ML IJ SOLN
INTRAMUSCULAR | Status: DC | PRN
Start: 2022-02-26 — End: 2022-02-26
  Administered 2022-02-26: 8000 [IU] via INTRAVENOUS

## 2022-02-26 MED ORDER — MAGNESIUM SULFATE 2 GM/50ML IV SOLN
2.0000 g | Freq: Every day | INTRAVENOUS | Status: DC | PRN
  Filled 2022-02-26: qty 50

## 2022-02-26 MED ORDER — DIPHENHYDRAMINE HCL 50 MG/ML IJ SOLN: 50.0000 mg | Freq: Once | INTRAMUSCULAR | Status: DC | PRN

## 2022-02-26 MED ORDER — TERAZOSIN HCL 2 MG PO CAPS: 2.0000 mg | ORAL_CAPSULE | Freq: Every day | ORAL | Status: DC

## 2022-02-26 MED ORDER — CITALOPRAM HYDROBROMIDE 20 MG PO TABS
40.0000 mg | ORAL_TABLET | Freq: Every day | ORAL | Status: DC
  Filled 2022-02-26: qty 2

## 2022-02-26 MED ORDER — CEFAZOLIN SODIUM-DEXTROSE 2-4 GM/100ML-% IV SOLN
INTRAVENOUS | Status: AC
  Administered 2022-02-26: 2 g via INTRAVENOUS
  Filled 2022-02-26: qty 100

## 2022-02-26 MED ORDER — FENTANYL CITRATE PF 50 MCG/ML IJ SOSY
PREFILLED_SYRINGE | INTRAMUSCULAR | Status: AC
  Filled 2022-02-26: qty 2

## 2022-02-26 MED ORDER — AMLODIPINE BESYLATE 5 MG PO TABS: 2.5000 mg | ORAL_TABLET | Freq: Every day | ORAL | Status: DC

## 2022-02-26 MED ORDER — MIDAZOLAM HCL 2 MG/2ML IJ SOLN
INTRAMUSCULAR | Status: AC
  Filled 2022-02-26: qty 4

## 2022-02-26 MED ORDER — CHLORHEXIDINE GLUCONATE CLOTH 2 % EX PADS
6.0000 | MEDICATED_PAD | Freq: Every day | CUTANEOUS | Status: DC
  Administered 2022-02-26 – 2022-02-27 (×2): 6 via TOPICAL

## 2022-02-26 MED ORDER — ONDANSETRON HCL 4 MG/2ML IJ SOLN
4.0000 mg | Freq: Four times a day (QID) | INTRAMUSCULAR | Status: DC | PRN
  Administered 2022-02-26: 4 mg via INTRAVENOUS
  Filled 2022-02-26: qty 2

## 2022-02-26 MED ORDER — ALUM & MAG HYDROXIDE-SIMETH 200-200-20 MG/5ML PO SUSP: 15.0000 mL | ORAL | Status: DC | PRN

## 2022-02-26 MED ORDER — TAMSULOSIN HCL 0.4 MG PO CAPS
0.4000 mg | ORAL_CAPSULE | Freq: Every day | ORAL | Status: DC
  Filled 2022-02-26: qty 1

## 2022-02-26 MED ORDER — PRAVASTATIN SODIUM 20 MG PO TABS
20.0000 mg | ORAL_TABLET | Freq: Every day | ORAL | Status: DC
Start: 2022-02-26 — End: 2022-02-27
  Administered 2022-02-26: 20 mg via ORAL
  Filled 2022-02-26: qty 1

## 2022-02-26 MED ORDER — MIDAZOLAM HCL 2 MG/ML PO SYRP: 8.0000 mg | ORAL_SOLUTION | Freq: Once | ORAL | Status: DC | PRN

## 2022-02-26 MED ORDER — METHYLPREDNISOLONE SODIUM SUCC 125 MG IJ SOLR: 125.0000 mg | Freq: Once | INTRAMUSCULAR | Status: DC | PRN

## 2022-02-26 MED ORDER — FINASTERIDE 5 MG PO TABS
5.0000 mg | ORAL_TABLET | Freq: Every day | ORAL | Status: DC
Start: 2022-02-27 — End: 2022-02-27
  Administered 2022-02-27: 5 mg via ORAL
  Filled 2022-02-26: qty 1

## 2022-02-26 MED ORDER — AMLODIPINE BESYLATE 10 MG PO TABS
10.0000 mg | ORAL_TABLET | Freq: Every day | ORAL | Status: DC
  Filled 2022-02-26: qty 1

## 2022-02-26 MED ORDER — DOPAMINE-DEXTROSE 3.2-5 MG/ML-% IV SOLN
INTRAVENOUS | Status: AC | PRN
  Administered 2022-02-26: 2.5 ug/kg/min via INTRAVENOUS

## 2022-02-26 SURGICAL SUPPLY — 23 items
BALLN VIATRAC 4X30X135 (BALLOONS) ×2
BALLN VIATRAC 5X30X135 (BALLOONS) ×2
BALLOON VIATRAC 4X30X135 (BALLOONS) IMPLANT
BALLOON VIATRAC 5X30X135 (BALLOONS) IMPLANT
CATH ANGIO 5F PIGTAIL 100CM (CATHETERS) ×1 IMPLANT
CATH BEACON 5 .035 100 H1 TIP (CATHETERS) ×1 IMPLANT
COVER PROBE U/S 5X48 (MISCELLANEOUS) ×1 IMPLANT
DEVICE EMBOSHIELD NAV6 4.0-7.0 (FILTER) ×1 IMPLANT
DEVICE SAFEGUARD 24CM (GAUZE/BANDAGES/DRESSINGS) ×1 IMPLANT
DEVICE STARCLOSE SE CLOSURE (Vascular Products) ×1 IMPLANT
DEVICE TORQUE .025-.038 (MISCELLANEOUS) ×1 IMPLANT
GAUZE SPONGE 4X4 12PLY STRL (GAUZE/BANDAGES/DRESSINGS) ×1 IMPLANT
GLIDEWIRE ANGLED SS 035X260CM (WIRE) ×1 IMPLANT
GUIDEWIRE VASC STIFF .038X260 (WIRE) ×1 IMPLANT
KIT CAROTID MANIFOLD (MISCELLANEOUS) ×1 IMPLANT
KIT ENCORE 26 ADVANTAGE (KITS) ×1 IMPLANT
PACK ANGIOGRAPHY (CUSTOM PROCEDURE TRAY) ×3 IMPLANT
SHEATH BRITE TIP 5FRX11 (SHEATH) ×1 IMPLANT
SHEATH SHUTTLE 6FRX80 (SHEATH) ×1 IMPLANT
STENT XACT CAR 9-7X40X136 (Permanent Stent) ×1 IMPLANT
STENT XACT CAR 9X30X136 (Permanent Stent) ×1 IMPLANT
SYR MEDRAD MARK 7 150ML (SYRINGE) ×1 IMPLANT
WIRE GUIDERIGHT .035X150 (WIRE) ×1 IMPLANT

## 2022-02-26 NOTE — Op Note (Signed)
? ? ?OPERATIVE NOTE ?DATE: 02/26/2022 ? ?PROCEDURE: ? Ultrasound guidance for vascular access right femoral artery ? Placement of a 9 mm proximal, 7 mm distal, 4 cm long exact stent with the use of the NAV-6 embolic protection device in the right carotid artery ?Placement of a 9 mm x 3 cm length proximal extension stent ? ?PRE-OPERATIVE DIAGNOSIS: 1.  High-grade right carotid artery stenosis. ?2.  Previous left carotid artery stenosis status post endarterectomy and subsequent stent for recurrence ?3.  Chronic kidney disease stage III ? ?POST-OPERATIVE DIAGNOSIS:  Same as above ? ?SURGEON: Leotis Pain, MD ? ?ASSISTANT(S): None ? ?ANESTHESIA: local/MCS ? ?ESTIMATED BLOOD LOSS: 40 cc ? ?CONTRAST: 50 cc ? ?FLUORO TIME: 6.3 minutes ? ?MODERATE CONSCIOUS SEDATION TIME:  Approximately 47 minutes using 1 mg of Versed and 50 mcg of Fentanyl ? ?FINDING(S): ?1.   Greater than 90% long calcified right carotid artery stenosis with extension up to the angle of the jaw that would make surgical approach very difficult ? ?SPECIMEN(S):   none ? ?INDICATIONS:   ?Patient is a 81 y.o. male who presents with high-grade right carotid artery stenosis.  The patient has chronic kidney disease and a CT angiogram was concerning due to the contrast risk and carotid artery stenting was felt to be preferred to endarterectomy for that reason if the anatomy was amenable at the time of carotid angiogram.  Risks and benefits were discussed and informed consent was obtained.  ? ?DESCRIPTION: ?After obtaining full informed written consent, the patient was brought back to the vascular suite and placed supine upon the table.  The patient received IV antibiotics prior to induction. Moderate conscious sedation was administered during a face to face encounter with the patient throughout the procedure with my supervision of the RN administering medicines and monitoring the patients vital signs and mental status throughout from the start of the procedure until  the patient was taken to the recovery room.  After obtaining adequate anesthesia, the patient was prepped and draped in the standard fashion.   The right femoral artery was visualized with ultrasound and found to be widely patent. It was then accessed under direct ultrasound guidance without difficulty with a Seldinger needle. A permanent image was recorded. A J-wire was placed and we then placed a 6 French sheath. The patient was then heparinized and a total of 8000 units of intravenous heparin were given and an ACT was checked to confirm successful anticoagulation. A pigtail catheter was then placed into the ascending aorta. 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 I then selectively cannulated the innominate artery without difficulty with a headhunter catheter and advanced into the mid right common carotid artery.  Cervical and cerebral carotid angiography was then performed. There were no obvious intracranial filling defects with decent intracranial flow. The carotid bifurcation demonstrated a very long calcified lesion that extended up to the angle of the jaw and would be very difficult to approach surgically.  I then advanced into the external carotid artery with a Glidewire and the headhunter catheter and then exchanged for the Amplatz Super Stiff wire. Over the Amplatz Super Stiff wire, a 6 [place] shuttle sheath was placed into the mid common carotid artery. I then used the NAV-6  Embolic protection device and crossed the lesion and parked this in the distal internal carotid artery at the base of the skull.  The lesion was very tight and calcified and had to be predilated with both a 4 mm and a  5 mm balloon before being able to place the stent.  I then selected a 9 mm proximal, 7 mm distal, 4 cm long exact stent. This was deployed across the lesion but due to the very long length of the lesion a proximal extension was necessary to completely encompass the lesion.  A 9 mm x 3 cm length  stent was extended proximally into the common carotid artery. A 5 mm diameter by 3 cm length balloon was used to post dilate the stent. Only about a 15-20% residual stenosis was present after angioplasty. Completion angiogram showed normal intracranial filling without new defects. At this point I elected to terminate the procedure. The sheath was removed and StarClose closure device was deployed in the right femoral artery with excellent hemostatic result. The patient was taken to the recovery room in stable condition having tolerated the procedure well. ? ?COMPLICATIONS: none ? ?CONDITION: stable ? ?Leotis Pain ?02/26/2022 ?9:10 AM ? ? ?This note was created with Dragon Medical transcription system. Any errors in dictation are purely unintentional.  ?

## 2022-02-26 NOTE — H&P (Signed)
?[facility] ?Admission History & Physical ? ?MRN : [id number] ? ?Xavier Davis is a 81 y.o. ([birth year]) male who presents with chief complaint of No chief complaint on file. ?. ? ?History of Present Illness: Patient presents today for carotid angiogram and possible stent placement of a right carotid stenosis.  This has progressed significantly over the past year by duplex.  Previously had a left carotid endarterectomy and subsequent stent placement for recurrence.  No new symptoms.  No complaints today ? ?Current Facility-Administered Medications  ?Medication Dose Route Frequency Provider Last Rate Last Admin  ? 0.9 %  sodium chloride infusion   Intravenous Continuous Kris Hartmann, NP      ? atropine 1 MG/10ML injection           ? atropine 1 MG/10ML injection           ? ceFAZolin (ANCEF) 2-4 GM/100ML-% IVPB           ? ceFAZolin (ANCEF) IVPB 2g/100 mL premix  2 g Intravenous 30 min Pre-Op Kris Hartmann, NP      ? diphenhydrAMINE (BENADRYL) injection 50 mg  50 mg Intravenous Once PRN Kris Hartmann, NP      ? DOPamine (INTROPIN) 3.2-5 MG/ML-% infusion           ? famotidine (PEPCID) tablet 40 mg  40 mg Oral Once PRN Kris Hartmann, NP      ? fentaNYL (SUBLIMAZE) 50 MCG/ML injection           ? HYDROmorphone (DILAUDID) injection 1 mg  1 mg Intravenous Once PRN Kris Hartmann, NP      ? methylPREDNISolone sodium succinate (SOLU-MEDROL) 125 mg/2 mL injection 125 mg  125 mg Intravenous Once PRN Kris Hartmann, NP      ? midazolam (VERSED) 2 MG/2ML injection           ? midazolam (VERSED) 2 MG/ML syrup 8 mg  8 mg Oral Once PRN Kris Hartmann, NP      ? ondansetron [facility]) injection 4 mg  4 mg Intravenous Q6H PRN Kris Hartmann, NP      ? phenylephrine (NEOSYNEPHRINE) 20-0.9 MG/250ML-% infusion           ? phenylephrine 80 mcg/10 mL injection           ? ? ?Past Medical History:  ?Diagnosis Date  ? Anxiety   ? BPH (benign prostatic hyperplasia)   ? Carotid artery disease ([place])   ?  s/p left carotid endarterectomy at [facility] in 2014  ? Carotid stenosis   ? bilateral  ? CKD (chronic kidney disease)   ? Elevated PSA   ? Fracture, thoracic vertebra ([place]) 05/06/2018  ? T3,T9 endplate fracture  ? Hemothorax on left 05/06/2018  ? HLD (hyperlipidemia)   ? HTN (hypertension)   ? Hyperlipidemia   ? Hypertension   ? Insomnia   ? Rib fractures 05/06/2018  ? Weak urinary stream   ? ? ?Past Surgical History:  ?Procedure Laterality Date  ? APPENDECTOMY    ? carotid stenosis Left   ? ces and stent placement  ? ? ? ?Social History  ? ?Tobacco Use  ? Smoking status: Former  ? Smokeless tobacco: Never  ? Tobacco comments:  ?  quit 40 years ago  ?Substance Use Topics  ? Alcohol use: Yes  ?  Alcohol/week: 4.0 standard drinks  ?  Types: 4 Cans of beer per week  ?  Comment:  occasional 4 cans of beer/wkly  ? Drug use: Never  ? ? ? ?Family History  ?Problem Relation Age of Onset  ? Stroke Father   ? CVA Father   ? Diabetes Mellitus II Father   ? Prostate cancer Neg Hx   ? Kidney disease Neg Hx   ? Kidney cancer Neg Hx   ? Bladder Cancer Neg Hx   ? ? ?Allergies  ?Allergen Reactions  ? Metoprolol   ?  Other reaction(s): DROWSINESS  ? Metoprolol Tartrate   ?  Other reaction(s): Other (See Comments)  ? ? ? ?REVIEW OF SYSTEMS (Negative unless checked) ? ?Constitutional: '[]'$ Weight loss  '[]'$ Fever  '[]'$ Chills ?Cardiac: '[]'$ Chest pain   '[]'$ Chest pressure   '[]'$ Palpitations   '[]'$ Shortness of breath when laying flat   '[]'$ Shortness of breath at rest   '[]'$ Shortness of breath with exertion. ?Vascular:  '[]'$ Pain in legs with walking   '[]'$ Pain in legs at rest   '[]'$ Pain in legs when laying flat   '[]'$ Claudication   '[]'$ Pain in feet when walking  '[]'$ Pain in feet at rest  '[]'$ Pain in feet when laying flat   '[]'$ History of DVT   '[]'$ Phlebitis   '[]'$ Swelling in legs   '[]'$ Varicose veins   '[]'$ Non-healing ulcers ?Pulmonary:   '[]'$ Uses home oxygen   '[]'$ Productive cough   '[]'$ Hemoptysis   '[]'$ Wheeze  '[]'$ COPD   '[]'$ Asthma ?Neurologic:  '[]'$ Dizziness  '[]'$ Blackouts   '[]'$ Seizures   '[]'$ History of  stroke   '[]'$ History of TIA  '[]'$ Aphasia   '[]'$ Temporary blindness   '[]'$ Dysphagia   '[]'$ Weakness or numbness in arms   '[]'$ Weakness or numbness in legs ?Musculoskeletal:  '[x]'$ Arthritis   '[]'$ Joint swelling   '[x]'$ Joint pain   '[]'$ Low back pain ?Hematologic:  '[]'$ Easy bruising  '[]'$ Easy bleeding   '[]'$ Hypercoagulable state   '[]'$ Anemic  '[]'$ Hepatitis ?Gastrointestinal:  '[]'$ Blood in stool   '[]'$ Vomiting blood  '[]'$ Gastroesophageal reflux/heartburn   '[]'$ Difficulty swallowing. ?Genitourinary:  '[x]'$ Chronic kidney disease   '[]'$ Difficult urination  '[x]'$ Frequent urination  '[]'$ Burning with urination   '[]'$ Blood in urine ?Skin:  '[]'$ Rashes   '[]'$ Ulcers   '[]'$ Wounds ?Psychological:  '[]'$ History of anxiety   '[]'$  History of major depression. ? ?Physical Examination ? ?Vitals:  ? 02/26/22 0717  ?BP: (!) 151/72  ?Pulse: (!) 57  ?Resp: 14  ?Temp: 97.7 ?F (36.5 ?C)  ?TempSrc: Oral  ?SpO2: 98%  ?Weight: 68 kg  ?Height: 5' 7.5" (1.715 m)  ? ?Body mass index is 23.15 kg/m?. ?Gen: WD/WN, NAD ?Head: [place]/AT, No temporalis wasting.  ?Ear/Nose/Throat: Hearing grossly intact, nares w/o erythema or drainage, oropharynx w/o Erythema/Exudate,  ?Eyes: Conjunctiva clear, sclera non-icteric ?Neck: Trachea midline.  No JVD.  ?Pulmonary:  Good air movement, respirations not labored, no use of accessory muscles.  ?Cardiac: RRR, normal S1, S2. ?Vascular:  ?Vessel Right Left  ?Radial Palpable Palpable  ?    ? ?Musculoskeletal: M/S 5/5 throughout.  Extremities without ischemic changes.  No deformity or atrophy.  ?Neurologic: Sensation grossly intact in extremities.  Symmetrical.  Speech is fluent. Motor exam as listed above. ?Psychiatric: Judgment intact, Mood & affect appropriate for pt's clinical situation. ?Dermatologic: No rashes or ulcers noted.  No cellulitis or open wounds. ? ? ? ? ? ?CBC ?Lab Results  ?Component Value Date  ? WBC 7.5 04/25/2018  ? HGB 10.5 (L) 04/25/2018  ? HCT 30.9 (L) 04/25/2018  ? MCV 94.8 04/25/2018  ? PLT 192 04/25/2018  ? ? ?BMET ?   ?Component Value Date/Time  ? NA 137  04/25/2018 0255  ? NA 139 08/11/2013  0406  ? K 4.0 04/25/2018 0255  ? K 4.2 08/11/2013 0406  ? CL 103 04/25/2018 0255  ? CL 107 08/11/2013 0406  ? CO2 27 04/25/2018 0255  ? CO2 29 08/11/2013 0406  ? GLUCOSE 133 (H) 04/25/2018 0255  ? GLUCOSE 101 (H) 08/11/2013 0406  ? BUN 46 (H) 02/26/2022 0724  ? BUN 18 08/11/2013 0406  ? CREATININE 1.85 (H) 02/26/2022 0724  ? CREATININE 1.43 (H) 08/11/2013 0406  ? CALCIUM 8.6 (L) 04/25/2018 0255  ? CALCIUM 8.7 08/11/2013 0406  ? GFRNONAA 36 (L) 02/26/2022 0724  ? GFRNONAA 49 (L) 08/11/2013 0406  ? GFRAA >60 04/25/2018 0255  ? GFRAA 56 (L) 08/11/2013 0406  ? ?Estimated Creatinine Clearance: 30.3 mL/min (A) (by C-G formula based on SCr of 1.85 mg/dL (H)). ? ?COAG ?Lab Results  ?Component Value Date  ? INR 1.06 04/16/2018  ? INR 1.1 08/11/2013  ? ? ?Radiology ?No results found. ? ? ?Assessment/Plan ?Hypertension ?blood pressure control important in reducing the progression of atherosclerotic disease. On appropriate oral medications. No changes ?  ?  ?CKD (chronic kidney disease) stage 3, GFR 30-59 ml/min ?Angiogram instead of CT due to contrast limitations ?  ?Hyperlipidemia ?lipid control important in reducing the progression of atherosclerotic disease. Continue statin therapy ?  ?Bilateral carotid artery stenosis ?Duplex shows a marked progression in his velocities in the right internal carotid artery now consistent with an 80 to 99% stenosis.  His left carotid endarterectomy site remains patent without significant recurrent stenosis.  We had a long discussion today regarding what this means.  We discussed the pathophysiology and natural history of carotid disease.  A high-grade lesion would put him at a significantly increased risk of stroke.  His chronic kidney disease likely precludes CT angiogram.  At this point, a carotid angiogram is the prudent option to further evaluate the degree of stenosis.  If his anatomy is amenable for carotid stent placement, it would be recommended  to go ahead and proceed with right carotid artery stenting at that time.  He had left carotid stent placement about 8 years ago and it has done well.  If his anatomy is not amenable but high-grade disea

## 2022-02-27 LAB — CBC
HCT: 29 % — ABNORMAL LOW (ref 39.0–52.0)
Hemoglobin: 9.7 g/dL — ABNORMAL LOW (ref 13.0–17.0)
MCH: 31.9 pg (ref 26.0–34.0)
MCHC: 33.4 g/dL (ref 30.0–36.0)
MCV: 95.4 fL (ref 80.0–100.0)
Platelets: 113 10*3/uL — ABNORMAL LOW (ref 150–400)
RBC: 3.04 MIL/uL — ABNORMAL LOW (ref 4.22–5.81)
RDW: 11.9 % (ref 11.5–15.5)
WBC: 7.3 10*3/uL (ref 4.0–10.5)
nRBC: 0 % (ref 0.0–0.2)

## 2022-02-27 LAB — BASIC METABOLIC PANEL
Anion gap: 2 — ABNORMAL LOW (ref 5–15)
BUN: 40 mg/dL — ABNORMAL HIGH (ref 8–23)
CO2: 23 mmol/L (ref 22–32)
Calcium: 8 mg/dL — ABNORMAL LOW (ref 8.9–10.3)
Chloride: 112 mmol/L — ABNORMAL HIGH (ref 98–111)
Creatinine, Ser: 2.25 mg/dL — ABNORMAL HIGH (ref 0.61–1.24)
GFR, Estimated: 29 mL/min — ABNORMAL LOW (ref >60)
Glucose, Bld: 111 mg/dL — ABNORMAL HIGH (ref 70–99)
Potassium: 4.6 mmol/L (ref 3.5–5.1)
Sodium: 137 mmol/L (ref 135–145)

## 2022-02-27 MED ORDER — ASPIRIN 81 MG PO TBEC: 81.0000 mg | DELAYED_RELEASE_TABLET | Freq: Every day | ORAL | 11 refills | Status: DC

## 2022-02-27 MED ORDER — CLOPIDOGREL BISULFATE 75 MG PO TABS: 75.0000 mg | ORAL_TABLET | Freq: Every day | ORAL | 11 refills | Status: AC

## 2022-02-27 MED ORDER — FAMOTIDINE IN NACL 20-0.9 MG/50ML-% IV SOLN: 20.0000 mg | INTRAVENOUS | Status: DC

## 2022-02-27 NOTE — Discharge Summary (Signed)
? ?[facility]    ?Discharge Summary ? ? ? ?Patient ID:  ?Xavier Davis ?MRN: [id number] ?DOB/AGE: Dec 24, 1940 81 y.o. ? ?Admit date: 02/26/2022 ?Discharge date: 02/27/2022 ?Date of Surgery: 02/26/2022 ?Surgeon: Surgeon(s): ?Algernon Huxley, MD ? ?Admission Diagnosis: ?Carotid stenosis, right [I65.21] ? ?Discharge Diagnoses:  ?Carotid stenosis, right [I65.21] ? ?Secondary Diagnoses: ?Past Medical History:  ?Diagnosis Date  ? Anxiety   ? Atrial fibrillation ([place])   ? BPH (benign prostatic hyperplasia)   ? Carotid artery disease ([place])   ? s/p left carotid endarterectomy at [facility] in 2014  ? Carotid stenosis   ? bilateral  ? CKD (chronic kidney disease)   ? Elevated PSA   ? Fracture, thoracic vertebra ([place]) 05/06/2018  ? T3,T9 endplate fracture  ? Hemothorax on left 05/06/2018  ? HLD (hyperlipidemia)   ? HTN (hypertension)   ? Hyperlipidemia   ? Hypertension   ? Insomnia   ? Rib fractures 05/06/2018  ? Weak urinary stream   ? ? ?Procedure(s): ?CAROTID PTA/STENT INTERVENTION ? ?Discharged Condition: good ? ?HPI:  ?Xavier Davis is an 81 year old male that presented on 02/26/2022 for placement of a right ICA stent.  He has previously had a left carotid endarterectomy with stent due to recurrent stenosis.  Prior to intervention the patient had a 90% calcified right carotid artery stenosis with extension up to the angle of the jaw.  The patient has done well post intervention.  He had some discomfort in the right neck area with some bleeding from his PT device however this morning there is no bleeding and his blood pressure has been stable. ? ?Hospital Course:  ?Xavier Davis is a 81 y.o. male is S/P Right ICA stent placement  ?Procedure(s): ?CAROTID PTA/STENT INTERVENTION ?Extubated: POD # 0 ?Physical exam: No focal neurological deficits, clean dry and soft groin, 2+ DP pulses bilaterally ?Post-op wounds clean, dry, intact or healing well ?Pt. Ambulating, voiding and taking PO diet without difficulty. ?Pt  pain controlled with PO pain meds. ?Labs as below ?Complications:none ? ?Consults:  ? ? ?Significant Diagnostic Studies: ?CBC ?Lab Results  ?Component Value Date  ? WBC 7.3 02/27/2022  ? HGB 9.7 (L) 02/27/2022  ? HCT 29.0 (L) 02/27/2022  ? MCV 95.4 02/27/2022  ? PLT 113 (L) 02/27/2022  ? ? ?BMET ?   ?Component Value Date/Time  ? NA 137 02/27/2022 0525  ? NA 139 08/11/2013 0406  ? K 4.6 02/27/2022 0525  ? K 4.2 08/11/2013 0406  ? CL 112 (H) 02/27/2022 0525  ? CL 107 08/11/2013 0406  ? CO2 23 02/27/2022 0525  ? CO2 29 08/11/2013 0406  ? GLUCOSE 111 (H) 02/27/2022 0525  ? GLUCOSE 101 (H) 08/11/2013 0406  ? BUN 40 (H) 02/27/2022 0525  ? BUN 18 08/11/2013 0406  ? CREATININE 2.25 (H) 02/27/2022 0525  ? CREATININE 1.43 (H) 08/11/2013 0406  ? CALCIUM 8.0 (L) 02/27/2022 0525  ? CALCIUM 8.7 08/11/2013 0406  ? GFRNONAA 29 (L) 02/27/2022 0525  ? GFRNONAA 49 (L) 08/11/2013 0406  ? GFRAA >60 04/25/2018 0255  ? GFRAA 56 (L) 08/11/2013 0406  ? ?COAG ?Lab Results  ?Component Value Date  ? INR 1.06 04/16/2018  ? INR 1.1 08/11/2013  ? ? ? ?Disposition:  ?Discharge to :Home ? ?Allergies as of 02/27/2022   ? ?   Reactions  ? Metoprolol   ? Other reaction(s): DROWSINESS  ? Metoprolol Tartrate   ? Other reaction(s): Other (See Comments)  ? ?  ? ?  ?  Medication List  ?  ? ?TAKE these medications   ? ?amLODipine 10 MG tablet ?Commonly known as: NORVASC ?Take 10 mg by mouth daily. ?  ?aspirin 81 MG EC tablet ?Take 1 tablet (81 mg total) by mouth daily at 6 (six) AM. Swallow whole. ?Start taking on: February 28, 2022 ?  ?carvedilol 3.125 MG tablet ?Commonly known as: COREG ?Take 3.125 mg by mouth 2 (two) times daily with a meal. ?  ?citalopram 40 MG tablet ?Commonly known as: CELEXA ?Take by mouth. ?  ?clopidogrel 75 MG tablet ?Commonly known as: PLAVIX ?Take 1 tablet (75 mg total) by mouth daily at 6 (six) AM. ?Start taking on: February 28, 2022 ?  ?finasteride 5 MG tablet ?Commonly known as: PROSCAR ?TAKE 1 TABLET BY MOUTH DAILY ?  ?lisinopril 40  MG tablet ?Commonly known as: ZESTRIL ?Take 40 mg by mouth daily. ?  ?lovastatin 20 MG tablet ?Commonly known as: MEVACOR ?Take 20 mg by mouth at bedtime. ?  ?sildenafil 100 MG tablet ?Commonly known as: Viagra ?Take 1 tablet (100 mg total) by mouth daily as needed for erectile dysfunction. ?  ?tamsulosin 0.4 MG Caps capsule ?Commonly known as: FLOMAX ?Take 1 capsule (0.4 mg total) by mouth daily. ?  ?traZODone 50 MG tablet ?Commonly known as: DESYREL ?Take 50 mg by mouth at bedtime. ?  ? ?  ? ?Verbal and written Discharge instructions given to the patient. Wound care per Discharge AVS ? ? ?Signed: ?Kris Hartmann, NP ? ?02/27/2022, 10:42 AM ? ?  ?

## 2022-02-27 NOTE — Progress Notes (Signed)
PHARMACY NOTE:  RENAL DOSAGE ADJUSTMENT ? ?Current regimen includes a mismatch between dosage and estimated renal function.  As per policy approved by the Pharmacy & Therapeutics and Medical Executive Committees, the drug dosage will be adjusted accordingly. ? ?Current dosage:  famotidine 20 mg IV q12h ? ?Renal Function: ? ?Estimated Creatinine Clearance: 24.9 mL/min (A) (by C-G formula based on SCr of 2.25 mg/dL (H)). ?'[]'$      On intermittent HD, scheduled: ?'[]'$      On CRRT ?   ?dosage has been changed to:  famotidine 20 mg IV q24h ? ? ?Thank you for allowing pharmacy to be a part of this patient's care. ? ?Dallie Piles, RPH ?02/27/2022 8:18 AM ? ? ?  ? ?

## 2022-02-27 NOTE — Progress Notes (Signed)
Patient provided discharge instructions and AVS. Wound care instructions provided to patient and wife; all questions answered. Signs of infection explained. Pain management reviewed. Pt wheeled to transport (wife) by volunteer. ?

## 2022-02-28 ENCOUNTER — Other Ambulatory Visit: Payer: Self-pay

## 2022-02-28 ENCOUNTER — Emergency Department: Payer: Medicare Other

## 2022-02-28 ENCOUNTER — Inpatient Hospital Stay
Admission: EM | Admit: 2022-02-28 | Discharge: 2022-03-03 | DRG: 308 | Disposition: A | Discharge status: Home Health | Payer: Medicare Other | Attending: Family Medicine | Admitting: Family Medicine

## 2022-02-28 ENCOUNTER — Encounter: Payer: Self-pay | Admitting: Emergency Medicine

## 2022-02-28 DIAGNOSIS — N400 Enlarged prostate without lower urinary tract symptoms: Secondary | ICD-10-CM | POA: Diagnosis present

## 2022-02-28 DIAGNOSIS — H9190 Unspecified hearing loss, unspecified ear: Secondary | ICD-10-CM | POA: Diagnosis present

## 2022-02-28 DIAGNOSIS — Z888 Allergy status to other drugs, medicaments and biological substances status: Secondary | ICD-10-CM

## 2022-02-28 DIAGNOSIS — I739 Peripheral vascular disease, unspecified: Secondary | ICD-10-CM | POA: Diagnosis present

## 2022-02-28 DIAGNOSIS — N183 Chronic kidney disease, stage 3 unspecified: Secondary | ICD-10-CM | POA: Diagnosis present

## 2022-02-28 DIAGNOSIS — Z20822 Contact with and (suspected) exposure to covid-19: Secondary | ICD-10-CM | POA: Diagnosis present

## 2022-02-28 DIAGNOSIS — F419 Anxiety disorder, unspecified: Secondary | ICD-10-CM | POA: Diagnosis present

## 2022-02-28 DIAGNOSIS — I130 Hypertensive heart and chronic kidney disease with heart failure and stage 1 through stage 4 chronic kidney disease, or unspecified chronic kidney disease: Secondary | ICD-10-CM | POA: Diagnosis present

## 2022-02-28 DIAGNOSIS — I248 Other forms of acute ischemic heart disease: Secondary | ICD-10-CM | POA: Diagnosis present

## 2022-02-28 DIAGNOSIS — Z7902 Long term (current) use of antithrombotics/antiplatelets: Secondary | ICD-10-CM | POA: Diagnosis not present

## 2022-02-28 DIAGNOSIS — Z7982 Long term (current) use of aspirin: Secondary | ICD-10-CM

## 2022-02-28 DIAGNOSIS — I6523 Occlusion and stenosis of bilateral carotid arteries: Secondary | ICD-10-CM | POA: Diagnosis present

## 2022-02-28 DIAGNOSIS — I5033 Acute on chronic diastolic (congestive) heart failure: Secondary | ICD-10-CM | POA: Diagnosis present

## 2022-02-28 DIAGNOSIS — E785 Hyperlipidemia, unspecified: Secondary | ICD-10-CM | POA: Diagnosis present

## 2022-02-28 DIAGNOSIS — Z79899 Other long term (current) drug therapy: Secondary | ICD-10-CM

## 2022-02-28 DIAGNOSIS — I4510 Unspecified right bundle-branch block: Secondary | ICD-10-CM | POA: Diagnosis present

## 2022-02-28 DIAGNOSIS — I272 Pulmonary hypertension, unspecified: Secondary | ICD-10-CM | POA: Diagnosis present

## 2022-02-28 DIAGNOSIS — Z87891 Personal history of nicotine dependence: Secondary | ICD-10-CM | POA: Diagnosis not present

## 2022-02-28 DIAGNOSIS — Z833 Family history of diabetes mellitus: Secondary | ICD-10-CM | POA: Diagnosis not present

## 2022-02-28 DIAGNOSIS — Z823 Family history of stroke: Secondary | ICD-10-CM

## 2022-02-28 DIAGNOSIS — I214 Non-ST elevation (NSTEMI) myocardial infarction: Principal | ICD-10-CM | POA: Diagnosis present

## 2022-02-28 DIAGNOSIS — R0602 Shortness of breath: Secondary | ICD-10-CM

## 2022-02-28 DIAGNOSIS — I480 Paroxysmal atrial fibrillation: Principal | ICD-10-CM | POA: Diagnosis present

## 2022-02-28 DIAGNOSIS — I10 Essential (primary) hypertension: Secondary | ICD-10-CM | POA: Diagnosis present

## 2022-02-28 LAB — CBC
HCT: 32.4 % — ABNORMAL LOW (ref 39.0–52.0)
Hemoglobin: 10.9 g/dL — ABNORMAL LOW (ref 13.0–17.0)
MCH: 31.8 pg (ref 26.0–34.0)
MCHC: 33.6 g/dL (ref 30.0–36.0)
MCV: 94.5 fL (ref 80.0–100.0)
Platelets: 115 10*3/uL — ABNORMAL LOW (ref 150–400)
RBC: 3.43 MIL/uL — ABNORMAL LOW (ref 4.22–5.81)
RDW: 11.8 % (ref 11.5–15.5)
WBC: 11.4 10*3/uL — ABNORMAL HIGH (ref 4.0–10.5)
nRBC: 0 % (ref 0.0–0.2)

## 2022-02-28 LAB — BASIC METABOLIC PANEL
Anion gap: 6 (ref 5–15)
BUN: 38 mg/dL — ABNORMAL HIGH (ref 8–23)
CO2: 23 mmol/L (ref 22–32)
Calcium: 8.9 mg/dL (ref 8.9–10.3)
Chloride: 107 mmol/L (ref 98–111)
Creatinine, Ser: 1.92 mg/dL — ABNORMAL HIGH (ref 0.61–1.24)
GFR, Estimated: 35 mL/min — ABNORMAL LOW (ref >60)
Glucose, Bld: 150 mg/dL — ABNORMAL HIGH (ref 70–99)
Potassium: 4.6 mmol/L (ref 3.5–5.1)
Sodium: 136 mmol/L (ref 135–145)

## 2022-02-28 LAB — RESP PANEL BY RT-PCR (FLU A&B, COVID) ARPGX2
Influenza A by PCR: NEGATIVE
Influenza B by PCR: NEGATIVE
SARS Coronavirus 2 by RT PCR: NEGATIVE

## 2022-02-28 LAB — PROTIME-INR
INR: 1.1 (ref 0.8–1.2)
Prothrombin Time: 14.2 seconds (ref 11.4–15.2)

## 2022-02-28 LAB — BRAIN NATRIURETIC PEPTIDE: B Natriuretic Peptide: 1303.1 pg/mL — ABNORMAL HIGH (ref 0.0–100.0)

## 2022-02-28 LAB — TROPONIN I (HIGH SENSITIVITY)
Troponin I (High Sensitivity): 577 ng/L (ref <18)
Troponin I (High Sensitivity): 565 ng/L (ref <18)

## 2022-02-28 LAB — APTT: aPTT: 32 seconds (ref 24–36)

## 2022-02-28 MED ORDER — LISINOPRIL 10 MG PO TABS: 40.0000 mg | ORAL_TABLET | Freq: Every day | ORAL | Status: DC

## 2022-02-28 MED ORDER — CLOPIDOGREL BISULFATE 75 MG PO TABS
75.0000 mg | ORAL_TABLET | Freq: Every day | ORAL | Status: DC
Start: 2022-03-01 — End: 2022-03-03
  Administered 2022-03-01 – 2022-03-03 (×3): 75 mg via ORAL
  Filled 2022-02-28 (×3): qty 1

## 2022-02-28 MED ORDER — PRAVASTATIN SODIUM 20 MG PO TABS: 40.0000 mg | ORAL_TABLET | Freq: Every day | ORAL | Status: DC

## 2022-02-28 MED ORDER — TRAZODONE HCL 50 MG PO TABS
50.0000 mg | ORAL_TABLET | Freq: Every day | ORAL | Status: DC
Start: 2022-02-28 — End: 2022-03-03
  Administered 2022-02-28 – 2022-03-02 (×3): 50 mg via ORAL
  Filled 2022-02-28 (×2): qty 1

## 2022-02-28 MED ORDER — SILDENAFIL CITRATE 20 MG PO TABS: 100.0000 mg | ORAL_TABLET | Freq: Every day | ORAL | Status: DC | PRN

## 2022-02-28 MED ORDER — CARVEDILOL 6.25 MG PO TABS
3.1250 mg | ORAL_TABLET | Freq: Two times a day (BID) | ORAL | Status: DC
  Administered 2022-03-01: 3.125 mg via ORAL
  Filled 2022-02-28: qty 1

## 2022-02-28 MED ORDER — CITALOPRAM HYDROBROMIDE 20 MG PO TABS
40.0000 mg | ORAL_TABLET | Freq: Every day | ORAL | Status: DC
  Administered 2022-03-01 – 2022-03-02 (×2): 40 mg via ORAL
  Filled 2022-02-28 (×2): qty 2

## 2022-02-28 MED ORDER — HEPARIN BOLUS VIA INFUSION
4000.0000 [IU] | Freq: Once | INTRAVENOUS | Status: AC
  Administered 2022-02-28: 4000 [IU] via INTRAVENOUS
  Filled 2022-02-28: qty 4000

## 2022-02-28 MED ORDER — HEPARIN (PORCINE) 25000 UT/250ML-% IV SOLN
1300.0000 [IU]/h | INTRAVENOUS | Status: DC
  Administered 2022-02-28: 800 [IU]/h via INTRAVENOUS
  Administered 2022-03-01: 1050 [IU]/h via INTRAVENOUS
  Filled 2022-02-28 (×2): qty 250

## 2022-02-28 MED ORDER — FUROSEMIDE 10 MG/ML IJ SOLN
20.0000 mg | Freq: Once | INTRAMUSCULAR | Status: AC
  Administered 2022-02-28: 20 mg via INTRAVENOUS
  Filled 2022-02-28: qty 4

## 2022-02-28 MED ORDER — NITROGLYCERIN 0.4 MG SL SUBL: 0.4000 mg | SUBLINGUAL_TABLET | SUBLINGUAL | Status: DC | PRN

## 2022-02-28 MED ORDER — HEPARIN SODIUM (PORCINE) 5000 UNIT/ML IJ SOLN
60.0000 [IU]/kg | Freq: Once | INTRAMUSCULAR | Status: DC
Start: 2022-02-28 — End: 2022-02-28

## 2022-02-28 MED ORDER — ACETAMINOPHEN 325 MG PO TABS: 650.0000 mg | ORAL_TABLET | ORAL | Status: DC | PRN

## 2022-02-28 MED ORDER — HYDRALAZINE HCL 20 MG/ML IJ SOLN
10.0000 mg | Freq: Four times a day (QID) | INTRAMUSCULAR | Status: DC | PRN
Start: 2022-02-28 — End: 2022-03-03

## 2022-02-28 MED ORDER — TAMSULOSIN HCL 0.4 MG PO CAPS
0.4000 mg | ORAL_CAPSULE | Freq: Every day | ORAL | Status: DC
  Administered 2022-03-01 – 2022-03-03 (×3): 0.4 mg via ORAL
  Filled 2022-02-28 (×3): qty 1

## 2022-02-28 MED ORDER — FINASTERIDE 5 MG PO TABS
5.0000 mg | ORAL_TABLET | Freq: Every day | ORAL | Status: DC
  Administered 2022-03-01 – 2022-03-03 (×3): 5 mg via ORAL
  Filled 2022-02-28 (×3): qty 1

## 2022-02-28 MED ORDER — AMLODIPINE BESYLATE 10 MG PO TABS
10.0000 mg | ORAL_TABLET | Freq: Every day | ORAL | Status: DC
Start: 2022-03-01 — End: 2022-03-03
  Administered 2022-03-01 – 2022-03-03 (×3): 10 mg via ORAL
  Filled 2022-02-28: qty 1
  Filled 2022-02-28: qty 2
  Filled 2022-02-28: qty 1

## 2022-02-28 MED ORDER — MORPHINE SULFATE (PF) 2 MG/ML IV SOLN: 1.0000 mg | INTRAVENOUS | Status: DC | PRN

## 2022-02-28 MED ORDER — ONDANSETRON HCL 4 MG/2ML IJ SOLN: 4.0000 mg | Freq: Four times a day (QID) | INTRAMUSCULAR | Status: DC | PRN

## 2022-02-28 MED ORDER — ASPIRIN 81 MG PO CHEW
243.0000 mg | CHEWABLE_TABLET | Freq: Once | ORAL | Status: AC
  Administered 2022-02-28: 243 mg via ORAL
  Filled 2022-02-28: qty 3

## 2022-02-28 MED ORDER — ASPIRIN EC 81 MG PO TBEC
81.0000 mg | DELAYED_RELEASE_TABLET | Freq: Every day | ORAL | Status: DC
  Administered 2022-03-01 – 2022-03-03 (×3): 81 mg via ORAL
  Filled 2022-02-28 (×3): qty 1

## 2022-02-28 MED ORDER — ROSUVASTATIN CALCIUM 10 MG PO TABS
40.0000 mg | ORAL_TABLET | Freq: Every day | ORAL | Status: DC
  Administered 2022-03-01 – 2022-03-02 (×2): 40 mg via ORAL
  Filled 2022-02-28: qty 2
  Filled 2022-02-28: qty 4

## 2022-02-28 NOTE — ED Triage Notes (Signed)
Pt to ED from home c/o SOB and cough this afternoon.  Denies pain, states dyspnea with exertion.  Chills earlier.  Pt states had two stents placed in right carotid artery on Monday and started on plavix and baby ASA and took first dose this morning.  Pt A&Ox4, chest rise even and unlabored, skin WNL and in NAD at this time. ?

## 2022-02-28 NOTE — H&P (Signed)
?History and Physical  ? ? ?Patient: Xavier Davis VEL:[id number] DOB: 07/27/1941 ?DOA: 02/28/2022 ?DOS: the patient was seen and examined on 02/28/2022 ?PCP: Tracie Harrier, MD  ?Patient coming from: Home ?Cardiology: Dr.End.  ? ?Chief Complaint:  ?Chief Complaint  ?Patient presents with  ? Shortness of Breath  ? ?HPI: Xavier Davis is a 81 y.o. male with medical history significant of anxiety, atrial fibrillation, BPH, carotid artery disease status post stents elevated PSA, hypertension, hyperlipidemia.  Patient was discharged yesterday after receiving a right ICA stent on 02/22/2022 for recurrent stenosis.  Patient tolerated procedure well and is getting blood pressure improvement. ?Pt states he was sitting and started to have SOB at 2 pm on afternoon. ?Lasted till now.  ?Pt also had a cough and it was clear sputum.  ?Sob was constant.  ?8/10. ?No A/A factors. ?He rested. ?Pt drove himself. ?Pt didn't want to come.  ?Wife at bedside.  ?Pt has been taking coricidin.  ?Wife drove him to ED.  ?Carotid artery stent placed Monday and d/c yesterday.  ?Pt sees dr. Lucky Davis.  ? ? ?Review of Systems: Review of Systems  ?Respiratory:  Positive for cough and shortness of breath.   ?Cardiovascular:  Negative for chest pain, palpitations and leg swelling.  ?All other systems reviewed and are negative. ? ?Past Medical History:  ?Diagnosis Date  ? Anxiety   ? Atrial fibrillation ([place])   ? BPH (benign prostatic hyperplasia)   ? Carotid artery disease ([place])   ? s/p left carotid endarterectomy at [facility] in 2014  ? Carotid stenosis   ? bilateral  ? CKD (chronic kidney disease)   ? Elevated PSA   ? Fracture, thoracic vertebra ([place]) 05/06/2018  ? T3,T9 endplate fracture  ? Hemothorax on left 05/06/2018  ? HLD (hyperlipidemia)   ? HTN (hypertension)   ? Hyperlipidemia   ? Hypertension   ? Insomnia   ? Rib fractures 05/06/2018  ? Weak urinary stream   ? ?Past Surgical History:  ?Procedure Laterality Date  ? APPENDECTOMY    ? CAROTID  PTA/STENT INTERVENTION Right 02/26/2022  ? Procedure: CAROTID PTA/STENT INTERVENTION;  Surgeon: Algernon Huxley, MD;  Location: [place] [facility];  Service: Cardiovascular;  Laterality: Right;  ? carotid stenosis Left   ? ces and stent placement  ? cataract surgery Bilateral   ? ?Social History:  reports that he has quit smoking. He has never used smokeless tobacco. He reports current alcohol use of about 4.0 standard drinks per week. He reports that he does not use drugs. ? ?Allergies  ?Allergen Reactions  ? Metoprolol   ?  Other reaction(s): DROWSINESS  ? Metoprolol Tartrate   ?  Other reaction(s): Other (See Comments)  ? ? ?Family History  ?Problem Relation Age of Onset  ? Stroke Father   ? CVA Father   ? Diabetes Mellitus II Father   ? Prostate cancer Neg Hx   ? Kidney disease Neg Hx   ? Kidney cancer Neg Hx   ? Bladder Cancer Neg Hx   ? ? ?Prior to Admission medications   ?Medication Sig Start Date End Date Taking? Authorizing Provider  ?amLODipine (NORVASC) 10 MG tablet Take 10 mg by mouth daily. 05/12/21  Yes [provider]  ?aspirin EC 81 MG EC tablet Take 1 tablet (81 mg total) by mouth daily at 6 (six) AM. Swallow whole. 02/28/22  Yes Kris Hartmann, NP  ?carvedilol (COREG) 3.125 MG tablet Take 3.125 mg by mouth 2 (two)  times daily with a meal.   Yes [provider]  ?citalopram (CELEXA) 40 MG tablet Take 40 mg by mouth daily. 12/20/14  Yes [provider]  ?clopidogrel (PLAVIX) 75 MG tablet Take 1 tablet (75 mg total) by mouth daily at 6 (six) AM. 02/28/22  Yes Kris Hartmann, NP  ?finasteride (PROSCAR) 5 MG tablet TAKE 1 TABLET BY MOUTH DAILY 07/05/21  Yes McGowan, Larene Beach A, PA-C  ?lisinopril (ZESTRIL) 40 MG tablet Take 40 mg by mouth daily. 06/06/20  Yes [provider]  ?lovastatin (MEVACOR) 20 MG tablet Take 20 mg by mouth at bedtime.   Yes [provider]  ?tamsulosin (FLOMAX) 0.4 MG CAPS capsule Take 1 capsule (0.4 mg total) by mouth daily. 08/03/21  Yes  McGowan, Larene Beach A, PA-C  ?traZODone (DESYREL) 50 MG tablet Take 50 mg by mouth at bedtime.   Yes [provider]  ?sildenafil (VIAGRA) 100 MG tablet Take 1 tablet (100 mg total) by mouth daily as needed for erectile dysfunction. 11/22/21   Nori Riis, PA-C  ? ? ?Physical Exam: ?Vitals:  ? 02/28/22 2032 02/28/22 2039 02/28/22 2130 02/28/22 2141  ?BP:  (!) 161/63 (!) 160/64   ?Pulse:  71 76   ?Resp:  18 15   ?Temp:  98.8 ?F (37.1 ?C)    ?TempSrc:  Oral    ?SpO2:  92% 95%   ?Weight: 68 kg   64.8 kg  ?Height: '5\' 7"'$  (1.702 m)     ?Physical Exam ?Vitals and nursing note reviewed.  ?Constitutional:   ?   General: He is not in acute distress. ?   Appearance: Normal appearance. He is not ill-appearing, toxic-appearing or diaphoretic.  ?HENT:  ?   Head: Normocephalic and atraumatic.  ?   Right Ear: Hearing and external ear normal.  ?   Left Ear: Hearing and external ear normal.  ?   Nose: Nose normal. No nasal deformity.  ?   Mouth/Throat:  ?   Lips: Pink.  ?   Mouth: Mucous membranes are moist.  ?   Tongue: No lesions.  ?   Pharynx: Oropharynx is clear.  ?Eyes:  ?   Extraocular Movements: Extraocular movements intact.  ?   Pupils: Pupils are equal, round, and reactive to light.  ?Neck:  ?   Vascular: No carotid bruit.  ?Cardiovascular:  ?   Rate and Rhythm: Normal rate and regular rhythm.  ?   Pulses: Normal pulses.  ?   Heart sounds: Normal heart sounds.  ?Pulmonary:  ?   Effort: Pulmonary effort is normal.  ?   Breath sounds: Normal breath sounds.  ?Abdominal:  ?   General: Bowel sounds are normal. There is no distension.  ?   Palpations: Abdomen is soft. There is no mass.  ?   Tenderness: There is no abdominal tenderness. There is no guarding.  ?   Hernia: No hernia is present.  ?Musculoskeletal:  ?   Right lower leg: No edema.  ?   Left lower leg: No edema.  ?Skin: ?   General: Skin is warm.  ?Neurological:  ?   General: No focal deficit present.  ?   Mental Status: He is alert and oriented to person,  place, and time.  ?   Cranial Nerves: Cranial nerves 2-12 are intact.  ?   Motor: Motor function is intact.  ?Psychiatric:     ?   Attention and Perception: Attention normal.     ?   Mood  and Affect: Mood normal.     ?   Speech: Speech normal.     ?   Behavior: Behavior normal. Behavior is cooperative.     ?   Cognition and Memory: Cognition normal.  ? ? ? ?Data Reviewed: ?Results for orders placed or performed during the hospital encounter of 02/28/22 (from the past 24 hour(s))  ?Basic metabolic panel     Status: Abnormal  ? Collection Time: 02/28/22  8:41 PM  ?Result Value Ref Range  ? Sodium 136 135 - 145 mmol/L  ? Potassium 4.6 3.5 - 5.1 mmol/L  ? Chloride 107 98 - 111 mmol/L  ? CO2 23 22 - 32 mmol/L  ? Glucose, Bld 150 (H) 70 - 99 mg/dL  ? BUN 38 (H) 8 - 23 mg/dL  ? Creatinine, Ser 1.92 (H) 0.61 - 1.24 mg/dL  ? Calcium 8.9 8.9 - 10.3 mg/dL  ? GFR, Estimated 35 (L) >60 mL/min  ? Anion gap 6 5 - 15  ?CBC     Status: Abnormal  ? Collection Time: 02/28/22  8:41 PM  ?Result Value Ref Range  ? WBC 11.4 (H) 4.0 - 10.5 K/uL  ? RBC 3.43 (L) 4.22 - 5.81 MIL/uL  ? Hemoglobin 10.9 (L) 13.0 - 17.0 g/dL  ? HCT 32.4 (L) 39.0 - 52.0 %  ? MCV 94.5 80.0 - 100.0 fL  ? MCH 31.8 26.0 - 34.0 pg  ? MCHC 33.6 30.0 - 36.0 g/dL  ? RDW 11.8 11.5 - 15.5 %  ? Platelets 115 (L) 150 - 400 K/uL  ? nRBC 0.0 0.0 - 0.2 %  ?Troponin I (High Sensitivity)     Status: Abnormal  ? Collection Time: 02/28/22  8:41 PM  ?Result Value Ref Range  ? Troponin I (High Sensitivity) 577 (HH) <18 ng/L  ?Protime-INR (order if Patient is taking Coumadin / Warfarin)     Status: None  ? Collection Time: 02/28/22  8:41 PM  ?Result Value Ref Range  ? Prothrombin Time 14.2 11.4 - 15.2 seconds  ? INR 1.1 0.8 - 1.2  ?Brain natriuretic peptide     Status: Abnormal  ? Collection Time: 02/28/22  8:41 PM  ?Result Value Ref Range  ? B Natriuretic Peptide 1,303.1 (H) 0.0 - 100.0 pg/mL  ?APTT     Status: None  ? Collection Time: 02/28/22  8:49 PM  ?Result Value Ref Range  ?  aPTT 32 24 - 36 seconds  ?Resp Panel by RT-PCR (Flu A&B, Covid) Nasopharyngeal Swab     Status: None  ? Collection Time: 02/28/22  9:51 PM  ? Specimen: Nasopharyngeal Swab; Nasopharyngeal(NP) swabs in vial transport med

## 2022-02-28 NOTE — Assessment & Plan Note (Addendum)
Baseline creatinine around 1.9 - 2.2 ?Creatinine around baseline. ?Hold lisinopril. Continue gentle hydration. ?

## 2022-02-28 NOTE — Assessment & Plan Note (Addendum)
Patient remains in sinus rhythm. Patient started on heparin gtt. ?Continue Plavix and aspirin as per primary cardiologist. ?Coreg changed with metoprolol for heart rate control. ?2D echocardiogram showed LVEF 60 to 65%, no regional wall motion abnormalities. ?Anticoagulation changed to Eliquis. ?

## 2022-02-28 NOTE — ED Provider Notes (Signed)
? ?[facility] ?Provider Note ? ? ? Event Date/Time  ? First MD Initiated Contact with Patient 02/28/22 2125   ?  (approximate) ? ? ?History  ? ?Shortness of Breath ? ? ?HPI ? ?Xavier Davis is a 81 y.o. male who presents to the ED for evaluation of Shortness of Breath ?  ?I reviewed outpatient cardiology visit from 8 days ago.  History of paroxysmal atrial fibrillation not on anticoagulation.  HTN, HLD. ?2 days ago he had a right carotid artery stent placement due to 90% stenosis. ? ?Patient presents to the ED with his wife for evaluation of dyspnea, decreased exercise tolerance.  Typically and prior to his recent carotid stent, patient reports he will frequently walk 2 miles every morning without difficulty. ? ?Throughout the day today though, patient reports feeling significant dyspnea on exertion and inability to ambulate around his house as he typically does.  Reports feeling very weak and getting exhausted with short ambulations around his house.  Denies dyspnea at rest.  Denies any chest pain or pressure.  Denies syncopal episodes, cough or fever. ? ?Physical Exam  ? ?Triage Vital Signs: ?ED Triage Vitals  ?Enc Vitals Group  ?   BP 02/28/22 2039 (!) 161/63  ?   Pulse Rate 02/28/22 2039 71  ?   Resp 02/28/22 2039 18  ?   Temp 02/28/22 2039 98.8 ?F (37.1 ?C)  ?   Temp Source 02/28/22 2039 Oral  ?   SpO2 02/28/22 2039 92 %  ?   Weight 02/28/22 2032 150 lb (68 kg)  ?   Height 02/28/22 2032 '5\' 7"'$  (1.702 m)  ?   Head Circumference --   ?   Peak Flow --   ?   Pain Score 02/28/22 2032 0  ?   Pain Loc --   ?   Pain Edu? --   ?   Excl. in [place]? --   ? ? ?Most recent vital signs: ?Vitals:  ? 02/28/22 2039 02/28/22 2130  ?BP: (!) 161/63 (!) 160/64  ?Pulse: 71 76  ?Resp: 18 15  ?Temp: 98.8 ?F (37.1 ?C)   ?SpO2: 92% 95%  ? ? ?General: Awake, no distress.  Slightly hard of hearing.  Largely pleasant and conversational. ?CV:  Good peripheral perfusion. RRR ?Resp:  Normal effort.  ?Abd:  No distention.   Soft and benign.  Right femoral access site from previous carotid stent placement is without hematoma, bleeding, erythema or purulence ?MSK:  No deformity noted.  Minimal trace pitting edema bilateral lower extremities symmetrically. ?Neuro:  No focal deficits appreciated. ?Other:   ? ? ?ED Results / Procedures / Treatments  ? ?Labs ?(all labs ordered are listed, but only abnormal results are displayed) ?Labs Reviewed  ?BASIC METABOLIC PANEL - Abnormal; Notable for the following components:  ?    Result Value  ? Glucose, Bld 150 (*)   ? BUN 38 (*)   ? Creatinine, Ser 1.92 (*)   ? GFR, Estimated 35 (*)   ? All other components within normal limits  ?CBC - Abnormal; Notable for the following components:  ? WBC 11.4 (*)   ? RBC 3.43 (*)   ? Hemoglobin 10.9 (*)   ? HCT 32.4 (*)   ? Platelets 115 (*)   ? All other components within normal limits  ?BRAIN NATRIURETIC PEPTIDE - Abnormal; Notable for the following components:  ? B Natriuretic Peptide 1,303.1 (*)   ? All other components within normal limits  ?  TROPONIN I (HIGH SENSITIVITY) - Abnormal; Notable for the following components:  ? Troponin I (High Sensitivity) 577 (*)   ? All other components within normal limits  ?RESP PANEL BY RT-PCR (FLU A&B, COVID) [id number]  ?PROTIME-INR  ?APTT  ?HEPARIN LEVEL (UNFRACTIONATED)  ?TROPONIN I (HIGH SENSITIVITY)  ? ? ?EKG ?Sinus rhythm, rate of 72 bpm.  Normal axis.  Right bundle.  Subtle J-point depressions to V2, V3 and V4.  No STEMI.  No recent comparison in the past 4 years ? ?RADIOLOGY ?2 view CXR reviewed by me with mild pulmonary vascular congestion ? ?Official radiology report(s): ?DG Chest 2 View ? ?Result Date: 02/28/2022 ?CLINICAL DATA:  Dist via EXAM: CHEST - 2 VIEW COMPARISON:  05/05/2018 FINDINGS: The lungs are symmetrically well inflated. Small bilateral pleural effusions are present. Mild perihilar interstitial thickening has developed suggestive of a perihilar interstitial pulmonary edema. Together, the findings may  reflect changes of mild cardiogenic failure. No pneumothorax. Cardiac size within normal limits. Multiple healed left rib fractures are identified. No acute bone abnormality. IMPRESSION: Mild perihilar interstitial pulmonary edema, possibly cardiogenic in nature. Small bilateral pleural effusions. Electronically Signed   By: Fidela Salisbury M.D.   On: 02/28/2022 21:07   ? ?PROCEDURES and INTERVENTIONS: ? ?.1-3 Lead EKG Interpretation ?Performed by: Vladimir Crofts, MD ?Authorized by: Vladimir Crofts, MD  ? ?  Interpretation: normal   ?  ECG rate:  70 ?  ECG rate assessment: normal   ?  Rhythm: sinus rhythm   ?  Ectopy: none   ?  Conduction: normal   ?.Critical Care ?Performed by: Vladimir Crofts, MD ?Authorized by: Vladimir Crofts, MD  ? ?Critical care provider statement:  ?  Critical care time (minutes):  30 ?  Critical care time was exclusive of:  Separately billable procedures and treating other patients ?  Critical care was necessary to treat or prevent imminent or life-threatening deterioration of the following conditions:  Cardiac failure ?  Critical care was time spent personally by me on the following activities:  Development of treatment plan with patient or surrogate, discussions with consultants, evaluation of patient's response to treatment, examination of patient, ordering and review of laboratory studies, ordering and review of radiographic studies, ordering and performing treatments and interventions, pulse oximetry, re-evaluation of patient's condition and review of old charts ? ?Medications  ?heparin ADULT infusion 100 units/mL (25000 units/269m) (800 Units/hr Intravenous New Bag/Given 02/28/22 2225)  ?furosemide (LASIX) injection 20 mg (has no administration in time range)  ?aspirin chewable tablet 243 mg (243 mg Oral Given 02/28/22 2202)  ?heparin bolus via infusion 4,000 Units (4,000 Units Intravenous Bolus from Bag 02/28/22 2225)  ? ? ? ?IMPRESSION / MDM / ASSESSMENT AND PLAN / ED COURSE  ?I reviewed the  triage vital signs and the nursing notes. ? ?81year old male presents to the ED with one day of DOE concerning for an NSTEMI requiring heparinization and medical admission. He looks systemically well. EKG with some new J point depressions without STEMI. BNP is elevated and CXR is congestion, so we will provide small dose of IV lasix.  His troponin is remarkably elevated, so we will finish his aspirin dose for the day and start heparinization.  Hemoglobin around baseline around baseline.  Consulted with medicine who agrees to admit ? ?Clinical Course as of 02/28/22 2233  ?Wed Feb 28, 2022  ?2233 Consult with Dr. PPosey Prontowho agrees with [DS]  ?  ?Clinical Course User Index ?[DS] SVladimir Crofts MD  ? ? ? ?  FINAL CLINICAL IMPRESSION(S) / ED DIAGNOSES  ? ?Final diagnoses:  ?NSTEMI (non-ST elevated myocardial infarction) ([place])  ?Shortness of breath  ? ? ? ?Rx / DC Orders  ? ?ED Discharge Orders   ? ? None  ? ?  ? ? ? ?Note:  This document was prepared using Dragon voice recognition software and may include unintentional dictation errors. ?  ?Vladimir Crofts, MD ?02/28/22 2236 ? ?

## 2022-02-28 NOTE — Progress Notes (Signed)
ANTICOAGULATION CONSULT NOTE - Initial Consult ? ?Pharmacy Consult for Heparin  ?Indication: chest pain/ACS ? ?Allergies  ?Allergen Reactions  ? Metoprolol   ?  Other reaction(s): DROWSINESS  ? Metoprolol Tartrate   ?  Other reaction(s): Other (See Comments)  ? ? ?Patient Measurements: ?Height: '5\' 7"'$  (170.2 cm) ?Weight: 64.8 kg (142 lb 13.7 oz) ?IBW/kg (Calculated) : 66.1 ?Heparin Dosing Weight: 68 kg  ? ?Vital Signs: ?Temp: 98.8 ?F (37.1 ?C) (04/26 2039) ?Temp Source: Oral (04/26 2039) ?BP: 161/63 (04/26 2039) ?Pulse Rate: 71 (04/26 2039) ? ?Labs: ?Recent Labs  ?  02/26/22 ?0724 02/27/22 ?7262 02/28/22 ?2041  ?HGB  --  9.7* 10.9*  ?HCT  --  29.0* 32.4*  ?PLT  --  113* 115*  ?LABPROT  --   --  14.2  ?INR  --   --  1.1  ?CREATININE 1.85* 2.25* 1.92*  ?TROPONINIHS  --   --  577*  ? ? ?Estimated Creatinine Clearance: 28.1 mL/min (A) (by C-G formula based on SCr of 1.92 mg/dL (H)). ? ? ?Medical History: ?Past Medical History:  ?Diagnosis Date  ? Anxiety   ? Atrial fibrillation ([place])   ? BPH (benign prostatic hyperplasia)   ? Carotid artery disease ([place])   ? s/p left carotid endarterectomy at [facility] in 2014  ? Carotid stenosis   ? bilateral  ? CKD (chronic kidney disease)   ? Elevated PSA   ? Fracture, thoracic vertebra ([place]) 05/06/2018  ? T3,T9 endplate fracture  ? Hemothorax on left 05/06/2018  ? HLD (hyperlipidemia)   ? HTN (hypertension)   ? Hyperlipidemia   ? Hypertension   ? Insomnia   ? Rib fractures 05/06/2018  ? Weak urinary stream   ? ? ?Medications:  ?(Not in a hospital admission)  ? ?Assessment: ?Pharmacy consulted to dose heparin in this 81 year old male admitted with ACS/NSTEMI.  No prior anticoag noted.   ?CrCl = 28.1 ml/min ? ?Goal of Therapy:  ?Heparin level 0.3-0.7 units/ml ?Monitor platelets by anticoagulation protocol: Yes ?  ?Plan:  ?Give 4000 units bolus x 1 ?Start heparin infusion at 800 units/hr ?Check anti-Xa level in 8 hours and daily while on heparin ?Continue to monitor H&H and  platelets ? ?Manisha Cancel D ?02/28/2022,9:52 PM ? ? ?

## 2022-02-28 NOTE — ED Notes (Signed)
Pt to ED for SOB that started today. Pt denies CP. Pt had a carotid artery stent placed on Monday. Pt states he took his first dose of plavix this AM. Pt states he has been having SOB with exertion. ?Pt had incision in groin for stent, no signs of erythema or swelling present; denies pain at site.  ?Pt is A&Ox4.  ? ?

## 2022-02-28 NOTE — Assessment & Plan Note (Addendum)
Patient presented with progressive shortness of breath, cough and intermittent chest pain.  ?He presented with symptoms which are angina equivalent. ?BNP 1303, troponin 577> 567>566> 435 ?Patient initiated on heparin gtt. for NSTEMI. ?Cardiology consulted- Dr. Saralyn Pilar.  ?Cardiology thinks demand ischemia in the setting of A-fib with RVR. ?Patient continued on aspirin, Plavix, Coreg, lisinopril, statin therapy with Crestor, heparin GTT. ?Last echo LVEF 60 to 65%. ?Repeat echocardiogram shows LVEF 60 to 65%, no regional wall motion abnormalities. ?Heparin discontinued. ? ?

## 2022-02-28 NOTE — Assessment & Plan Note (Addendum)
Status post stent, continue aspirin and Plavix as prescribed. ?

## 2022-02-28 NOTE — ED Notes (Addendum)
Pt given night time meds from home supply: citalopram '40mg'$ , trazadone '50mg'$ , flomax .'4mg'$ ; pharmacy notified and updated.  ?

## 2022-02-28 NOTE — Assessment & Plan Note (Addendum)
Continue metoprolol, amlodipine, as needed hydralazine. ?Lisinopril hold due to CKD. ? ?

## 2022-03-01 ENCOUNTER — Inpatient Hospital Stay
Admit: 2022-03-01 | Discharge: 2022-03-01 | Disposition: A | Discharge status: Home / Self Care | Payer: Medicare Other | Attending: Cardiology | Admitting: Cardiology

## 2022-03-01 ENCOUNTER — Other Ambulatory Visit: Payer: Self-pay

## 2022-03-01 DIAGNOSIS — I5033 Acute on chronic diastolic (congestive) heart failure: Secondary | ICD-10-CM

## 2022-03-01 DIAGNOSIS — I214 Non-ST elevation (NSTEMI) myocardial infarction: Secondary | ICD-10-CM | POA: Diagnosis not present

## 2022-03-01 LAB — ECHOCARDIOGRAM COMPLETE
AR max vel: 3.1 cm2
AV Area VTI: 2.94 cm2
AV Area mean vel: 2.96 cm2
AV Mean grad: 3 mmHg
AV Peak grad: 5.6 mmHg
Ao pk vel: 1.19 m/s
Area-P 1/2: 9.37 cm2
Height: 67 in
S' Lateral: 2.8 cm
Weight: 2285.73 oz

## 2022-03-01 LAB — LIPID PANEL
Cholesterol: 109 mg/dL (ref 0–200)
HDL: 30 mg/dL — ABNORMAL LOW (ref >40)
LDL Cholesterol: 69 mg/dL (ref 0–99)
Total CHOL/HDL Ratio: 3.6 RATIO
Triglycerides: 49 mg/dL (ref <150)
VLDL: 10 mg/dL (ref 0–40)

## 2022-03-01 LAB — TSH: TSH: 0.807 u[IU]/mL (ref 0.350–4.500)

## 2022-03-01 LAB — GLUCOSE, CAPILLARY: Glucose-Capillary: 185 mg/dL — ABNORMAL HIGH (ref 70–99)

## 2022-03-01 LAB — T4, FREE: Free T4: 1.02 ng/dL (ref 0.61–1.12)

## 2022-03-01 LAB — HEPARIN LEVEL (UNFRACTIONATED)
Heparin Unfractionated: 0.21 IU/mL — ABNORMAL LOW (ref 0.30–0.70)
Heparin Unfractionated: 0.27 IU/mL — ABNORMAL LOW (ref 0.30–0.70)

## 2022-03-01 MED ORDER — METOPROLOL TARTRATE 5 MG/5ML IV SOLN
5.0000 mg | INTRAVENOUS | Status: DC | PRN
Start: 2022-03-01 — End: 2022-03-03
  Administered 2022-03-01: 5 mg via INTRAVENOUS
  Filled 2022-03-01: qty 5

## 2022-03-01 MED ORDER — FUROSEMIDE 10 MG/ML IJ SOLN
40.0000 mg | Freq: Two times a day (BID) | INTRAMUSCULAR | Status: AC
  Administered 2022-03-01 – 2022-03-02 (×3): 40 mg via INTRAVENOUS
  Filled 2022-03-01 (×3): qty 4

## 2022-03-01 MED ORDER — HEPARIN BOLUS VIA INFUSION
1000.0000 [IU] | Freq: Once | INTRAVENOUS | Status: AC
  Administered 2022-03-01: 1000 [IU] via INTRAVENOUS
  Filled 2022-03-01: qty 1000

## 2022-03-01 MED ORDER — METOPROLOL TARTRATE 25 MG PO TABS
25.0000 mg | ORAL_TABLET | Freq: Two times a day (BID) | ORAL | Status: DC
  Administered 2022-03-01 (×2): 25 mg via ORAL
  Filled 2022-03-01 (×3): qty 1

## 2022-03-01 NOTE — Hospital Course (Addendum)
This 81 years old male with PMH significant for anxiety, atrial fibrillation, BPH, carotid artery stenosis s/p stent, elevated PSA, hypertension, hyperlipidemia presented in the ED with cough, shortness of breath which is progressively getting worse. Patient was discharged on 02/27/2022 after undergoing right ICA stent on 02/22/2022 for carotid artery stenosis.  Patient tolerated procedure well.  Patient was admitted for CHF exacerbation and possible NSTEMI given elevated troponins.  Cardiology was consulted recommended to continue heparin gtt. elevated troponin could be demand ischemia in the setting of A-fib with RVR.  No plans for any ischemic evaluation at this time.  Patient continued to remains in atrial fibrillation but heart rate well controlled.  Cardizem discontinued,  heparin discontinued,  started on Eliquis.  Patient feels better and cleared from cardiology to be discharged.  Patient is being discharged home on Eliquis and metoprolol. ?

## 2022-03-01 NOTE — Progress Notes (Signed)
ANTICOAGULATION CONSULT NOTE - Initial Consult ? ?Pharmacy Consult for Heparin  ?Indication: chest pain/ACS ? ?Allergies  ?Allergen Reactions  ? Metoprolol   ?  Other reaction(s): DROWSINESS ? ?Reviewed with patient and wife 03/01/22 - they do not remember patient having any reaction to metoprolol tartrate. In fact, the patient receive IV metoprolol during his hospitalization in 2019 without apparent side effects.   ? Metoprolol Tartrate   ?  Other reaction(s): Other (See Comments)  ? ? ?Patient Measurements: ?Height: '5\' 7"'$  (170.2 cm) ?Weight: 64.8 kg (142 lb 13.7 oz) ?IBW/kg (Calculated) : 66.1 ?Heparin Dosing Weight: 68 kg  ? ?Vital Signs: ?BP: 109/55 (04/27 1400) ?Pulse Rate: 75 (04/27 1400) ? ?Labs: ?Recent Labs  ?  02/27/22 ?2130 02/28/22 ?2041 02/28/22 ?2049 02/28/22 ?2255 03/01/22 ?8657 03/01/22 ?1523  ?HGB 9.7* 10.9*  --   --   --   --   ?HCT 29.0* 32.4*  --   --   --   --   ?PLT 113* 115*  --   --   --   --   ?APTT  --   --  32  --   --   --   ?LABPROT  --  14.2  --   --   --   --   ?INR  --  1.1  --   --   --   --   ?HEPARINUNFRC  --   --   --   --  0.21* 0.27*  ?CREATININE 2.25* 1.92*  --   --   --   --   ?TROPONINIHS  --  577*  --  565*  --   --   ? ? ? ?Estimated Creatinine Clearance: 28.1 mL/min (A) (by C-G formula based on SCr of 1.92 mg/dL (H)). ? ? ?Medical History: ?Past Medical History:  ?Diagnosis Date  ? Anxiety   ? Atrial fibrillation ([place])   ? BPH (benign prostatic hyperplasia)   ? Carotid artery disease ([place])   ? s/p left carotid endarterectomy at [facility] in 2014  ? Carotid stenosis   ? bilateral  ? CKD (chronic kidney disease)   ? Elevated PSA   ? Fracture, thoracic vertebra ([place]) 05/06/2018  ? T3,T9 endplate fracture  ? Hemothorax on left 05/06/2018  ? HLD (hyperlipidemia)   ? HTN (hypertension)   ? Hyperlipidemia   ? Hypertension   ? Insomnia   ? Rib fractures 05/06/2018  ? Weak urinary stream   ? ? ?Medications:  ?(Not in a hospital admission) ? ?Assessment: ?Pharmacy consulted to dose heparin  in this 81 year old male admitted with ACS/NSTEMI.  No prior anticoag noted.   ?CrCl = 28.1 ml/min ? ?Goal of Therapy:  ?Heparin level 0.3-0.7 units/ml ?Monitor platelets by anticoagulation protocol: Yes ?  ?Plan:  ?4/27 1523 HL 0.27 Subtherapeutic  ?Will order heparin 1000 units IV X 1 bolus and increase drip rate to 1050 units/hr.  ?Will recheck HL 8 hrs after rate change.  ?CBC daily ? ?Dorothe Pea, PharmD, BCPS ?Clinical Pharmacist   ?03/01/2022,3:48 PM ? ? ?

## 2022-03-01 NOTE — ED Notes (Signed)
Cardiology at bedside assessing patient

## 2022-03-01 NOTE — Progress Notes (Addendum)
?Progress Note ? ? ?Patient: Xavier Davis WGN:[id number] DOB: [birth year] DOA: 02/28/2022     1  ? ?DOS: the patient was seen and examined on 03/01/2022 ?  ?Brief hospital course: ?This 81 years old male with PMH significant for anxiety, atrial fibrillation, BPH, carotid artery stenosis s/p stent, elevated PSA, hypertension, hyperlipidemia presented in the ED with  cough, shortness of breath which is progressively getting worse. Patient was discharged on 02/27/2022 after receiving right ICA stent on 02/22/2022 for carotid artery stenosis.  Patient tolerated procedure well.  Patient is admitted for CHF exacerbation and possible NSTEMI given elevated troponins.  Cardiology is consulted recommended to continue heparin gtt. elevated troponin could be demand ischemia in the setting of A-fib with RVR.  No plans for any ischemic evaluation at this time. ? ?Assessment and Plan: ?* NSTEMI (non-ST elevated myocardial infarction) ([place]) ?Patient presented with progressive shortness of breath, cough and intermittent chest pain. ?Patient presenting with symptoms which are angina equivalent. ?BNP 1303, troponin 577> 566 ?Patient initiated on heparin gtt. for non-STEMI. ?Cardiology consulted- Dr. Saralyn Pilar.  ?Cardiology thinks demand ischemia in the setting of A-fib with RVR. ?Patient continued on aspirin, Plavix, Coreg, lisinopril, statin therapy with Crestor, heparin GTT. ?Last echo LVEF 60 to 65%. ?Repeat 2D echocardiogram. ? ?Acute on chronic diastolic CHF (congestive heart failure) ([place]) ?BNP 1303, chest x-ray shows congestion. ?Continue Lasix 40 mg IV every 12 hours.  Monitor daily weight, intake output charting. ?Echocardiogram shows LVEF 55 to 60%. ? ?CKD (chronic kidney disease) stage 3, GFR 30-59 ml/min ?Lab Results  ?Component Value Date  ? CREATININE 1.92 (H) 02/28/2022  ? CREATININE 2.25 (H) 02/27/2022  ? CREATININE 1.85 (H) 02/26/2022  ?Creatinine stable at 1.92. ?We will hold patient's lisinopril overnight. ?Continue  gentle hydration. ? ?Paroxysmal atrial fibrillation ([place]) ?Patient remains in sinus rhythm.  Patient is started on heparin gtt. ?Continue Plavix and aspirin as per primary cardiologist. ?Coreg changed with metoprolol for heart rate control. ?Obtain 2D echocardiogram. ? ?Hypertension ?Continue Coreg, amlodipine, as needed hydralazine. ?Lisinopril hold due to CKD. ? ? ?Bilateral carotid artery stenosis ?Status post stent, Plavix continued. ? ? ?Subjective: Patient was seen and examined at bedside.  Overnight events noted.   ?Patient reports feeling much improved.  He denies any chest pain,  reports cough and shortness of breath. ? ?Physical Exam: ?Vitals:  ? 03/01/22 1125 03/01/22 1230 03/01/22 1300 03/01/22 1400  ?BP: 105/71 (!) 106/53 (!) 102/52 (!) 109/55  ?Pulse: 99 89 94 75  ?Resp:  18 (!) 22 (!) 21  ?Temp:      ?TempSrc:      ?SpO2:  92% 92% 96%  ?Weight:      ?Height:      ? ?General exam: Appears comfortable, not in any acute distress. ?Respiratory system: Bibasilar crackles+, respiratory effort normal, RR 15 ?Cardiovascular system: S1-S2 heard, regular rate and rhythm, no murmur ?Gastrointestinal system: Abdomen is soft, nontender, nondistended, BS+ ?Central nervous system: Alert, oriented x 3, no focal neurological deficits ?Extremities: Edema+, no cyanosis, no clubbing ?Psychiatry: Mood, insight, judgment normal ? ? ?Data Reviewed: ?I have Reviewed nursing notes, Vitals, and Lab results since pt's last encounter. Pertinent lab results CBC, BMP, BNP, troponins ?I have ordered test including CBC, BMP ?I have reviewed the last note from cardiology,  ?I have discussed pt's care plan and test results with patient.  ? ?Family Communication: Wife at bedside ? ?Disposition: ?Status is: Inpatient ?Remains inpatient appropriate because: Admitted for shortness of breath found to have  non-STEMI and CHF exacerbation A-fib with RVR.  Cardiology consulted and started on heparin gtt. ? ? Planned Discharge Destination:  Home ? ? ? ? ?Time spent: 50 minutes ? ?Author: ?Shawna Clamp, MD ?03/01/2022 3:13 PM ? ?For on call review [email or web address].  ?

## 2022-03-01 NOTE — Consult Note (Signed)
?[facility] CARDIOLOGY CONSULT NOTE  ? ?    ?Patient ID: ?[name] ?MRN: [id number] ?DOB/AGE: Apr 15, 1941 81 y.o. ? ?Admit date: 02/28/2022 ?Referring Physician Dr. Florina Ou ?Primary Physician Dr. Tracie Harrier ?Primary Cardiologist Dr. Nehemiah Massed ?Reason for Consultation ?NSTEMI ? ?HPI: Xavier Davis is an 81 year old male with a past medical history of paroxysmal atrial fibrillation not currently on anticoagulation, history of bilateral carotid artery stenosis s/p left endarterectomy & subsequent stenting 2014 and recent right endarterectomy & stents 02/26/22, CKD 3, hypertension, hyperlipidemia, anxiety who presented to [facility] ED 02/28/2022 with shortness of breath. ? ?He was given 243 mg of aspirin, metoprolol tartrate 5 mg IV x 1 , IV Lasix 20 mg x 1, and started on a heparin drip.  The patient presents with his wife who contributes to the history.  He states that yesterday after eating lunch he says he started breathing heavily, thinking he was getting a chest cold as he was also coughing up clear mucus.  He said he was very short of breath and walking made it worse and he could feel his heart beating irregularly.  He denies any pain in his chest, no dizziness, no presyncope.  He says he thinks his belly has been a little swollen and has not had very good appetite since after his carotid endarterectomy 3 days ago.  He says he feels better since being in the ED, noting his breathing has improved.  He has just over half a liter of urine output so far. ? ?Of note, he recently had an exercise Myoview 02/12/2022 where he exercised for 9 minutes on the Bruce protocol and imaging that showed normal perfusion and normal wall motion with estimated EF 57% and an echocardiogram the same day that showed EF greater than 55%, G1 DD, moderate MR, moderate TR, moderate pulmonary hypertension and trace AI.  This was all performed as a preoperative medical optimization prior to his endarterectomy.  He was seen by Dr.  Nehemiah Massed 4/18 where he was started on a low-dose of carvedilol and anticoagulation with Eliquis was discussed with the patient but was deferred until after his procedure on 4/24. ? ?Is a former smoker, denies history of heart attack, cardiac catheterization.  Family history includes stroke in the patient's father. ? ?Blood pressure during interview 138/79 heart rate between 97 and 140 in atrial fibrillation.  On room air. ? ?His labs are notable for a potassium of 4.6, BUN 38, creatinine 1.32 and GFR 35 (downtrending from 4/25 six 2.25, 29).  BNP is elevated to 1300, high-sensitivity troponin elevated but flat trending 577-565.  Total cholesterol 109, LDL 69, HDL 30.  WBCs 11.4, H&H 10.9/32.4.  Platelets 115. ? ?Review of systems complete and found to be negative unless listed above  ? ?Past Medical History:  ?Diagnosis Date  ? Anxiety   ? Atrial fibrillation ([place])   ? BPH (benign prostatic hyperplasia)   ? Carotid artery disease ([place])   ? s/p left carotid endarterectomy at [facility] in 2014  ? Carotid stenosis   ? bilateral  ? CKD (chronic kidney disease)   ? Elevated PSA   ? Fracture, thoracic vertebra ([place]) 05/06/2018  ? T3,T9 endplate fracture  ? Hemothorax on left 05/06/2018  ? HLD (hyperlipidemia)   ? HTN (hypertension)   ? Hyperlipidemia   ? Hypertension   ? Insomnia   ? Rib fractures 05/06/2018  ? Weak urinary stream   ?  ?Past Surgical History:  ?Procedure Laterality Date  ? APPENDECTOMY    ?  CAROTID PTA/STENT INTERVENTION Right 02/26/2022  ? Procedure: CAROTID PTA/STENT INTERVENTION;  Surgeon: Algernon Huxley, MD;  Location: [facility] CV LAB;  Service: Cardiovascular;  Laterality: Right;  ? carotid stenosis Left   ? ces and stent placement  ? cataract surgery Bilateral   ?  ?(Not in a hospital admission) ? ?Social History  ? ?Socioeconomic History  ? Marital status: Married  ?  Spouse name: Not on file  ? Number of children: Not on file  ? Years of education: Not on file  ? Highest education level: Not on file   ?Occupational History  ? Not on file  ?Tobacco Use  ? Smoking status: Former  ? Smokeless tobacco: Never  ? Tobacco comments:  ?  quit 40 years ago  ?Substance and Sexual Activity  ? Alcohol use: Yes  ?  Alcohol/week: 4.0 standard drinks  ?  Types: 4 Cans of beer per week  ?  Comment: occasional 4 cans of beer/wkly  ? Drug use: Never  ? Sexual activity: Not on file  ?Other Topics Concern  ? Not on file  ?Social History Narrative  ? ** Merged History Encounter **  ?    ? ?Social Determinants of Health  ? ?Financial Resource Strain: Not on file  ?Food Insecurity: Not on file  ?Transportation Needs: Not on file  ?Physical Activity: Not on file  ?Stress: Not on file  ?Social Connections: Not on file  ?Intimate Partner Violence: Not on file  ?  ?Family History  ?Problem Relation Age of Onset  ? Stroke Father   ? CVA Father   ? Diabetes Mellitus II Father   ? Prostate cancer Neg Hx   ? Kidney disease Neg Hx   ? Kidney cancer Neg Hx   ? Bladder Cancer Neg Hx   ?  ?PHYSICAL EXAM ?General: Pleasant elderly Caucasian male sitting at incline in ED stretcher with wife at bedside, well nourished, in no acute distress. ?HEENT:  Normocephalic and atraumatic. ?Neck:  No JVD.  ?Lungs: Normal respiratory effort on room air.  Trace crackles in right base.  Heart: HRRR . Normal S1 and S2 without gallops or murmurs. Radial & DP pulses 2+ bilaterally. ?Abdomen: Non-distended appearing.  ?Msk: Normal strength and tone for age. ?Extremities: Warm and well perfused. No clubbing, cyanosis.  Trace bilateral lower extremity edema to the top of his sock.  ?Neuro: Alert and oriented X 3. ?Psych:  Answers questions appropriately.  ? ?Labs: ?  ?Lab Results  ?Component Value Date  ? WBC 11.4 (H) 02/28/2022  ? HGB 10.9 (L) 02/28/2022  ? HCT 32.4 (L) 02/28/2022  ? MCV 94.5 02/28/2022  ? PLT 115 (L) 02/28/2022  ?  ?Recent Labs  ?Lab 02/28/22 ?2041  ?NA 136  ?K 4.6  ?CL 107  ?CO2 23  ?BUN 38*  ?CREATININE 1.92*  ?CALCIUM 8.9  ?GLUCOSE 150*  ? ?No  results found for: CKTOTAL, CKMB, CKMBINDEX, TROPONINI  ?Lab Results  ?Component Value Date  ? CHOL 109 02/28/2022  ? ?Lab Results  ?Component Value Date  ? HDL 30 (L) 02/28/2022  ? ?Lab Results  ?Component Value Date  ? [place] 69 02/28/2022  ? ?Lab Results  ?Component Value Date  ? TRIG 49 02/28/2022  ? ?Lab Results  ?Component Value Date  ? CHOLHDL 3.6 02/28/2022  ? ?No results found for: LDLDIRECT  ?  ?Radiology: DG Chest 2 View ? ?Result Date: 02/28/2022 ?CLINICAL DATA:  Dist via EXAM: CHEST - 2 VIEW COMPARISON:  05/05/2018 FINDINGS: The lungs are symmetrically well inflated. Small bilateral pleural effusions are present. Mild perihilar interstitial thickening has developed suggestive of a perihilar interstitial pulmonary edema. Together, the findings may reflect changes of mild cardiogenic failure. No pneumothorax. Cardiac size within normal limits. Multiple healed left rib fractures are identified. No acute bone abnormality. IMPRESSION: Mild perihilar interstitial pulmonary edema, possibly cardiogenic in nature. Small bilateral pleural effusions. Electronically Signed   By: Fidela Salisbury M.D.   On: 02/28/2022 21:07  ? ?PERIPHERAL VASCULAR CATHETERIZATION ? ?Result Date: 02/26/2022 ?See surgical note for result.  ? ?CARDIOLOGY DEPARTMENT  ?[facility]  ?[address]  ?[place], [place], [place]  [address]  ?[phone]  ? ?Procedure: Exercise Myocardial Perfusion Imaging    ?ONE day procedure  ? ?Indication: Paroxysmal atrial fibrillation (CMS-HCC)  ?Plan: NM myocardial perfusion SPECT multiple (stress  ?      and rest), ECG stress test only  ? ?Abnormal ECG  ?Plan: NM myocardial perfusion SPECT multiple (stress  ?      and rest), ECG stress test only  ? ?Ordering Physician:  ? ?Dr. Serafina Royals  ? ?Clinical History:  ?81 y.o. year old male  ?Vitals: Height: 67 in  Weight: 151 lb  ?Cardiac risk factors include:    ? ?AFIB, PVD, Hyperlipidemia and HTN  ? ? ?Procedure:  ?The patient  performed treadmill exercise using a Bruce protocol for 9:00  ?minutes. The exercise test was stopped due to fatigue.  Blood pressure  ?response was normal. The patient did not develop any symptoms other than  ?f

## 2022-03-01 NOTE — Progress Notes (Signed)
ANTICOAGULATION CONSULT NOTE - Initial Consult ? ?Pharmacy Consult for Heparin  ?Indication: chest pain/ACS ? ?Allergies  ?Allergen Reactions  ? Metoprolol   ?  Other reaction(s): DROWSINESS  ? Metoprolol Tartrate   ?  Other reaction(s): Other (See Comments)  ? ? ?Patient Measurements: ?Height: '5\' 7"'$  (170.2 cm) ?Weight: 64.8 kg (142 lb 13.7 oz) ?IBW/kg (Calculated) : 66.1 ?Heparin Dosing Weight: 68 kg  ? ?Vital Signs: ?Temp: 98.8 ?F (37.1 ?C) (04/26 2039) ?Temp Source: Oral (04/26 2039) ?BP: 134/66 (04/27 0630) ?Pulse Rate: 107 (04/27 0630) ? ?Labs: ?Recent Labs  ?  02/26/22 ?0724 02/27/22 ?9030 02/28/22 ?2041 02/28/22 ?2049 02/28/22 ?2255 03/01/22 ?0923  ?HGB  --  9.7* 10.9*  --   --   --   ?HCT  --  29.0* 32.4*  --   --   --   ?PLT  --  113* 115*  --   --   --   ?APTT  --   --   --  32  --   --   ?LABPROT  --   --  14.2  --   --   --   ?INR  --   --  1.1  --   --   --   ?HEPARINUNFRC  --   --   --   --   --  0.21*  ?CREATININE 1.85* 2.25* 1.92*  --   --   --   ?TROPONINIHS  --   --  577*  --  565*  --   ? ? ? ?Estimated Creatinine Clearance: 28.1 mL/min (A) (by C-G formula based on SCr of 1.92 mg/dL (H)). ? ? ?Medical History: ?Past Medical History:  ?Diagnosis Date  ? Anxiety   ? Atrial fibrillation ([place])   ? BPH (benign prostatic hyperplasia)   ? Carotid artery disease ([place])   ? s/p left carotid endarterectomy at [facility] in 2014  ? Carotid stenosis   ? bilateral  ? CKD (chronic kidney disease)   ? Elevated PSA   ? Fracture, thoracic vertebra ([place]) 05/06/2018  ? T3,T9 endplate fracture  ? Hemothorax on left 05/06/2018  ? HLD (hyperlipidemia)   ? HTN (hypertension)   ? Hyperlipidemia   ? Hypertension   ? Insomnia   ? Rib fractures 05/06/2018  ? Weak urinary stream   ? ? ?Medications:  ?(Not in a hospital admission) ? ?Assessment: ?Pharmacy consulted to dose heparin in this 80 year old male admitted with ACS/NSTEMI.  No prior anticoag noted.   ?CrCl = 28.1 ml/min ? ?Goal of Therapy:  ?Heparin level 0.3-0.7  units/ml ?Monitor platelets by anticoagulation protocol: Yes ?  ?Plan:  ?4/27:  HL @ 0609 = 0.21, subtherapeutic  ?Will order heparin 1000 units IV X 1 bolus and increase drip rate to 950 units/hr.  ?Will recheck HL 8 hrs after rate change.  ? ?Alanie Syler D ?03/01/2022,6:57 AM ? ? ?

## 2022-03-01 NOTE — Progress Notes (Signed)
Admission profile updated. ?

## 2022-03-01 NOTE — Assessment & Plan Note (Addendum)
BNP 1303>545  chest x-ray shows congestion. ?Continue Lasix 40 mg IV every 12 hours.   ?Monitor daily weight, intake output charting. ?Echocardiogram shows LVEF 55 to 60%. ? ? ?Intake/Output Summary (Last 24 hours) at 03/02/2022 1204 ?Last data filed at 03/02/2022 1020 ?Gross per 24 hour  ?Intake 1118.38 ml  ?Output 1450 ml  ?Net -331.62 ml  ? ?

## 2022-03-01 NOTE — ED Notes (Signed)
Dr. Paraschos at bedside. 

## 2022-03-01 NOTE — Progress Notes (Signed)
*  PRELIMINARY RESULTS* ?Echocardiogram ?2D Echocardiogram has been performed. ? ?Jaquon Gingerich, Sonia Side ?03/01/2022, 2:45 PM ?

## 2022-03-02 ENCOUNTER — Other Ambulatory Visit (HOSPITAL_COMMUNITY): Payer: Self-pay

## 2022-03-02 DIAGNOSIS — I214 Non-ST elevation (NSTEMI) myocardial infarction: Secondary | ICD-10-CM | POA: Diagnosis not present

## 2022-03-02 LAB — HEPARIN LEVEL (UNFRACTIONATED)
Heparin Unfractionated: 0.11 IU/mL — ABNORMAL LOW (ref 0.30–0.70)
Heparin Unfractionated: 0.5 IU/mL (ref 0.30–0.70)

## 2022-03-02 LAB — COMPREHENSIVE METABOLIC PANEL
ALT: 7 U/L (ref 0–44)
AST: 14 U/L — ABNORMAL LOW (ref 15–41)
Albumin: 3.4 g/dL — ABNORMAL LOW (ref 3.5–5.0)
Alkaline Phosphatase: 39 U/L (ref 38–126)
Anion gap: 8 (ref 5–15)
BUN: 43 mg/dL — ABNORMAL HIGH (ref 8–23)
CO2: 26 mmol/L (ref 22–32)
Calcium: 8.5 mg/dL — ABNORMAL LOW (ref 8.9–10.3)
Chloride: 102 mmol/L (ref 98–111)
Creatinine, Ser: 2.02 mg/dL — ABNORMAL HIGH (ref 0.61–1.24)
GFR, Estimated: 33 mL/min — ABNORMAL LOW (ref >60)
Glucose, Bld: 129 mg/dL — ABNORMAL HIGH (ref 70–99)
Potassium: 3.9 mmol/L (ref 3.5–5.1)
Sodium: 136 mmol/L (ref 135–145)
Total Bilirubin: 1 mg/dL (ref 0.3–1.2)
Total Protein: 6 g/dL — ABNORMAL LOW (ref 6.5–8.1)

## 2022-03-02 LAB — CBC
HCT: 28.5 % — ABNORMAL LOW (ref 39.0–52.0)
Hemoglobin: 9.7 g/dL — ABNORMAL LOW (ref 13.0–17.0)
MCH: 32.1 pg (ref 26.0–34.0)
MCHC: 34 g/dL (ref 30.0–36.0)
MCV: 94.4 fL (ref 80.0–100.0)
Platelets: 113 10*3/uL — ABNORMAL LOW (ref 150–400)
RBC: 3.02 MIL/uL — ABNORMAL LOW (ref 4.22–5.81)
RDW: 11.9 % (ref 11.5–15.5)
WBC: 7.4 10*3/uL (ref 4.0–10.5)
nRBC: 0 % (ref 0.0–0.2)

## 2022-03-02 LAB — MAGNESIUM: Magnesium: 1.9 mg/dL (ref 1.7–2.4)

## 2022-03-02 LAB — PHOSPHORUS: Phosphorus: 3.2 mg/dL (ref 2.5–4.6)

## 2022-03-02 LAB — BRAIN NATRIURETIC PEPTIDE: B Natriuretic Peptide: 545.2 pg/mL — ABNORMAL HIGH (ref 0.0–100.0)

## 2022-03-02 LAB — TROPONIN I (HIGH SENSITIVITY)
Troponin I (High Sensitivity): 466 ng/L (ref <18)
Troponin I (High Sensitivity): 435 ng/L (ref <18)

## 2022-03-02 MED ORDER — CITALOPRAM HYDROBROMIDE 20 MG PO TABS
20.0000 mg | ORAL_TABLET | Freq: Every day | ORAL | Status: DC
  Administered 2022-03-03: 20 mg via ORAL
  Filled 2022-03-02: qty 1

## 2022-03-02 MED ORDER — APIXABAN 2.5 MG PO TABS
2.5000 mg | ORAL_TABLET | Freq: Two times a day (BID) | ORAL | Status: DC
  Administered 2022-03-02 – 2022-03-03 (×3): 2.5 mg via ORAL
  Filled 2022-03-02 (×3): qty 1

## 2022-03-02 MED ORDER — ATORVASTATIN CALCIUM 20 MG PO TABS
40.0000 mg | ORAL_TABLET | Freq: Every day | ORAL | Status: DC
  Administered 2022-03-03: 40 mg via ORAL
  Filled 2022-03-02: qty 2

## 2022-03-02 MED ORDER — METOPROLOL TARTRATE 25 MG PO TABS
12.5000 mg | ORAL_TABLET | Freq: Two times a day (BID) | ORAL | Status: DC
  Administered 2022-03-02 – 2022-03-03 (×2): 12.5 mg via ORAL
  Filled 2022-03-02 (×2): qty 1

## 2022-03-02 MED ORDER — HEPARIN BOLUS VIA INFUSION
2000.0000 [IU] | Freq: Once | INTRAVENOUS | Status: AC
Start: 2022-03-02 — End: 2022-03-02
  Administered 2022-03-02: 2000 [IU] via INTRAVENOUS
  Filled 2022-03-02: qty 2000

## 2022-03-02 MED ORDER — HALOPERIDOL LACTATE 5 MG/ML IJ SOLN: 2.5000 mg | Freq: Once | INTRAMUSCULAR | Status: DC

## 2022-03-02 NOTE — Plan of Care (Signed)

## 2022-03-02 NOTE — Progress Notes (Signed)
?[facility] CARDIOLOGY CONSULT NOTE  ? ?    ?Patient ID: ?Xavier Davis ?MRN: [id number] ?DOB/AGE: 81-Jan-1942 81 y.o. ? ?Admit date: 02/28/2022 ?Referring Physician Dr. Florina Ou ?Primary Physician Dr. Tracie Harrier ?Primary Cardiologist Dr. Nehemiah Massed ?Reason for Consultation ?NSTEMI ? ?HPI: Xavier Davis is an 81 year old male with a past medical history of paroxysmal atrial fibrillation not currently on anticoagulation, history of bilateral carotid artery stenosis s/p left endarterectomy & subsequent stenting 2014 and recent right endarterectomy & stents 02/26/22, CKD 3, hypertension, hyperlipidemia, anxiety who presented to [facility] ED 02/28/2022 with shortness of breath and was found to be in AF with RVR and with an elevated troponin. Back in NSR overnight, rate controlled.  ? ?Interval History: ?-feels much better today, converted to sinus rhythm overnight.  ?-breathing, LE swelling improved. No palpitations or chest pain.  ?-eager to get up and walk.  ?-echo resulted with preserved EF 60-65%, mi-mod MR without WMA ? ?Review of systems complete and found to be negative unless listed above  ? ?Past Medical History:  ?Diagnosis Date  ? Anxiety   ? Atrial fibrillation ([place])   ? BPH (benign prostatic hyperplasia)   ? Carotid artery disease ([place])   ? s/p left carotid endarterectomy at [facility] in 2014  ? Carotid stenosis   ? bilateral  ? CKD (chronic kidney disease)   ? Elevated PSA   ? Fracture, thoracic vertebra ([place]) 05/06/2018  ? T3,T9 endplate fracture  ? Hemothorax on left 05/06/2018  ? HLD (hyperlipidemia)   ? HTN (hypertension)   ? Hyperlipidemia   ? Hypertension   ? Insomnia   ? Rib fractures 05/06/2018  ? Weak urinary stream   ?  ?Past Surgical History:  ?Procedure Laterality Date  ? APPENDECTOMY    ? CAROTID PTA/STENT INTERVENTION Right 02/26/2022  ? Procedure: CAROTID PTA/STENT INTERVENTION;  Surgeon: Algernon Huxley, MD;  Location: [facility] CV LAB;  Service: Cardiovascular;  Laterality: Right;  ? carotid  stenosis Left   ? ces and stent placement  ? cataract surgery Bilateral   ?  ?Medications Prior to Admission  ?Medication Sig Dispense Refill Last Dose  ? amLODipine (NORVASC) 10 MG tablet Take 10 mg by mouth daily.   02/28/2022 at 0600  ? aspirin EC 81 MG EC tablet Take 1 tablet (81 mg total) by mouth daily at 6 (six) AM. Swallow whole. 30 tablet 11 02/28/2022 at 0600  ? carvedilol (COREG) 3.125 MG tablet Take 3.125 mg by mouth 2 (two) times daily with a meal.   02/28/2022  ? citalopram (CELEXA) 40 MG tablet Take 40 mg by mouth daily.   02/28/2022 at 0600  ? clopidogrel (PLAVIX) 75 MG tablet Take 1 tablet (75 mg total) by mouth daily at 6 (six) AM. 30 tablet 11 02/28/2022 at 0600  ? finasteride (PROSCAR) 5 MG tablet TAKE 1 TABLET BY MOUTH DAILY 90 tablet 3 02/28/2022  ? lisinopril (ZESTRIL) 40 MG tablet Take 40 mg by mouth daily.   02/28/2022 at 0600  ? lovastatin (MEVACOR) 20 MG tablet Take 20 mg by mouth at bedtime.   02/27/2022 at 2000  ? tamsulosin (FLOMAX) 0.4 MG CAPS capsule Take 1 capsule (0.4 mg total) by mouth daily. 90 capsule 3 02/28/2022  ? traZODone (DESYREL) 50 MG tablet Take 50 mg by mouth at bedtime.   02/27/2022 at 2000  ? sildenafil (VIAGRA) 100 MG tablet Take 1 tablet (100 mg total) by mouth daily as needed for erectile dysfunction. 30 tablet 0 prn at prn  ? ? ?  Social History  ? ?Socioeconomic History  ? Marital status: Married  ?  Spouse name: Not on file  ? Number of children: Not on file  ? Years of education: Not on file  ? Highest education level: Not on file  ?Occupational History  ? Not on file  ?Tobacco Use  ? Smoking status: Former  ? Smokeless tobacco: Never  ? Tobacco comments:  ?  quit 40 years ago  ?Substance and Sexual Activity  ? Alcohol use: Yes  ?  Alcohol/week: 4.0 standard drinks  ?  Types: 4 Cans of beer per week  ?  Comment: occasional 4 cans of beer/wkly  ? Drug use: Never  ? Sexual activity: Not on file  ?Other Topics Concern  ? Not on file  ?Social History Narrative  ? ** Merged  History Encounter **  ?    ? ?Social Determinants of Health  ? ?Financial Resource Strain: Not on file  ?Food Insecurity: Not on file  ?Transportation Needs: Not on file  ?Physical Activity: Not on file  ?Stress: Not on file  ?Social Connections: Not on file  ?Intimate Partner Violence: Not on file  ?  ?Family History  ?Problem Relation Age of Onset  ? Stroke Father   ? CVA Father   ? Diabetes Mellitus II Father   ? Prostate cancer Neg Hx   ? Kidney disease Neg Hx   ? Kidney cancer Neg Hx   ? Bladder Cancer Neg Hx   ?  ?PHYSICAL EXAM ?General: Pleasant conversational elderly Caucasian male sitting at incline in PCU bed in no distress.  ?HEENT:  Normocephalic and atraumatic. ?Neck:  No JVD.  ?Lungs: Normal respiratory effort on room air.  Clear to ascultation bilaterally.  ?Heart: HRRR . Normal S1 and S2 without gallops or murmurs. Radial & DP pulses 2+ bilaterally. ?Abdomen: Non-distended appearing.  ?Msk: Normal strength and tone for age. ?Extremities: Warm and well perfused. No clubbing, cyanosis.  Trace bilateral lower extremity edema to the top of his sock.  ?Neuro: Alert and oriented X 3. ?Psych:  Answers questions appropriately.  ? ?Labs: ?  ?Lab Results  ?Component Value Date  ? WBC 7.4 03/02/2022  ? HGB 9.7 (L) 03/02/2022  ? HCT 28.5 (L) 03/02/2022  ? MCV 94.4 03/02/2022  ? PLT 113 (L) 03/02/2022  ?  ?Recent Labs  ?Lab 03/02/22 ?0703  ?NA 136  ?K 3.9  ?CL 102  ?CO2 26  ?BUN 43*  ?CREATININE 2.02*  ?CALCIUM 8.5*  ?PROT 6.0*  ?BILITOT 1.0  ?ALKPHOS 39  ?ALT 7  ?AST 14*  ?GLUCOSE 129*  ? ? ?No results found for: CKTOTAL, CKMB, CKMBINDEX, TROPONINI  ?Lab Results  ?Component Value Date  ? CHOL 109 02/28/2022  ? ?Lab Results  ?Component Value Date  ? HDL 30 (L) 02/28/2022  ? ?Lab Results  ?Component Value Date  ? [place] 69 02/28/2022  ? ?Lab Results  ?Component Value Date  ? TRIG 49 02/28/2022  ? ?Lab Results  ?Component Value Date  ? CHOLHDL 3.6 02/28/2022  ? ?No results found for: LDLDIRECT  ?  ?Radiology: DG  Chest 2 View ? ?Result Date: 02/28/2022 ?CLINICAL DATA:  Dist via EXAM: CHEST - 2 VIEW COMPARISON:  05/05/2018 FINDINGS: The lungs are symmetrically well inflated. Small bilateral pleural effusions are present. Mild perihilar interstitial thickening has developed suggestive of a perihilar interstitial pulmonary edema. Together, the findings may reflect changes of mild cardiogenic failure. No pneumothorax. Cardiac size within normal limits. Multiple healed left  rib fractures are identified. No acute bone abnormality. IMPRESSION: Mild perihilar interstitial pulmonary edema, possibly cardiogenic in nature. Small bilateral pleural effusions. Electronically Signed   By: Fidela Salisbury M.D.   On: 02/28/2022 21:07  ? ?PERIPHERAL VASCULAR CATHETERIZATION ? ?Result Date: 02/26/2022 ?See surgical note for result. ? ?ECHOCARDIOGRAM COMPLETE ? ?Result Date: 03/01/2022 ?   ECHOCARDIOGRAM REPORT   Patient Name:   Xavier Davis Date of Exam: 03/01/2022 Medical Rec #:  [id number]       Height:       67.0 in Accession #:    [id number]      Weight:       142.9 lb Date of Birth:  04/30/1941       BSA:          1.753 m? Patient Age:    61 years        BP:           134/66 mmHg Patient Gender: M               HR:           107 bpm. Exam Location:  [facility] Procedure: 2D Echo, Cardiac Doppler and Color Doppler Indications:     NSTEMI I21.4  History:         Patient has prior history of Echocardiogram examinations, most                  recent 04/21/2018. CAD, Arrythmias:Atrial Fibrillation; Risk                  Factors:Hypertension and Dyslipidemia.  Sonographer:     Sherrie Sport Referring Phys:  [id number] [place] Fabrice Dyal Diagnosing Phys: Isaias Cowman MD  Sonographer Comments: Suboptimal apical window and suboptimal subcostal window. IMPRESSIONS  1. Left ventricular ejection fraction, by estimation, is 60 to 65%. The left ventricle has normal function. The left ventricle has no regional wall motion abnormalities. Indeterminate diastolic  filling due to E-A fusion.  2. Right ventricular systolic function is normal. The right ventricular size is normal.  3. The mitral valve is normal in structure. Mild to moderate mitral valve regurgitation. N

## 2022-03-02 NOTE — Progress Notes (Signed)
Patient in Afib rate 80-120s. Notified MD.  ?

## 2022-03-02 NOTE — Progress Notes (Signed)
?Progress Note ? ? ?Patient: Xavier Davis JJH:[id number] DOB: 11/02/1941 DOA: 02/28/2022     2  ? ?DOS: the patient was seen and examined on 03/02/2022 ?  ?Brief hospital course: ?This 81 years old male with PMH significant for anxiety, atrial fibrillation, BPH, carotid artery stenosis s/p stent, elevated PSA, hypertension, hyperlipidemia presented in the ED with  cough, shortness of breath which is progressively getting worse. Patient was discharged on 02/27/2022 after receiving right ICA stent on 02/22/2022 for carotid artery stenosis.  Patient tolerated procedure well.  Patient is admitted for CHF exacerbation and possible NSTEMI given elevated troponins.  Cardiology is consulted recommended to continue heparin gtt. elevated troponin could be demand ischemia in the setting of A-fib with RVR.  No plans for any ischemic evaluation at this time. ? ?Assessment and Plan: ?* NSTEMI (non-ST elevated myocardial infarction) ([place]) ?Patient presented with progressive shortness of breath, cough and intermittent chest pain. He presented with symptoms which are angina equivalent. ?BNP 1303, troponin 577> 567>566> 435 ?Patient initiated on heparin gtt. for NSTEMI. ?Cardiology consulted- Dr. Saralyn Pilar.  ?Cardiology thinks demand ischemia in the setting of A-fib with RVR. ?Patient continued on aspirin, Plavix, Coreg, lisinopril, statin therapy with Crestor, heparin GTT. ?Last echo LVEF 60 to 65%. ?Repeat echocardiogram shows LVEF 60 to 65%, no regional wall motion abnormalities. ? ? ?Acute on chronic diastolic CHF (congestive heart failure) ([place]) ?BNP 1303>545  chest x-ray shows congestion. ?Continue Lasix 40 mg IV every 12 hours.   ?Monitor daily weight, intake output charting. ?Echocardiogram shows LVEF 55 to 60%. ? ? ?Intake/Output Summary (Last 24 hours) at 03/02/2022 1204 ?Last data filed at 03/02/2022 1020 ?Gross per 24 hour  ?Intake 1118.38 ml  ?Output 1450 ml  ?Net -331.62 ml  ? ? ?CKD (chronic kidney disease) stage 3, GFR  30-59 ml/min ?Baseline creatinine around 1.9 - 2.2 ?Creatinine around baseline. ?Hold lisinopril. Continue gentle hydration. ? ?Paroxysmal atrial fibrillation ([place]) ?Patient remains in sinus rhythm. Patient started on heparin gtt. ?Continue Plavix and aspirin as per primary cardiologist. ?Coreg changed with metoprolol for heart rate control. ?2D echocardiogram showed LVEF 60 to 65%, no regional wall motion abnormalities. ?Continue heparin gtt. plan is to change to DOAC before discharge ? ?Hypertension ?Continue Coreg, amlodipine, as needed hydralazine. ?Lisinopril hold due to CKD. ? ? ?Bilateral carotid artery stenosis ?Status post stent, continue aspirin and Plavix as prescribed. ? ? ?Subjective: Patient was seen and examined at bedside.  Overnight events noted.   ?Patient reports feeling better, he was sitting comfortably on the chair, denies any chest pain.   ?He still reports having mild cough and shortness of breath but much improved. ? ? ?Physical Exam: ?Vitals:  ? 03/02/22 0053 03/02/22 0429 03/02/22 0823 03/02/22 1153  ?BP: (!) 102/53 (!) 105/54 (!) 119/55 (!) 110/51  ?Pulse: (!) 55 (!) 56 (!) 56 (!) 52  ?Resp: '18 17 19 18  '$ ?Temp: 98.5 ?F (36.9 ?C) 98.3 ?F (36.8 ?C) 98.4 ?F (36.9 ?C) 98.1 ?F (36.7 ?C)  ?TempSrc: Oral Oral Oral   ?SpO2: 92% 92% 97% 95%  ?Weight:      ?Height:      ? ?General exam: Appears comfortable, not in any acute distress. ?Respiratory system: CTA bilaterally, respiratory effort normal, RR 15 ?Cardiovascular system: S1-S2 heard, regular rate and rhythm, no murmur ?Gastrointestinal system: Abdomen is soft, non tender, non distended, BS+ ?Central nervous system: Alert, oriented x 3, no focal neurological deficits ?Extremities: Edema+, no cyanosis, no clubbing ?Psychiatry: Mood, insight, judgment normal ? ? ?  Data Reviewed: ?I have Reviewed nursing notes, Vitals, and Lab results since pt's last encounter. Pertinent lab results CBC, BMP ?I have ordered test including CBC, BMP, BNP ?I have  reviewed the last note from cardiologist,  ?I have discussed pt's care plan and test results with patient and family.  ? ?Family Communication: Wife at bedside ? ?Disposition: ?Status is: Inpatient ?Remains inpatient appropriate because: Admitted for shortness of breath,  found to have NSTEMI and CHF exacerbation,  A-fib with RVR.  Cardiology consulted and started on heparin gtt. ? ? Planned Discharge Destination: Home ? ? ? ? ?Time spent: 35 minutes ? ?Author: ?Shawna Clamp, MD ?03/02/2022 12:08 PM ? ?For on call review [email or web address].  ?

## 2022-03-02 NOTE — Care Management Important Message (Signed)
Important Message ? ?Patient Details  ?Name: Xavier Davis ?MRN: [id number] ?Date of Birth: 25-Jun-1941 ? ? ?Medicare Important Message Given:  N/A - LOS <3 / Initial given by admissions ? ? ? ? ?Dannette Barbara ?03/02/2022, 8:56 AM ?

## 2022-03-02 NOTE — Plan of Care (Signed)
?  Problem: Education: ?Goal: Knowledge of General Education information will improve ?Description: Including pain rating scale, medication(s)/side effects and non-pharmacologic comfort measures ?Outcome: Progressing ?  ?Problem: Clinical Measurements: ?Goal: Ability to maintain clinical measurements within normal limits will improve ?Outcome: Progressing ?  ?Problem: Clinical Measurements: ?Goal: Respiratory complications will improve ?Outcome: Progressing ?  ?Problem: Clinical Measurements: ?Goal: Cardiovascular complication will be avoided ?Outcome: Progressing ?  ?Problem: Activity: ?Goal: Risk for activity intolerance will decrease ?Outcome: Progressing ?  ?Problem: Pain Managment: ?Goal: General experience of comfort will improve ?Outcome: Progressing ?  ?Problem: Safety: ?Goal: Ability to remain free from injury will improve ?Outcome: Progressing ?  ?

## 2022-03-02 NOTE — TOC Initial Note (Signed)
Transition of Care (TOC) - Initial/Assessment Note  ? ? ?Patient Details  ?Name: Xavier Davis ?MRN: [id number] ?Date of Birth: [birth year] ? ?Transition of Care (TOC) CM/SW Contact:    ?Laurena Slimmer, RN ?Phone Number: ?03/02/2022, 12:23 PM ? ?Clinical Narrative:                 ?Patient is from home admitted for NSTEMI. Will return home at discharge. Does not have insurance for medication. Was paying $500-$600 out of pocket. Medication management contacted to see if patient is eligible. Left a message for the medication management case manager.  ? ?  ?  ? ? ?Patient Goals and CMS Choice ?  ?  ?  ? ?Expected Discharge Plan and Services ?  ?  ?  ?  ?  ?                ?  ?  ?  ?  ?  ?  ?  ?  ?  ?  ? ?Prior Living Arrangements/Services ?  ?  ?  ?       ?  ?  ?  ?  ? ?Activities of Daily Living ?Home Assistive Devices/Equipment: Eyeglasses ?ADL Screening (condition at time of admission) ?Patient's cognitive ability adequate to safely complete daily activities?: Yes ?Is the patient deaf or have difficulty hearing?: No ?Does the patient have difficulty seeing, even when wearing glasses/contacts?: No ?Does the patient have difficulty concentrating, remembering, or making decisions?: No ?Patient able to express need for assistance with ADLs?: Yes ?Does the patient have difficulty dressing or bathing?: No ?Independently performs ADLs?: Yes (appropriate for developmental age) ?Does the patient have difficulty walking or climbing stairs?: No ?Weakness of Legs: None ?Weakness of Arms/Hands: None ? ?Permission Sought/Granted ?  ?  ?   ?   ?   ?   ? ?Emotional Assessment ?  ?  ?  ?  ?  ?  ? ?Admission diagnosis:  Shortness of breath [R06.02] ?NSTEMI (non-ST elevated myocardial infarction) ([place]) [I21.4] ?Patient Active Problem List  ? Diagnosis Date Noted  ? Acute on chronic diastolic CHF (congestive heart failure) ([place]) 03/01/2022  ? NSTEMI (non-ST elevated myocardial infarction) ([place]) 02/28/2022  ? Carotid stenosis, right  02/26/2022  ? Combined forms of age-related cataract, bilateral 12/26/2021  ? Benign neoplasm of colon 01/13/2020  ? Elevated prostate specific antigen (PSA) 01/13/2020  ? Insomnia 01/13/2020  ? Other ill-defined and unknown causes of morbidity and mortality 01/13/2020  ? Psychosexual dysfunction with inhibited sexual excitement 01/13/2020  ? Status post vasectomy 01/13/2020  ? DDD (degenerative disc disease), lumbar 07/30/2018  ? Anxiety 07/28/2018  ? Carotid stenosis 07/28/2018  ? CKD (chronic kidney disease) stage 3, GFR 30-59 ml/min 07/28/2018  ? Elevated blood sugar 07/28/2018  ? Occlusion and stenosis of carotid artery 07/28/2018  ? Paroxysmal atrial fibrillation ([place]) 07/03/2018  ? Leg swelling 07/03/2018  ? Fall from ladder 04/16/2018  ? Bilateral carotid artery stenosis 10/23/2016  ? Hypertension 10/23/2016  ? Hyperlipidemia 10/23/2016  ? Incomplete bladder emptying 07/02/2015  ? Benign prostatic hyperplasia 06/23/2015  ? ?PCP:  Tracie Harrier, MD ?Pharmacy:   ?[facility] - [place], [place] ?[address] ?[place] [address] ?Phone: [phone] Fax: [phone] ? ?[facility] [id number] [place] [address] ?[place] [address] ?Phone: [phone] Fax: [phone] ? ? ? ? ?Social Determinants of Health (SDOH) Interventions ?  ? ?  Readmission Risk Interventions ?   ? View : No data to display.  ?  ?  ?  ? ? ? ?

## 2022-03-02 NOTE — Progress Notes (Signed)
ANTICOAGULATION CONSULT NOTE - Initial Consult ? ?Pharmacy Consult for Heparin  ?Indication: chest pain/ACS ? ?Allergies  ?Allergen Reactions  ? Metoprolol   ?  Other reaction(s): DROWSINESS ? ?Reviewed with patient and wife 03/01/22 - they do not remember patient having any reaction to metoprolol tartrate. In fact, the patient receive IV metoprolol during his hospitalization in 2019 without apparent side effects.   ? Metoprolol Tartrate   ?  Other reaction(s): Other (See Comments)  ? ? ?Patient Measurements: ?Height: '5\' 7"'$  (170.2 cm) ?Weight: 64.8 kg (142 lb 13.7 oz) ?IBW/kg (Calculated) : 66.1 ?Heparin Dosing Weight: 68 kg  ? ?Vital Signs: ?Temp: 98.5 ?F (36.9 ?C) (04/28 0053) ?Temp Source: Oral (04/28 0053) ?BP: 102/53 (04/28 0053) ?Pulse Rate: 55 (04/28 0053) ? ?Labs: ?Recent Labs  ?  02/27/22 ?7564 02/28/22 ?2041 02/28/22 ?2049 02/28/22 ?2255 03/01/22 ?3329 03/01/22 ?1523 03/02/22 ?0013  ?HGB 9.7* 10.9*  --   --   --   --   --   ?HCT 29.0* 32.4*  --   --   --   --   --   ?PLT 113* 115*  --   --   --   --   --   ?APTT  --   --  32  --   --   --   --   ?LABPROT  --  14.2  --   --   --   --   --   ?INR  --  1.1  --   --   --   --   --   ?HEPARINUNFRC  --   --   --   --  0.21* 0.27* 0.11*  ?CREATININE 2.25* 1.92*  --   --   --   --   --   ?TROPONINIHS  --  577*  --  565*  --   --   --   ? ? ? ?Estimated Creatinine Clearance: 28.1 mL/min (A) (by C-G formula based on SCr of 1.92 mg/dL (H)). ? ? ?Medical History: ?Past Medical History:  ?Diagnosis Date  ? Anxiety   ? Atrial fibrillation ([place])   ? BPH (benign prostatic hyperplasia)   ? Carotid artery disease ([place])   ? s/p left carotid endarterectomy at [facility] in 2014  ? Carotid stenosis   ? bilateral  ? CKD (chronic kidney disease)   ? Elevated PSA   ? Fracture, thoracic vertebra ([place]) 05/06/2018  ? T3,T9 endplate fracture  ? Hemothorax on left 05/06/2018  ? HLD (hyperlipidemia)   ? HTN (hypertension)   ? Hyperlipidemia   ? Hypertension   ? Insomnia   ? Rib fractures  05/06/2018  ? Weak urinary stream   ? ? ?Medications:  ?Medications Prior to Admission  ?Medication Sig Dispense Refill Last Dose  ? amLODipine (NORVASC) 10 MG tablet Take 10 mg by mouth daily.   02/28/2022 at 0600  ? aspirin EC 81 MG EC tablet Take 1 tablet (81 mg total) by mouth daily at 6 (six) AM. Swallow whole. 30 tablet 11 02/28/2022 at 0600  ? carvedilol (COREG) 3.125 MG tablet Take 3.125 mg by mouth 2 (two) times daily with a meal.   02/28/2022  ? citalopram (CELEXA) 40 MG tablet Take 40 mg by mouth daily.   02/28/2022 at 0600  ? clopidogrel (PLAVIX) 75 MG tablet Take 1 tablet (75 mg total) by mouth daily at 6 (six) AM. 30 tablet 11 02/28/2022 at 0600  ? finasteride (PROSCAR) 5 MG tablet TAKE 1  TABLET BY MOUTH DAILY 90 tablet 3 02/28/2022  ? lisinopril (ZESTRIL) 40 MG tablet Take 40 mg by mouth daily.   02/28/2022 at 0600  ? lovastatin (MEVACOR) 20 MG tablet Take 20 mg by mouth at bedtime.   02/27/2022 at 2000  ? tamsulosin (FLOMAX) 0.4 MG CAPS capsule Take 1 capsule (0.4 mg total) by mouth daily. 90 capsule 3 02/28/2022  ? traZODone (DESYREL) 50 MG tablet Take 50 mg by mouth at bedtime.   02/27/2022 at 2000  ? sildenafil (VIAGRA) 100 MG tablet Take 1 tablet (100 mg total) by mouth daily as needed for erectile dysfunction. 30 tablet 0 prn at prn  ? ? ?Assessment: ?Pharmacy consulted to dose heparin in this 81 year old male admitted with ACS/NSTEMI.  No prior anticoag noted.   ?CrCl = 28.1 ml/min ? ?Goal of Therapy:  ?Heparin level 0.3-0.7 units/ml ?Monitor platelets by anticoagulation protocol: Yes ?  ?Plan:  ?4/28:  HL @ 0013 = 0.11, subtherapeutic ?Will order heparin 2000 units IV X 1 bolus and increase drip rate to 1300 units/hr. ?Will recheck HL 8 hrs after rate change.  ? ?Jarriel Papillion D ?Clinical Pharmacist   ?03/02/2022,1:18 AM ? ? ?

## 2022-03-02 NOTE — TOC Progression Note (Signed)
Transition of Care (TOC) - Progression Note  ? ? ?Patient Details  ?Name: Xavier Davis ?MRN: [id number] ?Date of Birth: June 12, 1941 ? ?Transition of Care (TOC) CM/SW Contact  ?Laurena Slimmer, RN ?Phone Number: ?03/02/2022, 3:29 PM ? ?Clinical Narrative:    ?Spoke with Inez Catalina from Medication management. She plans to reach out to the patient for financial assistance to secure Eliquis.  ? ? ?  ?  ? ?Expected Discharge Plan and Services ?  ?  ?  ?  ?  ?                ?  ?  ?  ?  ?  ?  ?  ?  ?  ?  ? ? ?Social Determinants of Health (SDOH) Interventions ?  ? ?Readmission Risk Interventions ?   ? View : No data to display.  ?  ?  ?  ? ? ?

## 2022-03-02 NOTE — Progress Notes (Addendum)
ANTICOAGULATION CONSULT NOTE ? ?Pharmacy Consult for IV Heparin  ?Indication: chest pain/ACS ? ?Patient Measurements: ?Height: '5\' 7"'$  (170.2 cm) ?Weight: 64.8 kg (142 lb 13.7 oz) ?IBW/kg (Calculated) : 66.1 ?Heparin Dosing Weight: 68 kg  ? ?Labs: ?Recent Labs  ?  02/28/22 ?2041 02/28/22 ?2049 02/28/22 ?2255 03/01/22 ?3151 03/01/22 ?1523 03/02/22 ?0013 03/02/22 ?0703 03/02/22 ?7616 03/02/22 ?0940  ?HGB 10.9*  --   --   --   --   --  9.7*  --   --   ?HCT 32.4*  --   --   --   --   --  28.5*  --   --   ?PLT 115*  --   --   --   --   --  113*  --   --   ?APTT  --  32  --   --   --   --   --   --   --   ?LABPROT 14.2  --   --   --   --   --   --   --   --   ?INR 1.1  --   --   --   --   --   --   --   --   ?HEPARINUNFRC  --   --   --    < > 0.27* 0.11*  --   --  0.50  ?CREATININE 1.92*  --   --   --   --   --  2.02*  --   --   ?TROPONINIHS 577*  --  565*  --   --   --  466* 435*  --   ? < > = values in this interval not displayed.  ? ? ? ?Estimated Creatinine Clearance: 26.7 mL/min (A) (by C-G formula based on SCr of 2.02 mg/dL (H)). ? ? ?Medical History: ?Past Medical History:  ?Diagnosis Date  ? Anxiety   ? Atrial fibrillation ([place])   ? BPH (benign prostatic hyperplasia)   ? Carotid artery disease ([place])   ? s/p left carotid endarterectomy at [facility] in 2014  ? Carotid stenosis   ? bilateral  ? CKD (chronic kidney disease)   ? Elevated PSA   ? Fracture, thoracic vertebra ([place]) 05/06/2018  ? T3,T9 endplate fracture  ? Hemothorax on left 05/06/2018  ? HLD (hyperlipidemia)   ? HTN (hypertension)   ? Hyperlipidemia   ? Hypertension   ? Insomnia   ? Rib fractures 05/06/2018  ? Weak urinary stream   ? ?Assessment: ?Patient is an 81 y/o M with medical history as above and including history of Afib not on anticoagulation and bilateral carotid stenosis s/p recent right ICA stent placement on 02/26/22 for which he is on ASA / Plavix who is admitted with NSTEMI. Cardiology consulted who feels demand ischemia in setting of Afib with RVR.  Pharmacy has been consulted to manage heparin infusion for ACS with secondary indication for Afib. ? ?CBC notable for stable anemia and thrombocytopenia. ? ?Goal of Therapy:  ?Heparin level 0.3-0.7 units/ml ?Monitor platelets by anticoagulation protocol: Yes ?  ?Plan:  ?--Heparin level is therapeutic x 1 ?--Continue heparin infusion at 1300 units/hr ?--Re-check confirmatory HL in 8 hours ?--Daily CBC per protocol while on IV heparin ?--Follow-up anticoagulation decision per cardiology (when to switch to apixaban) and continued duration of DAPT (x 2 weeks and then continue Plavix as monotherapy with apixaban) ? ?Xavier Davis ?03/02/2022,10:46 AM ? ? ?

## 2022-03-03 DIAGNOSIS — I214 Non-ST elevation (NSTEMI) myocardial infarction: Secondary | ICD-10-CM | POA: Diagnosis not present

## 2022-03-03 LAB — BASIC METABOLIC PANEL
Anion gap: 8 (ref 5–15)
BUN: 47 mg/dL — ABNORMAL HIGH (ref 8–23)
CO2: 26 mmol/L (ref 22–32)
Calcium: 8.8 mg/dL — ABNORMAL LOW (ref 8.9–10.3)
Chloride: 104 mmol/L (ref 98–111)
Creatinine, Ser: 1.92 mg/dL — ABNORMAL HIGH (ref 0.61–1.24)
GFR, Estimated: 35 mL/min — ABNORMAL LOW (ref >60)
Glucose, Bld: 128 mg/dL — ABNORMAL HIGH (ref 70–99)
Potassium: 3.7 mmol/L (ref 3.5–5.1)
Sodium: 138 mmol/L (ref 135–145)

## 2022-03-03 LAB — CBC
HCT: 30.2 % — ABNORMAL LOW (ref 39.0–52.0)
Hemoglobin: 10.7 g/dL — ABNORMAL LOW (ref 13.0–17.0)
MCH: 33 pg (ref 26.0–34.0)
MCHC: 35.4 g/dL (ref 30.0–36.0)
MCV: 93.2 fL (ref 80.0–100.0)
Platelets: 125 10*3/uL — ABNORMAL LOW (ref 150–400)
RBC: 3.24 MIL/uL — ABNORMAL LOW (ref 4.22–5.81)
RDW: 11.6 % (ref 11.5–15.5)
WBC: 6.6 10*3/uL (ref 4.0–10.5)
nRBC: 0 % (ref 0.0–0.2)

## 2022-03-03 MED ORDER — ATORVASTATIN CALCIUM 40 MG PO TABS: 40.0000 mg | ORAL_TABLET | Freq: Every day | ORAL | 1 refills | Status: DC

## 2022-03-03 MED ORDER — APIXABAN 2.5 MG PO TABS
2.5000 mg | ORAL_TABLET | Freq: Two times a day (BID) | ORAL | 2 refills | Status: AC
Start: 2022-03-03 — End: ?

## 2022-03-03 MED ORDER — APIXABAN 2.5 MG PO TABS
2.5000 mg | ORAL_TABLET | Freq: Two times a day (BID) | ORAL | 2 refills | Status: DC
Start: 2022-03-03 — End: 2022-03-03

## 2022-03-03 MED ORDER — NITROGLYCERIN 0.4 MG SL SUBL: 0.4000 mg | SUBLINGUAL_TABLET | SUBLINGUAL | 12 refills | Status: DC | PRN

## 2022-03-03 MED ORDER — ATORVASTATIN CALCIUM 40 MG PO TABS: 40.0000 mg | ORAL_TABLET | Freq: Every day | ORAL | 1 refills | Status: AC

## 2022-03-03 MED ORDER — METOPROLOL TARTRATE 25 MG PO TABS: 12.5000 mg | ORAL_TABLET | Freq: Two times a day (BID) | ORAL | 1 refills | Status: DC

## 2022-03-03 NOTE — Discharge Instructions (Signed)
Advised to follow-up with primary care physician in 1 week. ?Advised to follow-up with cardiology as scheduled. ?Advised to take Eliquis 2.5 mg twice daily for A-fib. ?Coreg was discontinued and metoprolol 12.5 mg was started for heart rate control. ?Lisinopril is discontinued because of worsening renal functions.  Resume lisinopril once renal functions improved. ?

## 2022-03-03 NOTE — Progress Notes (Signed)
Discharge instructions reviewed with patient including followup visits and new medications.  Understanding was verbalized and all questions were answered.  IV removed without complication; patient tolerated well.  Patient discharged home via wheelchair in stable condition escorted by nursing staff.  

## 2022-03-03 NOTE — Plan of Care (Signed)
?  Problem: Education: ?Goal: Knowledge of General Education information will improve ?Description: Including pain rating scale, medication(s)/side effects and non-pharmacologic comfort measures ?Outcome: Progressing ?  ?Problem: Clinical Measurements: ?Goal: Ability to maintain clinical measurements within normal limits will improve ?Outcome: Progressing ?Goal: Will remain free from infection ?Outcome: Progressing ?Goal: Respiratory complications will improve ?Outcome: Progressing ?Goal: Cardiovascular complication will be avoided ?Outcome: Progressing ?  ?Problem: Activity: ?Goal: Risk for activity intolerance will decrease ?Outcome: Progressing ?  ?Problem: Coping: ?Goal: Level of anxiety will decrease ?Outcome: Progressing ?  ?Problem: Elimination: ?Goal: Will not experience complications related to bowel motility ?Outcome: Progressing ?Goal: Will not experience complications related to urinary retention ?Outcome: Progressing ?  ?

## 2022-03-03 NOTE — Discharge Summary (Signed)
Physician Discharge Summary   Patient: Xavier Davis MRN: [id number] DOB: 1941-06-23  Admit date:     02/28/2022  Discharge date: 03/03/22  Discharge Physician: Cipriano Bunker   PCP: Barbette Reichmann, MD   Recommendations at discharge:  Advised to follow-up with primary care physician in 1 week. Advised to follow-up with cardiology as scheduled. Advised to take Eliquis 2.5 mg twice daily for A-fibrillation. Coreg was discontinued and metoprolol 12.5 mg was started for heart rate control. Lisinopril is discontinued because of worsening renal functions.  Resume lisinopril once renal functions improved.  Discharge Diagnoses: Principal Problem:   NSTEMI (non-ST elevated myocardial infarction) [facility]) Active Problems:   Bilateral carotid artery stenosis   Hypertension   Paroxysmal atrial fibrillation (HCC)   CKD (chronic kidney disease) stage 3, GFR 30-59 ml/min   Acute on chronic diastolic CHF (congestive heart failure) (HCC)  Resolved Problems:   * No resolved hospital problems. [facility] Course: This 81 years old male with PMH significant for anxiety, atrial fibrillation, BPH, carotid artery stenosis s/p stent, elevated PSA, hypertension, hyperlipidemia presented in the ED with cough, shortness of breath which is progressively getting worse. Patient was discharged on 02/27/2022 after undergoing right ICA stent on 02/22/2022 for carotid artery stenosis.  Patient tolerated procedure well.  Patient was admitted for CHF exacerbation and possible NSTEMI given elevated troponins.  Cardiology was consulted recommended to continue heparin gtt. elevated troponin could be demand ischemia in the setting of A-fib with RVR.  No plans for any ischemic evaluation at this time.  Patient continued to remains in atrial fibrillation but heart rate well controlled.  Cardizem discontinued,  heparin discontinued,  started on Eliquis.  Patient feels better and cleared from cardiology to be discharged.  Patient  is being discharged home on Eliquis and metoprolol.  Assessment and Plan: * NSTEMI (non-ST elevated myocardial infarction) [facility]) Patient presented with progressive shortness of breath, cough and intermittent chest pain.  He presented with symptoms which are angina equivalent. BNP 1303, troponin 577> 567>566> 435 Patient initiated on heparin gtt. for NSTEMI. Cardiology consulted- Dr. Darrold Junker.  Cardiology thinks demand ischemia in the setting of A-fib with RVR. Patient continued on aspirin, Plavix, Coreg, lisinopril, statin therapy with Crestor, heparin GTT. Last echo LVEF 60 to 65%. Repeat echocardiogram shows LVEF 60 to 65%, no regional wall motion abnormalities. Heparin discontinued.   Acute on chronic diastolic CHF (congestive heart failure) (HCC) BNP 1303>545  chest x-ray shows congestion. Continue Lasix 40 mg IV every 12 hours.   Monitor daily weight, intake output charting. Echocardiogram shows LVEF 55 to 60%.   Intake/Output Summary (Last 24 hours) at 03/02/2022 1204 Last data filed at 03/02/2022 1020 Gross per 24 hour  Intake 1118.38 ml  Output 1450 ml  Net -331.62 ml    CKD (chronic kidney disease) stage 3, GFR 30-59 ml/min Baseline creatinine around 1.9 - 2.2 Creatinine around baseline. Hold lisinopril. Continue gentle hydration.  Paroxysmal atrial fibrillation (HCC) Patient remains in sinus rhythm. Patient started on heparin gtt. Continue Plavix and aspirin as per primary cardiologist. Coreg changed with metoprolol for heart rate control. 2D echocardiogram showed LVEF 60 to 65%, no regional wall motion abnormalities. Anticoagulation changed to Eliquis.  Hypertension Continue metoprolol, amlodipine, as needed hydralazine. Lisinopril hold due to CKD.   Bilateral carotid artery stenosis Status post stent, continue aspirin and Plavix as prescribed.    Pain control - [employer] Controlled Substance Reporting System database was reviewed. and patient was  instructed, not to drive,  operate heavy machinery, perform activities at heights, swimming or participation in water activities or provide baby-sitting services while on Pain, Sleep and Anxiety Medications; until their outpatient Physician has advised to do so again. Also recommended to not to take more than prescribed Pain, Sleep and Anxiety Medications.   Consultants: Cardiology  Procedures performed: Echocardiogram   Disposition: Home health Diet recommendation:  Discharge Diet Orders (From admission, onward)     Start     Ordered   03/03/22 0000  Diet - low sodium heart healthy        03/03/22 1039   03/03/22 0000  Diet Carb Modified        03/03/22 1039           Cardiac diet DISCHARGE MEDICATION: Allergies as of 03/03/2022       Reactions   Metoprolol    Other reaction(s): DROWSINESS Reviewed with patient and wife 03/01/22 - they do not remember patient having any reaction to metoprolol tartrate. In fact, the patient receive IV metoprolol during his hospitalization in 2019 without apparent side effects.    Metoprolol Tartrate    Other reaction(s): Other (See Comments)        Medication List     STOP taking these medications    amLODipine 10 MG tablet Commonly known as: NORVASC   carvedilol 3.125 MG tablet Commonly known as: COREG   lisinopril 40 MG tablet Commonly known as: ZESTRIL   lovastatin 20 MG tablet Commonly known as: MEVACOR       TAKE these medications    apixaban 2.5 MG Tabs tablet Commonly known as: ELIQUIS Take 1 tablet (2.5 mg total) by mouth 2 (two) times daily.   aspirin 81 MG EC tablet Take 1 tablet (81 mg total) by mouth daily at 6 (six) AM. Swallow whole.   atorvastatin 40 MG tablet Commonly known as: LIPITOR Take 1 tablet (40 mg total) by mouth daily. Start taking on: March 04, 2022   citalopram 40 MG tablet Commonly known as: CELEXA Take 40 mg by mouth daily.   clopidogrel 75 MG tablet Commonly known as: PLAVIX Take 1  tablet (75 mg total) by mouth daily at 6 (six) AM.   finasteride 5 MG tablet Commonly known as: PROSCAR TAKE 1 TABLET BY MOUTH DAILY   metoprolol tartrate 25 MG tablet Commonly known as: LOPRESSOR Take 0.5 tablets (12.5 mg total) by mouth 2 (two) times daily.   nitroGLYCERIN 0.4 MG SL tablet Commonly known as: NITROSTAT Place 1 tablet (0.4 mg total) under the tongue every 5 (five) minutes as needed for chest pain.   sildenafil 100 MG tablet Commonly known as: Viagra Take 1 tablet (100 mg total) by mouth daily as needed for erectile dysfunction.   tamsulosin 0.4 MG Caps capsule Commonly known as: FLOMAX Take 1 capsule (0.4 mg total) by mouth daily.   traZODone 50 MG tablet Commonly known as: DESYREL Take 50 mg by mouth at bedtime.        Follow-up Information     Lamar Blinks, MD. Go in 1 week(s).   Specialty: Cardiology Contact information: [address] [facility] [place] [address] [phone]         Barbette Reichmann, MD Follow up in 1 week(s).   Specialty: Internal Medicine Contact information: [address] [place] [address] [phone]                Discharge Exam: Ceasar Mons Weights   02/28/22 2032  02/28/22 2141  Weight: 68 kg 64.8 kg   General exam: Appears comfortable, not in any acute distress. Respiratory system: CTA bilaterally, no wheezing, no crackles. Cardiovascular system: S1-S2 heard, Irregular rhythm, no murmur.  Pacemaker noted. Gastrointestinal system: Abdomen soft, non tender, non distended, BS+ Central nervous system: Alert, oriented x 3, no focal neurological deficits Extremities: No edema, no cyanosis, no clubbing Psychiatry: Mood, insight and judgment normal   Condition at discharge: good  The results of significant diagnostics from this hospitalization (including imaging, microbiology, ancillary and laboratory) are listed below for reference.    Imaging Studies: DG Chest 2 View  Result Date: 02/28/2022 CLINICAL DATA:  Dist via EXAM: CHEST - 2 VIEW COMPARISON:  05/05/2018 FINDINGS: The lungs are symmetrically well inflated. Small bilateral pleural effusions are present. Mild perihilar interstitial thickening has developed suggestive of a perihilar interstitial pulmonary edema. Together, the findings may reflect changes of mild cardiogenic failure. No pneumothorax. Cardiac size within normal limits. Multiple healed left rib fractures are identified. No acute bone abnormality. IMPRESSION: Mild perihilar interstitial pulmonary edema, possibly cardiogenic in nature. Small bilateral pleural effusions. Electronically Signed   By: Helyn Numbers M.D.   On: 02/28/2022 21:07   PERIPHERAL VASCULAR CATHETERIZATION  Result Date: 02/26/2022 See surgical note for result.  ECHOCARDIOGRAM COMPLETE  Result Date: 03/01/2022    ECHOCARDIOGRAM REPORT   Patient Name:   ANTWINE MCKENSIE Date of Exam: 03/01/2022 Medical Rec #:  [id number]       Height:       67.0 in Accession #:    [id number]      Weight:       142.9 lb Date of Birth:  [birth year]       BSA:          1.753 m Patient Age:    80 years        BP:           134/66 mmHg Patient Gender: M               HR:           107 bpm. Exam Location:  [facility] Procedure: 2D Echo, Cardiac Doppler and Color Doppler Indications:     NSTEMI I21.4  History:         Patient has prior history of Echocardiogram examinations, most                  recent 04/21/2018. CAD, Arrythmias:Atrial Fibrillation; Risk                  Factors:Hypertension and Dyslipidemia.  Sonographer:     Cristela Blue Referring Phys:  [id number] [facility] MICHELLE TANG Diagnosing Phys: Marcina Millard MD  Sonographer Comments: Suboptimal apical window and suboptimal subcostal window. IMPRESSIONS  1. Left ventricular ejection fraction, by estimation, is 60 to 65%. The left ventricle has normal function. The left ventricle has no regional wall motion abnormalities.  Indeterminate diastolic filling due to E-A fusion.  2. Right ventricular systolic function is normal. The right ventricular size is normal.  3. The mitral valve is normal in structure. Mild to moderate mitral valve regurgitation. No evidence of mitral stenosis.  4. The aortic valve is normal in structure. Aortic valve regurgitation is not visualized. No aortic stenosis is present.  5. The inferior vena cava is normal in size with greater than 50% respiratory variability, suggesting right atrial pressure of 3 mmHg. FINDINGS  Left Ventricle: Left ventricular ejection fraction, by estimation, is 60 to  65%. The left ventricle has normal function. The left ventricle has no regional wall motion abnormalities. The left ventricular internal cavity size was normal in size. There is  no left ventricular hypertrophy. Indeterminate diastolic filling due to E-A fusion. Right Ventricle: The right ventricular size is normal. No increase in right ventricular wall thickness. Right ventricular systolic function is normal. Left Atrium: Left atrial size was normal in size. Right Atrium: Right atrial size was normal in size. Pericardium: There is no evidence of pericardial effusion. Mitral Valve: The mitral valve is normal in structure. Mild to moderate mitral valve regurgitation. No evidence of mitral valve stenosis. Tricuspid Valve: The tricuspid valve is normal in structure. Tricuspid valve regurgitation is mild . No evidence of tricuspid stenosis. Aortic Valve: The aortic valve is normal in structure. Aortic valve regurgitation is not visualized. No aortic stenosis is present. Aortic valve mean gradient measures 3.0 mmHg. Aortic valve peak gradient measures 5.6 mmHg. Aortic valve area, by VTI measures 2.94 cm. Pulmonic Valve: The pulmonic valve was normal in structure. Pulmonic valve regurgitation is not visualized. No evidence of pulmonic stenosis. Aorta: The aortic root is normal in size and structure. Venous: The inferior vena  cava is normal in size with greater than 50% respiratory variability, suggesting right atrial pressure of 3 mmHg. IAS/Shunts: No atrial level shunt detected by color flow Doppler.  LEFT VENTRICLE PLAX 2D LVIDd:         4.40 cm LVIDs:         2.80 cm LV PW:         1.10 cm LV IVS:        1.00 cm LVOT diam:     2.00 cm LV SV:         62 LV SV Index:   35 LVOT Area:     3.14 cm  RIGHT VENTRICLE RV Basal diam:  4.10 cm RV S prime:     13.40 cm/s TAPSE (M-mode): 2.0 cm LEFT ATRIUM            Index        RIGHT ATRIUM           Index LA diam:      4.30 cm  2.45 cm/m   RA Area:     21.40 cm LA Vol (A4C): 118.0 ml 67.32 ml/m  RA Volume:   65.40 ml  37.31 ml/m  AORTIC VALVE AV Area (Vmax):    3.10 cm AV Area (Vmean):   2.96 cm AV Area (VTI):     2.94 cm AV Vmax:           118.50 cm/s AV Vmean:          82.350 cm/s AV VTI:            0.212 m AV Peak Grad:      5.6 mmHg AV Mean Grad:      3.0 mmHg LVOT Vmax:         117.00 cm/s LVOT Vmean:        77.700 cm/s LVOT VTI:          0.198 m LVOT/AV VTI ratio: 0.94  AORTA Ao Root diam: 2.30 cm MITRAL VALVE               TRICUSPID VALVE MV Area (PHT): 9.37 cm    TR Peak grad:   9.7 mmHg MV Decel Time: 81 msec     TR Vmax:        156.00 cm/s  MV E velocity: 99.40 cm/s                            SHUNTS                            Systemic VTI:  0.20 m                            Systemic Diam: 2.00 cm Marcina Millard MD Electronically signed by Marcina Millard MD Signature Date/Time: 03/01/2022/3:50:25 PM    Final     Microbiology: Results for orders placed or performed during the hospital encounter of 02/28/22  Resp Panel by RT-PCR (Flu A&B, Covid) Nasopharyngeal Swab     Status: None   Collection Time: 02/28/22  9:51 PM   Specimen: Nasopharyngeal Swab; Nasopharyngeal(NP) swabs in vial transport medium  Result Value Ref Range Status   SARS Coronavirus 2 by RT PCR NEGATIVE NEGATIVE Final    Comment: (NOTE) SARS-CoV-2 target nucleic acids are NOT DETECTED.  The  SARS-CoV-2 RNA is generally detectable in upper respiratory specimens during the acute phase of infection. The lowest concentration of SARS-CoV-2 viral copies this assay can detect is 138 copies/mL. A negative result does not preclude SARS-Cov-2 infection and should not be used as the sole basis for treatment or other patient management decisions. A negative result may occur with  improper specimen collection/handling, submission of specimen other than nasopharyngeal swab, presence of viral mutation(s) within the areas targeted by this assay, and inadequate number of viral copies(<138 copies/mL). A negative result must be combined with clinical observations, patient history, and epidemiological information. The expected result is Negative.  Fact Sheet for Patients:  BloggerCourse.com  Fact Sheet for Healthcare Providers:  SeriousBroker.it  This test is no t yet approved or cleared by the [place] FDA and  has been authorized for detection and/or diagnosis of SARS-CoV-2 by FDA under an Emergency Use Authorization (EUA). This EUA will remain  in effect (meaning this test can be used) for the duration of the COVID-19 declaration under Section 564(b)(1) of the Act, 21 U.S.C.section 360bbb-3(b)(1), unless the authorization is terminated  or revoked sooner.       Influenza A by PCR NEGATIVE NEGATIVE Final   Influenza B by PCR NEGATIVE NEGATIVE Final    Comment: (NOTE) The Xpert Xpress SARS-CoV-2/FLU/RSV plus assay is intended as an aid in the diagnosis of influenza from Nasopharyngeal swab specimens and should not be used as a sole basis for treatment. Nasal washings and aspirates are unacceptable for Xpert Xpress SARS-CoV-2/FLU/RSV testing.  Fact Sheet for Patients: BloggerCourse.com  Fact Sheet for Healthcare Providers: SeriousBroker.it  This test is not yet approved or  cleared by the [place] FDA and has been authorized for detection and/or diagnosis of SARS-CoV-2 by FDA under an Emergency Use Authorization (EUA). This EUA will remain in effect (meaning this test can be used) for the duration of the COVID-19 declaration under Section 564(b)(1) of the Act, 21 U.S.C. section 360bbb-3(b)(1), unless the authorization is terminated or revoked.  Performed at [facility], [address] [place], [place] [address]     Labs: CBC: Recent Labs  Lab 02/27/22 0525 02/28/22 2041 03/02/22 0703 03/03/22 0555  WBC 7.3 11.4* 7.4 6.6  HGB 9.7* 10.9* 9.7* 10.7*  HCT 29.0* 32.4* 28.5* 30.2*  MCV 95.4 94.5 94.4 93.2  PLT 113*  115* 113* 125*   Basic Metabolic Panel: Recent Labs  Lab 02/26/22 0724 02/27/22 0525 02/28/22 2041 03/02/22 0703 03/03/22 0555  NA  --  137 136 136 138  K  --  4.6 4.6 3.9 3.7  CL  --  112* 107 102 104  CO2  --  23 23 26 26   GLUCOSE  --  111* 150* 129* 128*  BUN 46* 40* 38* 43* 47*  CREATININE 1.85* 2.25* 1.92* 2.02* 1.92*  CALCIUM  --  8.0* 8.9 8.5* 8.8*  MG  --   --   --  1.9  --   PHOS  --   --   --  3.2  --    Liver Function Tests: Recent Labs  Lab 03/02/22 0703  AST 14*  ALT 7  ALKPHOS 39  BILITOT 1.0  PROT 6.0*  ALBUMIN 3.4*   CBG: Recent Labs  Lab 02/26/22 1037 03/01/22 2030  GLUCAP 145* 185*    Discharge time spent: greater than 30 minutes.  Signed: Cipriano Bunker, MD [facility] 03/03/2022

## 2022-03-03 NOTE — TOC Progression Note (Addendum)
Transition of Care (TOC) - Progression Note  ? ? ?Patient Details  ?Name: ANTONIA CULBERTSON ?MRN: [id number] ?Date of Birth: 10/21/1941 ? ?Transition of Care (TOC) CM/SW Contact  ?Izola Price, RN ?Phone Number: ?03/03/2022, 10:30 AM ? ?Clinical Narrative:  4/29: Patient has discharge orders today. Floydene Flock contacted this RN CM regarding need for [facility] orders for RN for CHF follow up and SW for medication management. Provider notified and orders received. [employer] notified. No other HH needs. Prior RN CM gave information regarding medication management and HH SW will also assist with this.  Pharmacy has given patient a starter coupon per unit RN when RN CM brought to unit. Simmie Davies RN CM  ? ? ? ?  ?  ? ?Expected Discharge Plan and Services ?  ?  ?  ?  ?  ?Expected Discharge Date: 03/03/22               ?  ?  ?  ?  ?  ?  ?  ?  ?  ?  ? ? ?Social Determinants of Health (SDOH) Interventions ?  ? ?Readmission Risk Interventions ?   ? View : No data to display.  ?  ?  ?  ? ? ?

## 2022-03-26 ENCOUNTER — Other Ambulatory Visit (INDEPENDENT_AMBULATORY_CARE_PROVIDER_SITE_OTHER): Payer: Self-pay | Admitting: Vascular Surgery

## 2022-03-26 DIAGNOSIS — I6523 Occlusion and stenosis of bilateral carotid arteries: Secondary | ICD-10-CM

## 2022-03-27 ENCOUNTER — Encounter (INDEPENDENT_AMBULATORY_CARE_PROVIDER_SITE_OTHER): Payer: Self-pay | Admitting: Nurse Practitioner

## 2022-03-27 ENCOUNTER — Ambulatory Visit (INDEPENDENT_AMBULATORY_CARE_PROVIDER_SITE_OTHER): Payer: Medicare Other

## 2022-03-27 ENCOUNTER — Ambulatory Visit (INDEPENDENT_AMBULATORY_CARE_PROVIDER_SITE_OTHER): Payer: Medicare Other | Admitting: Nurse Practitioner

## 2022-03-27 VITALS — BP 130/76 | HR 123 | Resp 17 | Ht 67.0 in | Wt 144.0 lb

## 2022-03-27 DIAGNOSIS — I10 Essential (primary) hypertension: Secondary | ICD-10-CM

## 2022-03-27 DIAGNOSIS — I6523 Occlusion and stenosis of bilateral carotid arteries: Secondary | ICD-10-CM | POA: Diagnosis not present

## 2022-03-27 DIAGNOSIS — E785 Hyperlipidemia, unspecified: Secondary | ICD-10-CM

## 2022-04-02 ENCOUNTER — Encounter (INDEPENDENT_AMBULATORY_CARE_PROVIDER_SITE_OTHER): Payer: Self-pay | Admitting: Nurse Practitioner

## 2022-04-02 NOTE — Progress Notes (Signed)
Subjective:    Patient ID: Xavier Davis, male    DOB: 10-15-1941, 81 y.o.   MRN: [id number] No chief complaint on file.   The patient is seen for follow up evaluation of carotid stenosis status post carotid stent on 02/26/2022.  The patient has notably been in atrial fibrillation since his surgery, however he does note that he had atrial fibrillation prior to the surgery as well.  It appears to be paroxysmal in nature.  The patient denies neck or incisional pain.  The patient denies interval amaurosis fugax. There is no recent history of TIA symptoms or focal motor deficits. There is no prior documented CVA.  The patient denies headache.  The patient is taking enteric-coated aspirin 81 mg daily.  No recent shortening of the patient's walking distance or new symptoms consistent with claudication.  No history of rest pain symptoms. No new ulcers or wounds of the lower extremities have occurred.  There is no history of DVT, PE or superficial thrombophlebitis. No recent episodes of angina or shortness of breath documented.    Today the right ICA is widely patent with a patent stent.  Patient has a 1 to 39% stenosis of the left ICA.  He has antegrade flow of the bilateral vertebrals with normal flow hemodynamics in the bilateral subclavian arteries.   Review of Systems  All other systems reviewed and are negative.     Objective:   Physical Exam Vitals reviewed.  HENT:     Head: Normocephalic.  Cardiovascular:     Rate and Rhythm: Tachycardia present. Rhythm irregular.     Pulses: Normal pulses.  Pulmonary:     Effort: Pulmonary effort is normal.  Skin:    General: Skin is warm and dry.  Neurological:     Mental Status: He is alert and oriented to person, place, and time.  Psychiatric:        Mood and Affect: Mood normal.        Behavior: Behavior normal.        Thought Content: Thought content normal.        Judgment: Judgment normal.    BP 130/76 (BP Location: Left Arm)    Pulse (!) 123   Resp 17   Ht '5\' 7"'$  (1.702 m)   Wt 144 lb (65.3 kg)   BMI 22.55 kg/m   Past Medical History:  Diagnosis Date   Anxiety    Atrial fibrillation (HCC)    BPH (benign prostatic hyperplasia)    Carotid artery disease (HCC)    s/p left carotid endarterectomy at [facility] in 2014   Carotid stenosis    bilateral   CKD (chronic kidney disease)    Elevated PSA    Fracture, thoracic vertebra ([place]) 05/06/2018   T3,T9 endplate fracture   Hemothorax on left 05/06/2018   HLD (hyperlipidemia)    HTN (hypertension)    Hyperlipidemia    Hypertension    Insomnia    Rib fractures 05/06/2018   Weak urinary stream     Social History   Socioeconomic History   Marital status: Married    Spouse name: Not on file   Number of children: Not on file   Years of education: Not on file   Highest education level: Not on file  Occupational History   Not on file  Tobacco Use   Smoking status: Former   Smokeless tobacco: Never   Tobacco comments:    quit 40 years ago  Substance and Sexual Activity  Alcohol use: Yes    Alcohol/week: 4.0 standard drinks    Types: 4 Cans of beer per week    Comment: occasional 4 cans of beer/wkly   Drug use: Never   Sexual activity: Not on file  Other Topics Concern   Not on file  Social History Narrative   ** Merged History Encounter **       Social Determinants of Health   Financial Resource Strain: Not on file  Food Insecurity: Not on file  Transportation Needs: Not on file  Physical Activity: Not on file  Stress: Not on file  Social Connections: Not on file  Intimate Partner Violence: Not on file    Past Surgical History:  Procedure Laterality Date   APPENDECTOMY     CAROTID PTA/STENT INTERVENTION Right 02/26/2022   Procedure: CAROTID PTA/STENT INTERVENTION;  Surgeon: Algernon Huxley, MD;  Location: [place] [facility];  Service: Cardiovascular;  Laterality: Right;   carotid stenosis Left    ces and stent placement   cataract surgery  Bilateral     Family History  Problem Relation Age of Onset   Stroke Father    CVA Father    Diabetes Mellitus II Father    Prostate cancer Neg Hx    Kidney disease Neg Hx    Kidney cancer Neg Hx    Bladder Cancer Neg Hx     Allergies  Allergen Reactions   Metoprolol     Other reaction(s): DROWSINESS  Reviewed with patient and wife 03/01/22 - they do not remember patient having any reaction to metoprolol tartrate. In fact, the patient receive IV metoprolol during his hospitalization in 2019 without apparent side effects.    Metoprolol Tartrate     Other reaction(s): Other (See Comments)       Latest Ref Rng & Units 03/03/2022    5:55 AM 03/02/2022    7:03 AM 02/28/2022    8:41 PM  CBC  WBC 4.0 - 10.5 K/uL 6.6   7.4   11.4    Hemoglobin 13.0 - 17.0 g/dL 10.7   9.7   10.9    Hematocrit 39.0 - 52.0 % 30.2   28.5   32.4    Platelets 150 - 400 K/uL 125   113   115        CMP     Component Value Date/Time   NA 138 03/03/2022 0555   NA 139 08/11/2013 0406   K 3.7 03/03/2022 0555   K 4.2 08/11/2013 0406   CL 104 03/03/2022 0555   CL 107 08/11/2013 0406   CO2 26 03/03/2022 0555   CO2 29 08/11/2013 0406   GLUCOSE 128 (H) 03/03/2022 0555   GLUCOSE 101 (H) 08/11/2013 0406   BUN 47 (H) 03/03/2022 0555   BUN 18 08/11/2013 0406   CREATININE 1.92 (H) 03/03/2022 0555   CREATININE 1.43 (H) 08/11/2013 0406   CALCIUM 8.8 (L) 03/03/2022 0555   CALCIUM 8.7 08/11/2013 0406   PROT 6.0 (L) 03/02/2022 0703   PROT 7.3 12/25/2012 0856   ALBUMIN 3.4 (L) 03/02/2022 0703   ALBUMIN 3.7 12/25/2012 0856   AST 14 (L) 03/02/2022 0703   AST 20 12/25/2012 0856   ALT 7 03/02/2022 0703   ALT 20 12/25/2012 0856   ALKPHOS 39 03/02/2022 0703   ALKPHOS 87 12/25/2012 0856   BILITOT 1.0 03/02/2022 0703   BILITOT 1.4 (H) 12/25/2012 0856   GFRNONAA 35 (L) 03/03/2022 0555   GFRNONAA 49 (L) 08/11/2013 0406  GFRAA >60 04/25/2018 0255   GFRAA 56 (L) 08/11/2013 0406     No results found.      Assessment & Plan:   1. Bilateral carotid artery stenosis Recommend:  The patient is s/p successful right carotid stent placement  Duplex ultrasound preoperatively shows 1 to 39% contralateral stenosis.  Continue antiplatelet therapy as prescribed Continue management of CAD, HTN and Hyperlipidemia Healthy heart diet,  encouraged exercise at least 4 times per week  Follow up in 3 months with duplex ultrasound and physical exam based on the patient's carotid surgery.  2. Primary hypertension Continue antihypertensive medications as already ordered, these medications have been reviewed and there are no changes at this time.   3. Hyperlipidemia, unspecified hyperlipidemia type Continue statin as ordered and reviewed, no changes at this time    Current Outpatient Medications on File Prior to Visit  Medication Sig Dispense Refill   amLODipine (NORVASC) 10 MG tablet Take 1 tablet by mouth daily.     apixaban (ELIQUIS) 2.5 MG TABS tablet Take 1 tablet (2.5 mg total) by mouth 2 (two) times daily. 60 tablet 2   atorvastatin (LIPITOR) 40 MG tablet Take 1 tablet (40 mg total) by mouth daily. 30 tablet 1   citalopram (CELEXA) 40 MG tablet Take 40 mg by mouth daily.     clopidogrel (PLAVIX) 75 MG tablet Take 1 tablet (75 mg total) by mouth daily at 6 (six) AM. 30 tablet 11   finasteride (PROSCAR) 5 MG tablet TAKE 1 TABLET BY MOUTH DAILY 90 tablet 3   metoprolol tartrate (LOPRESSOR) 25 MG tablet Take 0.5 tablets (12.5 mg total) by mouth 2 (two) times daily. 60 tablet 1   nitroGLYCERIN (NITROSTAT) 0.4 MG SL tablet Place 1 tablet (0.4 mg total) under the tongue every 5 (five) minutes as needed for chest pain. 30 tablet 12   sildenafil (VIAGRA) 100 MG tablet Take 1 tablet (100 mg total) by mouth daily as needed for erectile dysfunction. 30 tablet 0   tamsulosin (FLOMAX) 0.4 MG CAPS capsule Take 1 capsule (0.4 mg total) by mouth daily. 90 capsule 3   traZODone (DESYREL) 50 MG tablet Take 50 mg by  mouth at bedtime.     aspirin EC 81 MG EC tablet Take 1 tablet (81 mg total) by mouth daily at 6 (six) AM. Swallow whole. (Patient not taking: Reported on 03/27/2022) 30 tablet 11   No current facility-administered medications on file prior to visit.    There are no Patient Instructions on file for this visit. No follow-ups on file.   Kris Hartmann, NP

## 2022-05-03 DIAGNOSIS — R001 Bradycardia, unspecified: Secondary | ICD-10-CM | POA: Insufficient documentation

## 2022-05-03 DIAGNOSIS — R600 Localized edema: Secondary | ICD-10-CM | POA: Insufficient documentation

## 2022-06-28 ENCOUNTER — Other Ambulatory Visit (INDEPENDENT_AMBULATORY_CARE_PROVIDER_SITE_OTHER): Payer: Self-pay | Admitting: Vascular Surgery

## 2022-06-28 DIAGNOSIS — I6523 Occlusion and stenosis of bilateral carotid arteries: Secondary | ICD-10-CM

## 2022-06-29 ENCOUNTER — Ambulatory Visit (INDEPENDENT_AMBULATORY_CARE_PROVIDER_SITE_OTHER): Payer: Medicare Other

## 2022-06-29 ENCOUNTER — Encounter (INDEPENDENT_AMBULATORY_CARE_PROVIDER_SITE_OTHER): Payer: Self-pay | Admitting: Vascular Surgery

## 2022-06-29 ENCOUNTER — Ambulatory Visit (INDEPENDENT_AMBULATORY_CARE_PROVIDER_SITE_OTHER): Payer: Medicare Other | Admitting: Vascular Surgery

## 2022-06-29 VITALS — BP 179/70 | HR 60 | Resp 16 | Ht 67.0 in | Wt 141.0 lb

## 2022-06-29 DIAGNOSIS — Z862 Personal history of diseases of the blood and blood-forming organs and certain disorders involving the immune mechanism: Secondary | ICD-10-CM | POA: Insufficient documentation

## 2022-06-29 DIAGNOSIS — I6523 Occlusion and stenosis of bilateral carotid arteries: Secondary | ICD-10-CM

## 2022-06-29 DIAGNOSIS — I509 Heart failure, unspecified: Secondary | ICD-10-CM | POA: Insufficient documentation

## 2022-06-29 DIAGNOSIS — E785 Hyperlipidemia, unspecified: Secondary | ICD-10-CM

## 2022-06-29 DIAGNOSIS — N189 Chronic kidney disease, unspecified: Secondary | ICD-10-CM | POA: Insufficient documentation

## 2022-06-29 DIAGNOSIS — I4891 Unspecified atrial fibrillation: Secondary | ICD-10-CM | POA: Insufficient documentation

## 2022-06-29 DIAGNOSIS — I10 Essential (primary) hypertension: Secondary | ICD-10-CM | POA: Diagnosis not present

## 2022-06-29 DIAGNOSIS — Z5181 Encounter for therapeutic drug level monitoring: Secondary | ICD-10-CM | POA: Insufficient documentation

## 2022-06-29 DIAGNOSIS — I130 Hypertensive heart and chronic kidney disease with heart failure and stage 1 through stage 4 chronic kidney disease, or unspecified chronic kidney disease: Secondary | ICD-10-CM | POA: Insufficient documentation

## 2022-06-29 DIAGNOSIS — N529 Male erectile dysfunction, unspecified: Secondary | ICD-10-CM | POA: Insufficient documentation

## 2022-06-29 DIAGNOSIS — I6529 Occlusion and stenosis of unspecified carotid artery: Secondary | ICD-10-CM | POA: Insufficient documentation

## 2022-06-29 MED ORDER — CLOPIDOGREL BISULFATE 75 MG PO TABS: 75.0000 mg | ORAL_TABLET | Freq: Every day | ORAL | 2 refills | Status: DC

## 2022-06-29 NOTE — Assessment & Plan Note (Signed)
lipid control important in reducing the progression of atherosclerotic disease. Continue statin therapy  

## 2022-06-29 NOTE — Assessment & Plan Note (Signed)
blood pressure control important in reducing the progression of atherosclerotic disease. On appropriate oral medications.  

## 2022-06-29 NOTE — Progress Notes (Signed)
MRN : [id number]  Xavier Davis is a 81 y.o. (1941/10/07) male who presents with chief complaint of  Chief Complaint  Patient presents with   Follow-up    ultrasound  .  History of Present Illness: Patient returns in follow-up of his carotid disease.  He is doing well today.  About 4 months ago, he underwent right carotid artery stent placement for high-grade stenosis.  He did well from this.  He has previously had a left carotid endarterectomy and then subsequent left carotid artery stent for recurrent stenosis many years ago.  He remains on Plavix and 2.5 mg of Eliquis.  He is also on Lipitor. Duplex today shows patent bilateral carotid artery stents with velocities that would be in the 1 to 39% range on each side.  Current Outpatient Medications  Medication Sig Dispense Refill   amLODipine (NORVASC) 10 MG tablet Take 1 tablet by mouth daily.     apixaban (ELIQUIS) 2.5 MG TABS tablet Take 1 tablet (2.5 mg total) by mouth 2 (two) times daily. 60 tablet 2   atorvastatin (LIPITOR) 40 MG tablet Take 1 tablet (40 mg total) by mouth daily. 30 tablet 1   citalopram (CELEXA) 40 MG tablet Take 40 mg by mouth daily.     clopidogrel (PLAVIX) 75 MG tablet Take 1 tablet (75 mg total) by mouth daily at 6 (six) AM. 30 tablet 11   finasteride (PROSCAR) 5 MG tablet TAKE 1 TABLET BY MOUTH DAILY 90 tablet 3   nitroGLYCERIN (NITROSTAT) 0.4 MG SL tablet Place 1 tablet (0.4 mg total) under the tongue every 5 (five) minutes as needed for chest pain. 30 tablet 12   sildenafil (VIAGRA) 100 MG tablet Take 1 tablet (100 mg total) by mouth daily as needed for erectile dysfunction. 30 tablet 0   tamsulosin (FLOMAX) 0.4 MG CAPS capsule Take 1 capsule (0.4 mg total) by mouth daily. 90 capsule 3   traZODone (DESYREL) 50 MG tablet Take 50 mg by mouth at bedtime.     aspirin EC 81 MG EC tablet Take 1 tablet (81 mg total) by mouth daily at 6 (six) AM. Swallow whole. (Patient not taking: Reported on 03/27/2022) 30 tablet  11   metoprolol tartrate (LOPRESSOR) 25 MG tablet Take 0.5 tablets (12.5 mg total) by mouth 2 (two) times daily. (Patient not taking: Reported on 06/29/2022) 60 tablet 1   No current facility-administered medications for this visit.    Past Medical History:  Diagnosis Date   Anxiety    Atrial fibrillation (HCC)    BPH (benign prostatic hyperplasia)    Carotid artery disease (HCC)    s/p left carotid endarterectomy at [facility] in 2014   Carotid stenosis    bilateral   CKD (chronic kidney disease)    Elevated PSA    Fracture, thoracic vertebra ([place]) 05/06/2018   T3,T9 endplate fracture   Hemothorax on left 05/06/2018   HLD (hyperlipidemia)    HTN (hypertension)    Hyperlipidemia    Hypertension    Insomnia    Rib fractures 05/06/2018   Weak urinary stream     Past Surgical History:  Procedure Laterality Date   APPENDECTOMY     CAROTID PTA/STENT INTERVENTION Right 02/26/2022   Procedure: CAROTID PTA/STENT INTERVENTION;  Surgeon: Algernon Huxley, MD;  Location: [place] [facility];  Service: Cardiovascular;  Laterality: Right;   carotid stenosis Left    ces and stent placement   cataract surgery Bilateral      Social  History   Tobacco Use   Smoking status: Former   Smokeless tobacco: Never   Tobacco comments:    quit 40 years ago  Substance Use Topics   Alcohol use: Yes    Alcohol/week: 4.0 standard drinks of alcohol    Types: 4 Cans of beer per week    Comment: occasional 4 cans of beer/wkly   Drug use: Never      Family History  Problem Relation Age of Onset   Stroke Father    CVA Father    Diabetes Mellitus II Father    Prostate cancer Neg Hx    Kidney disease Neg Hx    Kidney cancer Neg Hx    Bladder Cancer Neg Hx      Allergies  Allergen Reactions   Metoprolol     Other reaction(s): DROWSINESS  Reviewed with patient and wife 03/01/22 - they do not remember patient having any reaction to metoprolol tartrate. In fact, the patient receive IV metoprolol  during his hospitalization in 2019 without apparent side effects.    Metoprolol Tartrate     Other reaction(s): Other (See Comments)     REVIEW OF SYSTEMS (Negative unless checked)  Constitutional: '[]'$ Weight loss  '[]'$ Fever  '[]'$ Chills Cardiac: '[]'$ Chest pain   '[]'$ Chest pressure   '[x]'$ Palpitations   '[]'$ Shortness of breath when laying flat   '[]'$ Shortness of breath at rest   '[]'$ Shortness of breath with exertion. Vascular:  '[]'$ Pain in legs with walking   '[]'$ Pain in legs at rest   '[]'$ Pain in legs when laying flat   '[]'$ Claudication   '[]'$ Pain in feet when walking  '[]'$ Pain in feet at rest  '[]'$ Pain in feet when laying flat   '[]'$ History of DVT   '[]'$ Phlebitis   '[]'$ Swelling in legs   '[]'$ Varicose veins   '[]'$ Non-healing ulcers Pulmonary:   '[]'$ Uses home oxygen   '[]'$ Productive cough   '[]'$ Hemoptysis   '[]'$ Wheeze  '[]'$ COPD   '[]'$ Asthma Neurologic:  '[]'$ Dizziness  '[]'$ Blackouts   '[]'$ Seizures   '[]'$ History of stroke   '[]'$ History of TIA  '[]'$ Aphasia   '[]'$ Temporary blindness   '[]'$ Dysphagia   '[]'$ Weakness or numbness in arms   '[]'$ Weakness or numbness in legs Musculoskeletal:  '[x]'$ Arthritis   '[]'$ Joint swelling   '[]'$ Joint pain   '[]'$ Low back pain Hematologic:  '[]'$ Easy bruising  '[]'$ Easy bleeding   '[]'$ Hypercoagulable state   '[]'$ Anemic  '[]'$ Hepatitis Gastrointestinal:  '[]'$ Blood in stool   '[]'$ Vomiting blood  '[x]'$ Gastroesophageal reflux/heartburn   '[]'$ Difficulty swallowing. Genitourinary:  '[]'$ Chronic kidney disease   '[]'$ Difficult urination  '[]'$ Frequent urination  '[]'$ Burning with urination   '[]'$ Blood in urine Skin:  '[]'$ Rashes   '[]'$ Ulcers   '[]'$ Wounds Psychological:  '[x]'$ History of anxiety   '[]'$  History of major depression.  Physical Examination  Vitals:   06/29/22 1046 06/29/22 1047  BP: (!) 177/70 (!) 179/70  Pulse: 60   Resp: 16   Weight: 141 lb (64 kg)   Height: '5\' 7"'$  (1.702 m)    Body mass index is 22.08 kg/m. Gen:  WD/WN, NAD Head: [place]/AT, No temporalis wasting. Ear/Nose/Throat: Hearing grossly intact, nares w/o erythema or drainage, trachea midline Eyes: Conjunctiva clear.  Sclera non-icteric Neck: Supple.  No bruit  Pulmonary:  Good air movement, equal and clear to auscultation bilaterally.  Cardiac: irregular Vascular:  Vessel Right Left  Radial Palpable Palpable       Musculoskeletal: M/S 5/5 throughout.  No deformity or atrophy. No edema. Neurologic: CN 2-12 intact. Sensation grossly intact in extremities.  Symmetrical.  Speech is fluent. Motor exam as listed  above. Psychiatric: Judgment intact, Mood & affect appropriate for pt's clinical situation. Dermatologic: No rashes or ulcers noted.  No cellulitis or open wounds.     CBC Lab Results  Component Value Date   WBC 6.6 03/03/2022   HGB 10.7 (L) 03/03/2022   HCT 30.2 (L) 03/03/2022   MCV 93.2 03/03/2022   PLT 125 (L) 03/03/2022    BMET    Component Value Date/Time   NA 138 03/03/2022 0555   NA 139 08/11/2013 0406   K 3.7 03/03/2022 0555   K 4.2 08/11/2013 0406   CL 104 03/03/2022 0555   CL 107 08/11/2013 0406   CO2 26 03/03/2022 0555   CO2 29 08/11/2013 0406   GLUCOSE 128 (H) 03/03/2022 0555   GLUCOSE 101 (H) 08/11/2013 0406   BUN 47 (H) 03/03/2022 0555   BUN 18 08/11/2013 0406   CREATININE 1.92 (H) 03/03/2022 0555   CREATININE 1.43 (H) 08/11/2013 0406   CALCIUM 8.8 (L) 03/03/2022 0555   CALCIUM 8.7 08/11/2013 0406   GFRNONAA 35 (L) 03/03/2022 0555   GFRNONAA 49 (L) 08/11/2013 0406   GFRAA >60 04/25/2018 0255   GFRAA 56 (L) 08/11/2013 0406   CrCl cannot be calculated (Patient's most recent lab result is older than the maximum 21 days allowed.).  COAG Lab Results  Component Value Date   INR 1.1 02/28/2022   INR 1.06 04/16/2018   INR 1.1 08/11/2013    Radiology No results found.   Assessment/Plan Bilateral carotid artery stenosis Duplex today shows patent bilateral carotid artery stents with velocities that would be in the 1 to 39% range on each side.  Doing well.  Continue dual antiplatelet therapy and we will give him a new prescription for Plavix today.  Return in  6 months in follow-up.  Hypertension blood pressure control important in reducing the progression of atherosclerotic disease. On appropriate oral medications.   Hyperlipidemia lipid control important in reducing the progression of atherosclerotic disease. Continue statin therapy     Leotis Pain, MD  06/29/2022 11:25 AM    This note was created with Dragon medical transcription system.  Any errors from dictation are purely unintentional

## 2022-06-29 NOTE — Assessment & Plan Note (Signed)
Duplex today shows patent bilateral carotid artery stents with velocities that would be in the 1 to 39% range on each side.  Doing well.  Continue dual antiplatelet therapy and we will give him a new prescription for Plavix today.  Return in 6 months in follow-up.

## 2022-08-08 NOTE — Progress Notes (Unsigned)
08/09/22 8:59 AM   Xavier Davis 1941/05/03 [id number]  Referring provider:  Tracie Harrier, [facility] [place],  [place] [address]  Urological history  BPH with LUTS  - I PSS 9/4 - PVR 90 mL  - finasteride 5 mg and tamsulosin 0.4 mg daily   2. Erectile dysfunction  -contributing factors of age, anxiety, BPH, HTN, HLD, diabetes and history of smoking - SHIM 5 -tadalafil 20 mg, on-demand-dosing  3. Ejaculatory disorder   4. Elevated PSA -aged out of screening -PSA (06/2022) 0.63 from PCP -PSA 8.30 in 2014 w/ negative bx  Chief Complaint  Patient presents with   Benign Prostatic Hypertrophy   Erectile Dysfunction     HPI: Xavier Davis is a 81 y.o.male who returns today for 1 year follow-up with I PSS and SHIM.  He is having issues of feeling of incomplete bladder emptying, frequency, urge incontinence and nocturia x2.  Patient denies any modifying or aggravating factors.  Patient denies any gross hematuria, dysuria or suprapubic/flank pain.  Patient denies any fevers, chills, nausea or vomiting.    He is taking the tamsulosin 0.4 mg and the finasteride 5 mg as prescribed.  I PSS 9/4  PVR 90 mL    IPSS     Row Name 08/09/22 0800         International Prostate Symptom Score   How often have you had the sensation of not emptying your bladder? About half the time     How often have you had to urinate less than every two hours? Less than half the time     How often have you found you stopped and started again several times when you urinated? Less than 1 in 5 times     How often have you found it difficult to postpone urination? Not at All     How often have you had a weak urinary stream? Less than 1 in 5 times     How often have you had to strain to start urination? Not at All     How many times did you typically get up at night to urinate? 2 Times     Total IPSS Score 9       Quality of Life due to urinary symptoms   If  you were to spend the rest of your life with your urinary condition just the way it is now how would you feel about that? Mostly Disatisfied               Score:  1-7 Mild 8-19 Moderate 20-35 Severe  Patient is not having spontaneous erections.  He denies any pain or curvature with erections.  He is not responding to the PDE5i's.     SHIM     Row Name 08/09/22 0827         SHIM: Over the last 6 months:   How do you rate your confidence that you could get and keep an erection? Very Low     When you had erections with sexual stimulation, how often were your erections hard enough for penetration (entering your partner)? Almost Never or Never     During sexual intercourse, how often were you able to maintain your erection after you had penetrated (entered) your partner? Almost Never or Never     During sexual intercourse, how difficult was it to maintain your erection to completion of intercourse? Extremely Difficult     When you attempted sexual intercourse,  how often was it satisfactory for you? Almost Never or Never       SHIM Total Score   SHIM 5               PMH: Past Medical History:  Diagnosis Date   Anxiety    Atrial fibrillation (HCC)    BPH (benign prostatic hyperplasia)    Carotid artery disease (HCC)    s/p left carotid endarterectomy at [facility] in 2014   Carotid stenosis    bilateral   CKD (chronic kidney disease)    Elevated PSA    Fracture, thoracic vertebra ([place]) 05/06/2018   T3,T9 endplate fracture   Hemothorax on left 05/06/2018   HLD (hyperlipidemia)    HTN (hypertension)    Hyperlipidemia    Hypertension    Insomnia    Rib fractures 05/06/2018   Weak urinary stream     Surgical History: Past Surgical History:  Procedure Laterality Date   APPENDECTOMY     CAROTID PTA/STENT INTERVENTION Right 02/26/2022   Procedure: CAROTID PTA/STENT INTERVENTION;  Surgeon: Algernon Huxley, MD;  Location: [facility];  Service: Cardiovascular;   Laterality: Right;   carotid stenosis Left    ces and stent placement   cataract surgery Bilateral     Home Medications:  Allergies as of 08/09/2022       Reactions   Metoprolol    Other reaction(s): DROWSINESS Reviewed with patient and wife 03/01/22 - they do not remember patient having any reaction to metoprolol tartrate. In fact, the patient receive IV metoprolol during his hospitalization in 2019 without apparent side effects.    Metoprolol Tartrate    Other reaction(s): Other (See Comments)        Medication List        Accurate as of August 09, 2022  8:59 AM. If you have any questions, ask your nurse or doctor.          STOP taking these medications    nitroGLYCERIN 0.4 MG SL tablet Commonly known as: NITROSTAT       TAKE these medications    amLODipine 10 MG tablet Commonly known as: NORVASC Take 1 tablet by mouth daily.   apixaban 2.5 MG Tabs tablet Commonly known as: ELIQUIS Take 1 tablet (2.5 mg total) by mouth 2 (two) times daily.   aspirin EC 81 MG tablet Take 1 tablet (81 mg total) by mouth daily at 6 (six) AM. Swallow whole.   atorvastatin 40 MG tablet Commonly known as: LIPITOR Take 1 tablet (40 mg total) by mouth daily.   citalopram 40 MG tablet Commonly known as: CELEXA Take 40 mg by mouth daily.   clopidogrel 75 MG tablet Commonly known as: PLAVIX Take 1 tablet (75 mg total) by mouth daily at 6 (six) AM.   clopidogrel 75 MG tablet Commonly known as: PLAVIX Take 1 tablet (75 mg total) by mouth daily.   finasteride 5 MG tablet Commonly known as: PROSCAR Take 1 tablet (5 mg total) by mouth daily.   metoprolol tartrate 25 MG tablet Commonly known as: LOPRESSOR Take 0.5 tablets (12.5 mg total) by mouth 2 (two) times daily.   sildenafil 100 MG tablet Commonly known as: Viagra Take 1 tablet (100 mg total) by mouth daily as needed for erectile dysfunction. Do not use with nitrates. What changed: additional instructions    tamsulosin 0.4 MG Caps capsule Commonly known as: FLOMAX Take 1 capsule (0.4 mg total) by mouth daily.   traZODone 50 MG tablet  Commonly known as: DESYREL Take 50 mg by mouth at bedtime.        Allergies:  Allergies  Allergen Reactions   Metoprolol     Other reaction(s): DROWSINESS  Reviewed with patient and wife 03/01/22 - they do not remember patient having any reaction to metoprolol tartrate. In fact, the patient receive IV metoprolol during his hospitalization in 2019 without apparent side effects.    Metoprolol Tartrate     Other reaction(s): Other (See Comments)    Family History: Family History  Problem Relation Age of Onset   Stroke Father    CVA Father    Diabetes Mellitus II Father    Prostate cancer Neg Hx    Kidney disease Neg Hx    Kidney cancer Neg Hx    Bladder Cancer Neg Hx     Social History:  reports that he has quit smoking. He has never used smokeless tobacco. He reports current alcohol use of about 4.0 standard drinks of alcohol per week. He reports that he does not use drugs.   Physical Exam: BP (!) 189/72   Pulse 65   Ht $R'5\' 7"'NW$  (1.702 m)   Wt 143 lb (64.9 kg)   BMI 22.40 kg/m   Constitutional:  Well nourished. Alert and oriented, No acute distress. HEENT: [place] AT, moist mucus membranes.  Trachea midline Cardiovascular: No clubbing, cyanosis, or edema. Respiratory: Normal respiratory effort, no increased work of breathing. Neurologic: Grossly intact, no focal deficits, moving all 4 extremities. Psychiatric: Normal mood and affect.   Laboratory Data: Glucose 70 - 110 mg/dL 113 High    Sodium 136 - 145 mmol/L 141   Potassium 3.6 - 5.1 mmol/L 4.2   Chloride 97 - 109 mmol/L 106   Carbon Dioxide (CO2) 22.0 - 32.0 mmol/L 27.0   Urea Nitrogen (BUN) 7 - 25 mg/dL 33 High    Creatinine 0.7 - 1.3 mg/dL 1.4 High    Glomerular Filtration Rate (eGFR) >60 mL/min/1.73sq m 49 Low    Calcium 8.7 - 10.3 mg/dL 9.6   AST  8 - 39 U/L 20   ALT  6 - 57 U/L  20   Alk Phos (alkaline Phosphatase) 34 - 104 U/L 65   Albumin 3.5 - 4.8 g/dL 4.5   Bilirubin, Total 0.3 - 1.2 mg/dL 0.9   Protein, Total 6.1 - 7.9 g/dL 6.3   A/G Ratio 1.0 - 5.0 gm/dL 2.5   Resulting Agency  [facility] - LAB   Specimen Collected: 06/29/22 07:07   Performed by: [facility]: 06/29/22 11:10  Received From: [facility]  Result Received: 08/08/22 08:00   Hemoglobin A1C 4.2 - 5.6 % 6.7 High    Average Blood Glucose (Calc) mg/dL [facility] - LAB  Narrative Performed by [facility] - LAB Normal Range:    4.2 - 5.6%  Increased Risk:  5.7 - 6.4%  Diabetes:        >= 6.5%  Glycemic Control for adults with diabetes:  <7%    Specimen Collected: 06/29/22 07:07   Performed by: [facility]: 06/29/22 09:35  Received From: [facility]  Result Received: 08/08/22 08:00   PSA (Prostate Specific Antigen), Total 0.10 - 4.00 ng/mL 0.63   Resulting Agency  [facility] - [facility]  Narrative Performed by [facility] - LAB Test results were determined with [facility] Hybritech  Assay. Values obtained with different assay methods cannot be used interchangeably in serial testing. Assay results should not be interpreted as absolute evidence of the presence or absence of malignant disease  Specimen Collected: 06/29/22 07:07   Performed by: [place]: 06/29/22 11:06  Received From: [facility]  Result Received: 08/08/22 08:00   Color Colorless, Straw, Light Yellow, Yellow, Dark Yellow Light Yellow   Clarity Clear Clear   Specific Gravity 1.005 - 1.030 1.020   pH, Urine 5.0 - 8.0 5.0   Protein, Urinalysis Negative mg/dL 1+ Abnormal    Glucose, Urinalysis Negative mg/dL Negative   Ketones, Urinalysis Negative mg/dL Negative   Blood, Urinalysis Negative 1+ Abnormal    Nitrite,  Urinalysis Negative Negative   Leukocyte Esterase, Urinalysis Negative Negative   Bilirubin, Urinalysis Negative Negative   Urobilinogen, Urinalysis 0.2 - 1.0 mg/dL 0.2   WBC, UA <=5 /hpf 1   Red Blood Cells, Urinalysis <=3 /hpf 2   Bacteria, Urinalysis 0 - 5 /hpf 0-5   Squamous Epithelial Cells, Urinalysis /hpf <1   Resulting Agency  [facility] - LAB   Specimen Collected: 06/29/22 07:07   Performed by: [facility] - LAB Last Resulted: 06/29/22 09:40  Received From: [facility]  Result Received: 08/08/22 08:00   Cholesterol, Total 100 - 200 mg/dL 201 High    Triglyceride 35 - 199 mg/dL 120   HDL (High Density Lipoprotein) Cholesterol 29.0 - 71.0 mg/dL 40.4   LDL Calculated 0 - 130 mg/dL 137 High    VLDL Cholesterol mg/dL 24   Cholesterol/HDL Ratio  5.0   Resulting Agency  [facility] - LAB   Specimen Collected: 06/29/22 07:07   Performed by: [facility] - LAB Last Resulted: 06/29/22 11:11  Received From: [facility]  Result Received: 08/08/22 08:00   WBC (White Blood Cell Count) 4.1 - 10.2 10^3/uL 4.9   RBC (Red Blood Cell Count) 4.69 - 6.13 10^6/uL 4.56 Low    Hemoglobin 14.1 - 18.1 gm/dL 14.1   Hematocrit 40.0 - 52.0 % 41.5   MCV (Mean Corpuscular Volume) 80.0 - 100.0 fl 91.0   MCH (Mean Corpuscular Hemoglobin) 27.0 - 31.2 pg 30.9   MCHC (Mean Corpuscular Hemoglobin Concentration) 32.0 - 36.0 gm/dL 34.0   Platelet Count 150 - 450 10^3/uL 156   RDW-CV (Red Cell Distribution Width) 11.6 - 14.8 % 12.8   MPV (Mean Platelet Volume) 9.4 - 12.4 fl 9.8   Neutrophils 1.50 - 7.80 10^3/uL 3.11   Lymphocytes 1.00 - 3.60 10^3/uL 1.21   Monocytes 0.00 - 1.50 10^3/uL 0.45   Eosinophils 0.00 - 0.55 10^3/uL 0.08   Basophils 0.00 - 0.09 10^3/uL 0.02   Neutrophil % 32.0 - 70.0 % 63.8   Lymphocyte % 10.0 - 50.0 % 24.8   Monocyte % 4.0 - 13.0 % 9.2   Eosinophil % 1.0 - 5.0 % 1.6   Basophil% 0.0 - 2.0 % 0.4   Immature  Granulocyte % <=0.7 % 0.2   Immature Granulocyte Count <=0.06 10^3/L 0.01   Resulting Agency  [facility] - LAB   Specimen Collected: 06/29/22 07:07   Performed by: [place]: 06/29/22 08:06  Received From: [facility]  Result Received: 06/29/22 09:12  I have reviewed the labs.   Pertinent Imaging: N/A  Assessment & Plan:    1. BPH with LUTS -PSA stable  -UA benign  -  PVR < 300 cc  -most bothersome symptoms are frequency, urge incontinence and nocturia -continue conservative management, avoiding bladder irritants and timed voiding's -Continue tamsulosin 0.4 mg daily and finasteride 5 mg daily -Discussed that he is on maximum medical therapy and asked him to consider undergoing a bladder outlet procedure.  He is hesitant to proceed with this line of therapy as he feels whenever he has procedures done it triggers his atrial fibrillation.  He will continue the medication until he feels his symptoms are not tolerable.    2. ED -Not responding to PDE 5 inhibitors -We discussed ICI therapy for erectile dysfunction and he feels that that is too involved and is not interested -He also is against IPP due to the atrial fibrillation -continue sildenafil 100 mg, on-demand-dosing  3. Ejaculatory disorder  - He is not able to obtain full erections that last long enough for intercourse, therefore it is unknown if his ejaculatory disorder still persists. Will reassess during next visit.   4. Prostate cancer screening -AUA guidelines do not advise to screen for prostate cancer in men over the age of 5 or w/ < 10 years life expectancy  -Explained to him that at his age and with his health issues if we were to find a prostate cancer that the treatment may be even more so  detrimental to his health and that I would advise against screening for prostate cancer at this time  Return in about 1 year (around 08/10/2023) for IPSS, SHIM, PVR  and  exam.  Zara Council, PA-C  [facility] [address], [place], [place] [address] [phone]

## 2022-08-09 ENCOUNTER — Ambulatory Visit (INDEPENDENT_AMBULATORY_CARE_PROVIDER_SITE_OTHER): Payer: Medicare Other | Admitting: Urology

## 2022-08-09 ENCOUNTER — Encounter: Payer: Self-pay | Admitting: Urology

## 2022-08-09 VITALS — BP 189/72 | HR 65 | Ht 67.0 in | Wt 143.0 lb

## 2022-08-09 DIAGNOSIS — N138 Other obstructive and reflux uropathy: Secondary | ICD-10-CM | POA: Diagnosis not present

## 2022-08-09 DIAGNOSIS — N401 Enlarged prostate with lower urinary tract symptoms: Secondary | ICD-10-CM | POA: Diagnosis not present

## 2022-08-09 DIAGNOSIS — N5319 Other ejaculatory dysfunction: Secondary | ICD-10-CM | POA: Diagnosis not present

## 2022-08-09 DIAGNOSIS — Z125 Encounter for screening for malignant neoplasm of prostate: Secondary | ICD-10-CM | POA: Diagnosis not present

## 2022-08-09 DIAGNOSIS — I6523 Occlusion and stenosis of bilateral carotid arteries: Secondary | ICD-10-CM | POA: Diagnosis not present

## 2022-08-09 DIAGNOSIS — N529 Male erectile dysfunction, unspecified: Secondary | ICD-10-CM | POA: Diagnosis not present

## 2022-08-09 LAB — BLADDER SCAN AMB NON-IMAGING

## 2022-08-09 MED ORDER — FINASTERIDE 5 MG PO TABS: 5.0000 mg | ORAL_TABLET | Freq: Every day | ORAL | 3 refills | Status: DC

## 2022-08-09 MED ORDER — SILDENAFIL CITRATE 100 MG PO TABS: 100.0000 mg | ORAL_TABLET | Freq: Every day | ORAL | 0 refills | Status: AC | PRN

## 2022-08-09 MED ORDER — TAMSULOSIN HCL 0.4 MG PO CAPS: 0.4000 mg | ORAL_CAPSULE | Freq: Every day | ORAL | 3 refills | Status: DC

## 2022-09-03 ENCOUNTER — Encounter (INDEPENDENT_AMBULATORY_CARE_PROVIDER_SITE_OTHER): Payer: Self-pay

## 2022-12-31 ENCOUNTER — Other Ambulatory Visit (INDEPENDENT_AMBULATORY_CARE_PROVIDER_SITE_OTHER): Payer: Self-pay | Admitting: Vascular Surgery

## 2022-12-31 DIAGNOSIS — I6523 Occlusion and stenosis of bilateral carotid arteries: Secondary | ICD-10-CM

## 2023-01-04 ENCOUNTER — Ambulatory Visit (INDEPENDENT_AMBULATORY_CARE_PROVIDER_SITE_OTHER): Payer: Medicare Other

## 2023-01-04 ENCOUNTER — Ambulatory Visit (INDEPENDENT_AMBULATORY_CARE_PROVIDER_SITE_OTHER): Payer: Medicare Other | Admitting: Vascular Surgery

## 2023-01-04 VITALS — BP 173/74 | HR 64 | Resp 18 | Ht 67.0 in | Wt 143.0 lb

## 2023-01-04 DIAGNOSIS — I10 Essential (primary) hypertension: Secondary | ICD-10-CM | POA: Diagnosis not present

## 2023-01-04 DIAGNOSIS — E785 Hyperlipidemia, unspecified: Secondary | ICD-10-CM | POA: Diagnosis not present

## 2023-01-04 DIAGNOSIS — I6523 Occlusion and stenosis of bilateral carotid arteries: Secondary | ICD-10-CM

## 2023-01-04 NOTE — Assessment & Plan Note (Signed)
blood pressure control important in reducing the progression of atherosclerotic disease. On appropriate oral medications.  

## 2023-01-04 NOTE — Assessment & Plan Note (Signed)
He remains on Plavix and 2.5 mg of Eliquis.  He is also on Lipitor. Duplex today shows patent bilateral carotid artery stents with velocities that would be in the 1 to 39% range on each side.  Doing well.  Continue current medical regimen.  He is now 1 year out from his most recent carotid intervention, so we can go to an annual follow-up.

## 2023-01-04 NOTE — Assessment & Plan Note (Signed)
lipid control important in reducing the progression of atherosclerotic disease. Continue statin therapy  

## 2023-01-04 NOTE — Progress Notes (Signed)
MRN : [id number]  Xavier Davis is a 82 y.o. (09-Oct-1941) male who presents with chief complaint of  Chief Complaint  Patient presents with   Follow-up    Follow up 6 month carotid.  Marland Kitchen  History of Present Illness: Patient returns in follow-up of his carotid disease.  He is almost 10 years status post left carotid stent for high-grade recurrent stenosis.  He is about a year status post right carotid artery stent.  He is doing well.  He denies any focal neurologic symptoms. Specifically, the patient denies amaurosis fugax, speech or swallowing difficulties, or arm or leg weakness or numbness. He remains on Plavix and 2.5 mg of Eliquis.  He is also on Lipitor. Duplex today shows patent bilateral carotid artery stents with velocities that would be in the 1 to 39% range on each side.   Current Outpatient Medications  Medication Sig Dispense Refill   amLODipine (NORVASC) 10 MG tablet Take 1 tablet by mouth daily.     apixaban (ELIQUIS) 2.5 MG TABS tablet Take 1 tablet (2.5 mg total) by mouth 2 (two) times daily. 60 tablet 2   atorvastatin (LIPITOR) 40 MG tablet Take 1 tablet (40 mg total) by mouth daily. 30 tablet 1   citalopram (CELEXA) 40 MG tablet Take 40 mg by mouth daily.     clopidogrel (PLAVIX) 75 MG tablet Take 1 tablet (75 mg total) by mouth daily at 6 (six) AM. 30 tablet 11   finasteride (PROSCAR) 5 MG tablet Take 1 tablet (5 mg total) by mouth daily. 90 tablet 3   lisinopril (ZESTRIL) 20 MG tablet TAKE ONE TABLET BY MOUTH EVERY DAY FOR HIGH BLOOD PRESSURE *REPLACES LOSARTAN*     sildenafil (VIAGRA) 100 MG tablet Take 1 tablet (100 mg total) by mouth daily as needed for erectile dysfunction. Do not use with nitrates. 30 tablet 0   traZODone (DESYREL) 50 MG tablet Take 50 mg by mouth at bedtime.     aspirin EC 81 MG EC tablet Take 1 tablet (81 mg total) by mouth daily at 6 (six) AM. Swallow whole. (Patient not taking: Reported on 03/27/2022) 30 tablet 11   clopidogrel (PLAVIX) 75 MG  tablet Take 1 tablet (75 mg total) by mouth daily. (Patient not taking: Reported on 01/04/2023) 90 tablet 2   metoprolol tartrate (LOPRESSOR) 25 MG tablet Take 0.5 tablets (12.5 mg total) by mouth 2 (two) times daily. (Patient not taking: Reported on 06/29/2022) 60 tablet 1   tamsulosin (FLOMAX) 0.4 MG CAPS capsule Take 1 capsule (0.4 mg total) by mouth daily. (Patient not taking: Reported on 01/04/2023) 90 capsule 3   No current facility-administered medications for this visit.    Past Medical History:  Diagnosis Date   Anxiety    Atrial fibrillation (HCC)    BPH (benign prostatic hyperplasia)    Carotid artery disease ([place])    s/p left carotid endarterectomy at [facility] in 2014   Carotid stenosis    bilateral   CKD (chronic kidney disease)    Elevated PSA    Fracture, thoracic vertebra ([place]) 05/06/2018   T3,T9 endplate fracture   Hemothorax on left 05/06/2018   HLD (hyperlipidemia)    HTN (hypertension)    Hyperlipidemia    Hypertension    Insomnia    Rib fractures 05/06/2018   Weak urinary stream     Past Surgical History:  Procedure Laterality Date   APPENDECTOMY     CAROTID PTA/STENT INTERVENTION Right 02/26/2022   Procedure: CAROTID  PTA/STENT INTERVENTION;  Surgeon: Algernon Huxley, MD;  Location: [facility];  Service: Cardiovascular;  Laterality: Right;   carotid stenosis Left    ces and stent placement   cataract surgery Bilateral      Social History   Tobacco Use   Smoking status: Former   Smokeless tobacco: Never   Tobacco comments:    quit 40 years ago  Substance Use Topics   Alcohol use: Yes    Alcohol/week: 4.0 standard drinks of alcohol    Types: 4 Cans of beer per week    Comment: occasional 4 cans of beer/wkly   Drug use: Never      Family History  Problem Relation Age of Onset   Stroke Father    CVA Father    Diabetes Mellitus II Father    Prostate cancer Neg Hx    Kidney disease Neg Hx    Kidney cancer Neg Hx    Bladder Cancer Neg Hx       Allergies  Allergen Reactions   Metoprolol     Other reaction(s): DROWSINESS  Reviewed with patient and wife 03/01/22 - they do not remember patient having any reaction to metoprolol tartrate. In fact, the patient receive IV metoprolol during his hospitalization in 2019 without apparent side effects.    Metoprolol Tartrate     Other reaction(s): Other (See Comments)     REVIEW OF SYSTEMS (Negative unless checked)  Constitutional: '[]'$ Weight loss  '[]'$ Fever  '[]'$ Chills Cardiac: '[]'$ Chest pain   '[]'$ Chest pressure   '[x]'$ Palpitations   '[]'$ Shortness of breath when laying flat   '[]'$ Shortness of breath at rest   '[]'$ Shortness of breath with exertion. Vascular:  '[]'$ Pain in legs with walking   '[]'$ Pain in legs at rest   '[]'$ Pain in legs when laying flat   '[]'$ Claudication   '[]'$ Pain in feet when walking  '[]'$ Pain in feet at rest  '[]'$ Pain in feet when laying flat   '[]'$ History of DVT   '[]'$ Phlebitis   '[]'$ Swelling in legs   '[]'$ Varicose veins   '[]'$ Non-healing ulcers Pulmonary:   '[]'$ Uses home oxygen   '[]'$ Productive cough   '[]'$ Hemoptysis   '[]'$ Wheeze  '[]'$ COPD   '[]'$ Asthma Neurologic:  '[]'$ Dizziness  '[]'$ Blackouts   '[]'$ Seizures   '[]'$ History of stroke   '[]'$ History of TIA  '[]'$ Aphasia   '[]'$ Temporary blindness   '[]'$ Dysphagia   '[]'$ Weakness or numbness in arms   '[]'$ Weakness or numbness in legs Musculoskeletal:  '[x]'$ Arthritis   '[]'$ Joint swelling   '[x]'$ Joint pain   '[]'$ Low back pain Hematologic:  '[]'$ Easy bruising  '[]'$ Easy bleeding   '[]'$ Hypercoagulable state   '[]'$ Anemic  '[]'$ Hepatitis Gastrointestinal:  '[]'$ Blood in stool   '[]'$ Vomiting blood  '[]'$ Gastroesophageal reflux/heartburn   '[]'$ Difficulty swallowing. Genitourinary:  '[x]'$ Chronic kidney disease   '[]'$ Difficult urination  '[]'$ Frequent urination  '[]'$ Burning with urination   '[]'$ Blood in urine Skin:  '[]'$ Rashes   '[]'$ Ulcers   '[]'$ Wounds Psychological:  '[]'$ History of anxiety   '[]'$  History of major depression.  Physical Examination  Vitals:   01/04/23 0938  BP: (!) 173/74  Pulse: 64  Resp: 18  Weight: 143 lb (64.9 kg)  Height: '5\' 7"'$   (1.702 m)   Body mass index is 22.4 kg/m. Gen:  WD/WN, NAD Head: [place]/AT, No temporalis wasting. Ear/Nose/Throat: Hearing grossly intact, nares w/o erythema or drainage, trachea midline Eyes: Conjunctiva clear. Sclera non-icteric Neck: Supple.  No bruit  Pulmonary:  Good air movement, equal and clear to auscultation bilaterally.  Cardiac: Irregular Vascular:  Vessel Right Left  Radial Palpable Palpable  Musculoskeletal: M/S 5/5 throughout.  No deformity or atrophy. No edema. Neurologic: CN 2-12 intact. Sensation grossly intact in extremities.  Symmetrical.  Speech is fluent. Motor exam as listed above. Psychiatric: Judgment intact, Mood & affect appropriate for pt's clinical situation. Dermatologic: No rashes or ulcers noted.  No cellulitis or open wounds.    CBC Lab Results  Component Value Date   WBC 6.6 03/03/2022   HGB 10.7 (L) 03/03/2022   HCT 30.2 (L) 03/03/2022   MCV 93.2 03/03/2022   PLT 125 (L) 03/03/2022    BMET    Component Value Date/Time   NA 138 03/03/2022 0555   NA 139 08/11/2013 0406   K 3.7 03/03/2022 0555   K 4.2 08/11/2013 0406   CL 104 03/03/2022 0555   CL 107 08/11/2013 0406   CO2 26 03/03/2022 0555   CO2 29 08/11/2013 0406   GLUCOSE 128 (H) 03/03/2022 0555   GLUCOSE 101 (H) 08/11/2013 0406   BUN 47 (H) 03/03/2022 0555   BUN 18 08/11/2013 0406   CREATININE 1.92 (H) 03/03/2022 0555   CREATININE 1.43 (H) 08/11/2013 0406   CALCIUM 8.8 (L) 03/03/2022 0555   CALCIUM 8.7 08/11/2013 0406   GFRNONAA 35 (L) 03/03/2022 0555   GFRNONAA 49 (L) 08/11/2013 0406   GFRAA >60 04/25/2018 0255   GFRAA 56 (L) 08/11/2013 0406   CrCl cannot be calculated (Patient's most recent lab result is older than the maximum 21 days allowed.).  COAG Lab Results  Component Value Date   INR 1.1 02/28/2022   INR 1.06 04/16/2018   INR 1.1 08/11/2013    Radiology No results found.   Assessment/Plan Bilateral carotid artery stenosis He remains on Plavix  and 2.5 mg of Eliquis.  He is also on Lipitor. Duplex today shows patent bilateral carotid artery stents with velocities that would be in the 1 to 39% range on each side.  Doing well.  Continue current medical regimen.  He is now 1 year out from his most recent carotid intervention, so we can go to an annual follow-up.  Hypertension blood pressure control important in reducing the progression of atherosclerotic disease. On appropriate oral medications.   Hyperlipidemia lipid control important in reducing the progression of atherosclerotic disease. Continue statin therapy    Leotis Pain, MD  01/04/2023 10:10 AM    This note was created with Dragon medical transcription system.  Any errors from dictation are purely unintentional

## 2023-03-19 ENCOUNTER — Other Ambulatory Visit (INDEPENDENT_AMBULATORY_CARE_PROVIDER_SITE_OTHER): Payer: Self-pay | Admitting: Vascular Surgery

## 2023-08-12 NOTE — Progress Notes (Unsigned)
08/13/23 8:32 AM   Xavier Davis 05/28/1941 [id number]  Referring provider:  Barbette Reichmann, MD [address] [facility] [place],  [place] [address]  Urological history  BPH with LUTS  -- finasteride 5 mg and tamsulosin 0.4 mg daily   2. Erectile dysfunction  -contributing factors of age, anxiety, BPH, HTN, HLD, diabetes and history of smoking - not sexually active  3. Ejaculatory disorder   4. Elevated PSA -aged out of screening -PSA (06/2023) 0.73 from PCP -PSA 8.30 in 2014 w/ negative bx  Chief Complaint  Patient presents with   Benign Prostatic Hypertrophy    HPI: Xavier Davis is a 82 y.o.male who returns today for 1 year follow-up with I PSS and SHIM.  Previous records reviewed.   Up-to-date on wellness visits.  Has history of Paroxysmal a. Fib and NSTEMI.   His main complaint is a weak urinary stream.  Patient denies any modifying or aggravating factors.  Patient denies any recent UTI's, gross hematuria, dysuria or suprapubic/flank pain.  Patient denies any fevers, chills, nausea or vomiting.      IPSS     Row Name 08/13/23 0800         International Prostate Symptom Score   How often have you had the sensation of not emptying your bladder? Less than 1 in 5     How often have you had to urinate less than every two hours? Less than 1 in 5 times     How often have you found you stopped and started again several times when you urinated? Less than half the time     How often have you found it difficult to postpone urination? Less than half the time     How often have you had a weak urinary stream? Not at All     How often have you had to strain to start urination? Not at All     How many times did you typically get up at night to urinate? 2 Times     Total IPSS Score 8       Quality of Life due to urinary symptoms   If you were to spend the rest of your life with your urinary condition just the way it is now how would you feel about that?  Mixed              Score:  1-7 Mild 8-19 Moderate 20-35 Severe  PMH: Past Medical History:  Diagnosis Date   Anxiety    Atrial fibrillation (HCC)    BPH (benign prostatic hyperplasia)    Carotid artery disease (HCC)    s/p left carotid endarterectomy at [facility] in 2014   Carotid stenosis    bilateral   CKD (chronic kidney disease)    Elevated PSA    Fracture, thoracic vertebra (HCC) 05/06/2018   T3,T9 endplate fracture   Hemothorax on left 05/06/2018   HLD (hyperlipidemia)    HTN (hypertension)    Hyperlipidemia    Hypertension    Insomnia    Rib fractures 05/06/2018   Weak urinary stream     Surgical History: Past Surgical History:  Procedure Laterality Date   APPENDECTOMY     CAROTID PTA/STENT INTERVENTION Right 02/26/2022   Procedure: CAROTID PTA/STENT INTERVENTION;  Surgeon: Annice Needy, MD;  Location: [facility] INVASIVE CV LAB;  Service: Cardiovascular;  Laterality: Right;   carotid stenosis Left    ces and stent placement   cataract surgery Bilateral  Home Medications:  Allergies as of 08/13/2023       Reactions   Metoprolol    Other reaction(s): DROWSINESS Reviewed with patient and wife 03/01/22 - they do not remember patient having any reaction to metoprolol tartrate. In fact, the patient receive IV metoprolol during his hospitalization in 2019 without apparent side effects.    Metoprolol Tartrate    Other reaction(s): Other (See Comments)        Medication List        Accurate as of August 13, 2023  8:32 AM. If you have any questions, ask your nurse or doctor.          STOP taking these medications    aspirin EC 81 MG tablet Stopped by: Carollee Herter Dyllan Kats   metoprolol tartrate 25 MG tablet Commonly known as: LOPRESSOR Stopped by: Margerie Fraiser       TAKE these medications    amLODipine 10 MG tablet Commonly known as: NORVASC Take 1 tablet by mouth daily.   apixaban 2.5 MG Tabs tablet Commonly known as: ELIQUIS Take 1 tablet  (2.5 mg total) by mouth 2 (two) times daily.   atorvastatin 40 MG tablet Commonly known as: LIPITOR Take 1 tablet (40 mg total) by mouth daily.   citalopram 40 MG tablet Commonly known as: CELEXA Take 40 mg by mouth daily.   clopidogrel 75 MG tablet Commonly known as: PLAVIX Take 1 tablet (75 mg total) by mouth daily at 6 (six) AM.   clopidogrel 75 MG tablet Commonly known as: PLAVIX TAKE 1 TABLET BY MOUTH DAILY   finasteride 5 MG tablet Commonly known as: PROSCAR Take 1 tablet (5 mg total) by mouth daily.   lisinopril 20 MG tablet Commonly known as: ZESTRIL TAKE ONE TABLET BY MOUTH EVERY DAY FOR HIGH BLOOD PRESSURE *REPLACES LOSARTAN*   sildenafil 100 MG tablet Commonly known as: Viagra Take 1 tablet (100 mg total) by mouth daily as needed for erectile dysfunction. Do not use with nitrates.   tamsulosin 0.4 MG Caps capsule Commonly known as: FLOMAX Take 1 capsule (0.4 mg total) by mouth daily.   traZODone 50 MG tablet Commonly known as: DESYREL Take 50 mg by mouth at bedtime.        Allergies:  Allergies  Allergen Reactions   Metoprolol     Other reaction(s): DROWSINESS  Reviewed with patient and wife 03/01/22 - they do not remember patient having any reaction to metoprolol tartrate. In fact, the patient receive IV metoprolol during his hospitalization in 2019 without apparent side effects.    Metoprolol Tartrate     Other reaction(s): Other (See Comments)    Family History: Family History  Problem Relation Age of Onset   Stroke Father    CVA Father    Diabetes Mellitus II Father    Prostate cancer Neg Hx    Kidney disease Neg Hx    Kidney cancer Neg Hx    Bladder Cancer Neg Hx     Social History:  reports that he has quit smoking. He has never used smokeless tobacco. He reports current alcohol use of about 4.0 standard drinks of alcohol per week. He reports that he does not use drugs.   Physical Exam: BP (!) 147/72   Pulse 96   Ht 5\' 7"  (1.702  m)   Wt 148 lb (67.1 kg)   BMI 23.18 kg/m   Constitutional:  Well nourished. Alert and oriented, No acute distress. HEENT: [place] AT, moist mucus membranes.  Trachea midline Cardiovascular:  No clubbing, cyanosis, or edema. Respiratory: Normal respiratory effort, no increased work of breathing. Neurologic: Grossly intact, no focal deficits, moving all 4 extremities. Psychiatric: Normal mood and affect.   Laboratory Data: CBC w/auto Differential (5 Part) Order: [id number] Component Ref Range & Units 3 wk ago  WBC (White Blood Cell Count) 4.1 - 10.2 10^3/uL 5.2  RBC (Red Blood Cell Count) 4.69 - 6.13 10^6/uL 4.35 Low   Hemoglobin 14.1 - 18.1 gm/dL 57.8  Hematocrit 46.9 - 52.0 % 42.1  MCV (Mean Corpuscular Volume) 80.0 - 100.0 fl 96.8  MCH (Mean Corpuscular Hemoglobin) 27.0 - 31.2 pg 33.8 High   MCHC (Mean Corpuscular Hemoglobin Concentration) 32.0 - 36.0 gm/dL 62.9  Platelet Count 528 - 450 10^3/uL 139 Low   RDW-CV (Red Cell Distribution Width) 11.6 - 14.8 % 11.5 Low   MPV (Mean Platelet Volume) 9.4 - 12.4 fl 10.1  Neutrophils 1.50 - 7.80 10^3/uL 3.54  Lymphocytes 1.00 - 3.60 10^3/uL 1.11  Monocytes 0.00 - 1.50 10^3/uL 0.39  Eosinophils 0.00 - 0.55 10^3/uL 0.09  Basophils 0.00 - 0.09 10^3/uL 0.02  Neutrophil % 32.0 - 70.0 % 68.6  Lymphocyte % 10.0 - 50.0 % 21.5  Monocyte % 4.0 - 13.0 % 7.6  Eosinophil % 1.0 - 5.0 % 1.7  Basophil% 0.0 - 2.0 % 0.4  Immature Granulocyte % <=0.7 % 0.2  Immature Granulocyte Count <=0.06 10^3/L 0.01  Resulting Agency [facility]   Specimen Collected: 07/22/23 07:32   Performed by: [facility] Last Resulted: 07/22/23 11:45  Received From: [facility]  Result Received: 08/12/23 08:19   Comprehensive Metabolic Panel (CMP) Order: [id number] Component Ref Range & Units 3 wk ago  Glucose 70 - 110 mg/dL 272 High   Sodium 536 - 145 mmol/L 141  Potassium 3.6 - 5.1 mmol/L 4.6   Chloride 97 - 109 mmol/L 104  Carbon Dioxide (CO2) 22.0 - 32.0 mmol/L 28.7  Urea Nitrogen (BUN) 7 - 25 mg/dL 29 High   Creatinine 0.7 - 1.3 mg/dL 1.6 High   Glomerular Filtration Rate (eGFR) >60 mL/min/1.73sq m 43 Low   Comment: CKD-EPI (2021) does not include patient's race in the calculation of eGFR.  Monitoring changes of plasma creatinine and eGFR over time is useful for monitoring kidney function.  Interpretive Ranges for eGFR (CKD-EPI 2021):  eGFR:       >60 mL/min/1.73 sq. m - Normal eGFR:       30-59 mL/min/1.73 sq. m - Moderately Decreased eGFR:       15-29 mL/min/1.73 sq. m  - Severely Decreased eGFR:       < 15 mL/min/1.73 sq. m  - Kidney Failure   Note: These eGFR calculations do not apply in acute situations when eGFR is changing rapidly or patients on dialysis.  Calcium 8.7 - 10.3 mg/dL 9.6  AST 8 - 39 U/L 19  ALT 6 - 57 U/L 25  Alk Phos (alkaline Phosphatase) 34 - 104 U/L 59  Albumin 3.5 - 4.8 g/dL 4.6  Bilirubin, Total 0.3 - 1.2 mg/dL 1.3 High   Protein, Total 6.1 - 7.9 g/dL 6.6  A/G Ratio 1.0 - 5.0 gm/dL 2.3  Resulting Agency [facility]   Specimen Collected: 07/22/23 07:32   Performed by: [facility] Last Resulted: 07/22/23 13:16  Received From: [facility]  Result Received: 08/12/23 08:19   Lipid Panel w/calc LDL Order: [id number] Component Ref Range & Units 3  wk ago  Cholesterol, Total 100 - 200 mg/dL 454  Triglyceride 35 - 199 mg/dL 098  HDL (High Density Lipoprotein) Cholesterol 29.0 - 71.0 mg/dL 11.9  LDL Calculated 0 - 130 mg/dL 56  VLDL Cholesterol mg/dL 23  Cholesterol/HDL Ratio 3.0  Resulting Agency [facility]   Specimen Collected: 07/22/23 07:32   Performed by: [facility] Last Resulted: 07/22/23 13:21  Received From: [facility]  Result Received: 08/12/23 08:19   Thyroid Stimulating-Hormone (TSH) Order:  [id number] Component Ref Range & Units 3 wk ago  Thyroid Stimulating Hormone (TSH) 0.450-5.330 uIU/ml uIU/mL 1.168  Resulting Agency [facility]   Specimen Collected: 07/22/23 07:32   Performed by: [facility] Last Resulted: 07/22/23 12:12  Received From: [facility]  Result Received: 08/12/23 08:19    PSA, Total (Screen) Order: [id number] Component Ref Range & Units 3 wk ago  PSA (Prostate Specific Antigen), Total 0.10 - 4.00 ng/mL 0.73  Resulting Agency [facility]  Narrative Performed by [facility] - [facility] Test results were determined with Beckman Coulter Hybritech Assay. Values obtained with different assay methods cannot be used interchangeably in serial testing. Assay results should not be interpreted as absolute evidence of the presence or absence of malignant disease  Specimen Collected: 07/22/23 07:32   Performed by: [facility] Last Resulted: 07/22/23 12:13  Received From: [facility]  Result Received: 08/12/23 08:19    Hemoglobin A1C Order: [id number] Component Ref Range & Units 3 wk ago  Hemoglobin A1C 4.2 - 5.6 % 7.0 High   Average Blood Glucose (Calc) mg/dL 132  Resulting Agency [facility]  Narrative Performed by [facility] Normal Range:    4.2 - 5.6% Increased Risk:  5.7 - 6.4% Diabetes:        >= 6.5% Glycemic Control for adults with diabetes:  <7%    Specimen Collected: 07/22/23 07:32   Performed by: [facility] Last Resulted: 07/22/23 09:09  Received From: [facility]  Result Received: 08/12/23 08:19    Urinalysis: tains abnormal data Urinalysis w/Microscopic Order: [id number] Component Ref Range & Units 3 wk ago  Color Colorless, Straw, Light Yellow, Yellow, Dark Yellow Light Yellow  Clarity Clear Clear  Specific Gravity 1.005 - 1.030 1.017  pH, Urine 5.0 - 8.0 5.5  Protein,  Urinalysis Negative mg/dL Trace Abnormal   Glucose, Urinalysis Negative mg/dL Negative  Ketones, Urinalysis Negative mg/dL Negative  Blood, Urinalysis Negative 1+ Abnormal   Nitrite, Urinalysis Negative Negative  Leukocyte Esterase, Urinalysis Negative Negative  Bilirubin, Urinalysis Negative Negative  Urobilinogen, Urinalysis 0.2 - 1.0 mg/dL 0.2  WBC, UA <=5 /hpf <1  Red Blood Cells, Urinalysis <=3 /hpf 1  Bacteria, Urinalysis 0 - 5 /hpf 0-5  Squamous Epithelial Cells, Urinalysis /hpf 0  Resulting Agency [facility]   Specimen Collected: 07/22/23 07:32   Performed by: [facility] Last Resulted: 07/22/23 09:14  Received From: [facility]  Result Received: 08/12/23 08:19   Received Information I have reviewed the labs.   Pertinent Imaging: N/A  Assessment & Plan:    1. BPH with LU TS - moderate symptoms, most bothersome symptom is weak urinary stream - PSA stable  - UA benign  -continue conservative management, avoiding bladder irritants and timed voiding's -continue tamsulosin 0.4 mg daily  and finasteride 5 mg daily -he has not had any lightheadedness while taking the tamsulosin, since he is reluctant to undergo any bladder outlet procedures for his symptoms, I have suggested taking 2 tamsulosin 0.4 mg daily to see if it will strengthen his urinary stream  2. ED -Not responding to PDE 5 inhibitors -no longer sexually active   3. Ejaculatory disorder  -not sexually active  4. Prostate cancer screening -PCP continues to screen  Return in about 1 year (around 08/12/2024) for I PSS .  Cloretta Ned   [facility] [address], [address] [place], [place] [address] [phone]

## 2023-08-13 ENCOUNTER — Ambulatory Visit (INDEPENDENT_AMBULATORY_CARE_PROVIDER_SITE_OTHER): Payer: Medicare Other | Admitting: Urology

## 2023-08-13 ENCOUNTER — Encounter: Payer: Self-pay | Admitting: Urology

## 2023-08-13 VITALS — BP 147/72 | HR 96 | Ht 67.0 in | Wt 148.0 lb

## 2023-08-13 DIAGNOSIS — N529 Male erectile dysfunction, unspecified: Secondary | ICD-10-CM | POA: Diagnosis not present

## 2023-08-13 DIAGNOSIS — N401 Enlarged prostate with lower urinary tract symptoms: Secondary | ICD-10-CM

## 2023-08-13 DIAGNOSIS — N5319 Other ejaculatory dysfunction: Secondary | ICD-10-CM

## 2023-08-13 MED ORDER — FINASTERIDE 5 MG PO TABS
5.0000 mg | ORAL_TABLET | Freq: Every day | ORAL | 3 refills | Status: DC
Start: 2023-08-13 — End: 2024-08-12

## 2023-08-13 MED ORDER — TAMSULOSIN HCL 0.4 MG PO CAPS: 0.4000 mg | ORAL_CAPSULE | Freq: Every day | ORAL | 3 refills | Status: AC

## 2023-12-18 ENCOUNTER — Other Ambulatory Visit (INDEPENDENT_AMBULATORY_CARE_PROVIDER_SITE_OTHER): Payer: Self-pay | Admitting: Nurse Practitioner

## 2024-01-03 ENCOUNTER — Ambulatory Visit (INDEPENDENT_AMBULATORY_CARE_PROVIDER_SITE_OTHER): Payer: Medicare Other

## 2024-01-03 ENCOUNTER — Ambulatory Visit (INDEPENDENT_AMBULATORY_CARE_PROVIDER_SITE_OTHER): Payer: Medicare Other | Admitting: Vascular Surgery

## 2024-01-03 ENCOUNTER — Encounter (INDEPENDENT_AMBULATORY_CARE_PROVIDER_SITE_OTHER): Payer: Self-pay | Admitting: Vascular Surgery

## 2024-01-03 VITALS — BP 148/70 | HR 95 | Resp 16 | Wt 152.8 lb

## 2024-01-03 DIAGNOSIS — I10 Essential (primary) hypertension: Secondary | ICD-10-CM | POA: Diagnosis not present

## 2024-01-03 DIAGNOSIS — I6523 Occlusion and stenosis of bilateral carotid arteries: Secondary | ICD-10-CM

## 2024-01-03 DIAGNOSIS — E785 Hyperlipidemia, unspecified: Secondary | ICD-10-CM

## 2024-01-03 NOTE — Progress Notes (Signed)
 MRN : [id number]  Xavier Davis is a 83 y.o. (10/07/1941) male who presents with chief complaint of  Chief Complaint  Patient presents with   Follow-up    6 month carotid follow up  .  History of Present Illness: Patient returns in follow-up of his carotid disease.  He underwent a left carotid stent placement about a decade ago and a right carotid stent placement about 2 years ago.  He is doing well today.  He denies any focal neurologic symptoms. Specifically, the patient denies amaurosis fugax, speech or swallowing difficulties, or arm or leg weakness or numbness.  Duplex today showed both stents to have good flow with velocities in the 1 to 39% range bilaterally without significant recurrent stenosis.  Current Outpatient Medications  Medication Sig Dispense Refill   amLODipine (NORVASC) 10 MG tablet Take 1 tablet by mouth daily.     apixaban (ELIQUIS) 2.5 MG TABS tablet Take 1 tablet (2.5 mg total) by mouth 2 (two) times daily. 60 tablet 2   atorvastatin (LIPITOR) 40 MG tablet Take 1 tablet (40 mg total) by mouth daily. 30 tablet 1   citalopram (CELEXA) 40 MG tablet Take 40 mg by mouth daily.     clopidogrel (PLAVIX) 75 MG tablet Take 1 tablet (75 mg total) by mouth daily at 6 (six) AM. 30 tablet 11   clopidogrel (PLAVIX) 75 MG tablet TAKE 1 TABLET BY MOUTH DAILY 90 tablet 2   clopidogrel (PLAVIX) 75 MG tablet TAKE 1 TABLET BY MOUTH DAILY 90 tablet 3   finasteride (PROSCAR) 5 MG tablet Take 1 tablet (5 mg total) by mouth daily. 90 tablet 3   lisinopril (ZESTRIL) 20 MG tablet TAKE ONE TABLET BY MOUTH EVERY DAY FOR HIGH BLOOD PRESSURE *REPLACES LOSARTAN*     sildenafil (VIAGRA) 100 MG tablet Take 1 tablet (100 mg total) by mouth daily as needed for erectile dysfunction. Do not use with nitrates. 30 tablet 0   tamsulosin (FLOMAX) 0.4 MG CAPS capsule Take 1 capsule (0.4 mg total) by mouth daily. 90 capsule 3   traZODone (DESYREL) 50 MG tablet Take 50 mg by mouth at bedtime.     No  current facility-administered medications for this visit.    Past Medical History:  Diagnosis Date   Anxiety    Atrial fibrillation (HCC)    BPH (benign prostatic hyperplasia)    Carotid artery disease (HCC)    s/p left carotid endarterectomy at [facility] in 2014   Carotid stenosis    bilateral   CKD (chronic kidney disease)    Elevated PSA    Fracture, thoracic vertebra (HCC) 05/06/2018   T3,T9 endplate fracture   Hemothorax on left 05/06/2018   HLD (hyperlipidemia)    HTN (hypertension)    Hyperlipidemia    Hypertension    Insomnia    Rib fractures 05/06/2018   Weak urinary stream     Past Surgical History:  Procedure Laterality Date   APPENDECTOMY     CAROTID PTA/STENT INTERVENTION Right 02/26/2022   Procedure: CAROTID PTA/STENT INTERVENTION;  Surgeon: Annice Needy, MD;  Location: [facility] INVASIVE CV LAB;  Service: Cardiovascular;  Laterality: Right;   carotid stenosis Left    ces and stent placement   cataract surgery Bilateral      Social History   Tobacco Use   Smoking status: Former   Smokeless tobacco: Never   Tobacco comments:    quit 40 years ago  Substance Use Topics   Alcohol use: Yes  Alcohol/week: 4.0 standard drinks of alcohol    Types: 4 Cans of beer per week    Comment: occasional 4 cans of beer/wkly   Drug use: Never      Family History  Problem Relation Age of Onset   Stroke Father    CVA Father    Diabetes Mellitus II Father    Prostate cancer Neg Hx    Kidney disease Neg Hx    Kidney cancer Neg Hx    Bladder Cancer Neg Hx      Allergies  Allergen Reactions   Metoprolol     Other reaction(s): DROWSINESS  Reviewed with patient and wife 03/01/22 - they do not remember patient having any reaction to metoprolol tartrate. In fact, the patient receive IV metoprolol during his hospitalization in 2019 without apparent side effects.    Metoprolol Tartrate     Other reaction(s): Other (See Comments)     REVIEW OF SYSTEMS (Negative unless  checked)   Constitutional: [] Weight loss  [] Fever  [] Chills Cardiac: [] Chest pain   [] Chest pressure   [x] Palpitations   [] Shortness of breath when laying flat   [] Shortness of breath at rest   [] Shortness of breath with exertion. Vascular:  [] Pain in legs with walking   [] Pain in legs at rest   [] Pain in legs when laying flat   [] Claudication   [] Pain in feet when walking  [] Pain in feet at rest  [] Pain in feet when laying flat   [] History of DVT   [] Phlebitis   [] Swelling in legs   [] Varicose veins   [] Non-healing ulcers Pulmonary:   [] Uses home oxygen   [] Productive cough   [] Hemoptysis   [] Wheeze  [] COPD   [] Asthma Neurologic:  [] Dizziness  [] Blackouts   [] Seizures   [] History of stroke   [] History of TIA  [] Aphasia   [] Temporary blindness   [] Dysphagia   [] Weakness or numbness in arms   [] Weakness or numbness in legs Musculoskeletal:  [x] Arthritis   [] Joint swelling   [x] Joint pain   [] Low back pain Hematologic:  [] Easy bruising  [] Easy bleeding   [] Hypercoagulable state   [] Anemic  [] Hepatitis Gastrointestinal:  [] Blood in stool   [] Vomiting blood  [] Gastroesophageal reflux/heartburn   [] Difficulty swallowing. Genitourinary:  [x] Chronic kidney disease   [] Difficult urination  [] Frequent urination  [] Burning with urination   [] Blood in urine Skin:  [] Rashes   [] Ulcers   [] Wounds Psychological:  [] History of anxiety   []  History of major depression.  Physical Examination  Vitals:   01/03/24 0912  BP: (!) 148/70  Pulse: 95  Resp: 16  Weight: 152 lb 12.8 oz (69.3 kg)   Body mass index is 23.93 kg/m. Gen:  WD/WN, NAD Head: [place]/AT, No temporalis wasting. Ear/Nose/Throat: Hearing grossly intact, nares w/o erythema or drainage, trachea midline Eyes: Conjunctiva clear. Sclera non-icteric Neck: Supple.  No bruit  Pulmonary:  Good air movement, equal and clear to auscultation bilaterally.  Cardiac: RRR, No JVD Vascular:  Vessel Right Left  Radial Palpable Palpable            Musculoskeletal: M/S 5/5 throughout.  No deformity or atrophy. No edema. Neurologic: CN 2-12 intact. Sensation grossly intact in extremities.  Symmetrical.  Speech is fluent. Motor exam as listed above. Psychiatric: Judgment intact, Mood & affect appropriate for pt's clinical situation. Dermatologic: No rashes or ulcers noted.  No cellulitis or open wounds.     CBC Lab Results  Component Value Date   WBC 6.6 03/03/2022   HGB  10.7 (L) 03/03/2022   HCT 30.2 (L) 03/03/2022   MCV 93.2 03/03/2022   PLT 125 (L) 03/03/2022    BMET    Component Value Date/Time   NA 138 03/03/2022 0555   NA 139 08/11/2013 0406   K 3.7 03/03/2022 0555   K 4.2 08/11/2013 0406   CL 104 03/03/2022 0555   CL 107 08/11/2013 0406   CO2 26 03/03/2022 0555   CO2 29 08/11/2013 0406   GLUCOSE 128 (H) 03/03/2022 0555   GLUCOSE 101 (H) 08/11/2013 0406   BUN 47 (H) 03/03/2022 0555   BUN 18 08/11/2013 0406   CREATININE 1.92 (H) 03/03/2022 0555   CREATININE 1.43 (H) 08/11/2013 0406   CALCIUM 8.8 (L) 03/03/2022 0555   CALCIUM 8.7 08/11/2013 0406   GFRNONAA 35 (L) 03/03/2022 0555   GFRNONAA 49 (L) 08/11/2013 0406   GFRAA >60 04/25/2018 0255   GFRAA 56 (L) 08/11/2013 0406   CrCl cannot be calculated (Patient's most recent lab result is older than the maximum 21 days allowed.).  COAG Lab Results  Component Value Date   INR 1.1 02/28/2022   INR 1.06 04/16/2018   INR 1.1 08/11/2013    Radiology No results found.   Assessment/Plan Carotid artery stenosis Duplex today showed both stents to have good flow with velocities in the 1 to 39% range bilaterally without significant recurrent stenosis.  We can now follow this on an annual basis with duplex.  Contact our office or seek immediate medical attention with any focal neurologic symptoms in the interim.  Hypertension blood pressure control important in reducing the progression of atherosclerotic disease. On appropriate oral medications.      Hyperlipidemia lipid control important in reducing the progression of atherosclerotic disease. Continue statin therapy  Festus Barren, MD  01/05/2024 11:28 AM    This note was created with Dragon medical transcription system.  Any errors from dictation are purely unintentional

## 2024-01-05 NOTE — Assessment & Plan Note (Signed)
 Duplex today showed both stents to have good flow with velocities in the 1 to 39% range bilaterally without significant recurrent stenosis.  We can now follow this on an annual basis with duplex.  Contact our office or seek immediate medical attention with any focal neurologic symptoms in the interim.

## 2024-03-24 ENCOUNTER — Encounter (INDEPENDENT_AMBULATORY_CARE_PROVIDER_SITE_OTHER): Payer: Self-pay

## 2024-08-10 NOTE — Progress Notes (Unsigned)
 08/12/24 9:34 AM   Xavier Davis Public 07-05-1941 [id number]  Referring provider:  Sadie Manna, MD [address] [facility] [place],  [place] [address]  Urological history  BPH with LUTS  - finasteride  5 mg and tamsulosin  0.4 mg daily   2. Erectile dysfunction  -contributing factors of age, anxiety, BPH, HTN, HLD, diabetes and history of smoking - not sexually active  3. Ejaculatory disorder   4. Elevated PSA -aged out of screening -PSA (06/2023) 0.73 from PCP -PSA 8.30 in 2014 w/ negative bx  Chief Complaint  Patient presents with   Medical Management of Chronic Issues    HPI: Xavier Davis is a 83 y.o.male who returns today for 1 year follow-up with I PSS and SHIM.  Previous records reviewed.     I PSS 7/2  He has a occasional intermittency, but nothing bothersome.  Patient denies any modifying or aggravating factors.  Patient denies any recent UTI's, gross hematuria, dysuria or suprapubic/flank pain.  Patient denies any fevers, chills, nausea or vomiting.    UA 4+ glucose   Serum creatinine 1.8, eGFR 37  Hemoglobin A1c 7.0  He has been taking sildenafil  100 mg on demand dosing.  He is still not achieving satisfactory erections with the medication.  He states he has tried Cialis  in the past and he did not find it effective.   PMH: Past Medical History:  Diagnosis Date   Anxiety    Atrial fibrillation (HCC)    BPH (benign prostatic hyperplasia)    Carotid artery disease    s/p left carotid endarterectomy at [facility] in 2014   Carotid stenosis    bilateral   CKD (chronic kidney disease)    Elevated PSA    Fracture, thoracic vertebra (HCC) 05/06/2018   T3,T9 endplate fracture   Hemothorax on left 05/06/2018   HLD (hyperlipidemia)    HTN (hypertension)    Hyperlipidemia    Hypertension    Insomnia    Rib fractures 05/06/2018   Weak urinary stream     Surgical History: Past Surgical History:  Procedure Laterality Date    APPENDECTOMY     CAROTID PTA/STENT INTERVENTION Right 02/26/2022   Procedure: CAROTID PTA/STENT INTERVENTION;  Surgeon: Marea Selinda RAMAN, MD;  Location: [facility] INVASIVE CV LAB;  Service: Cardiovascular;  Laterality: Right;   carotid stenosis Left    ces and stent placement   cataract surgery Bilateral     Home Medications:  Allergies as of 08/12/2024       Reactions   Metoprolol     Other reaction(s): DROWSINESS Reviewed with patient and wife 03/01/22 - they do not remember patient having any reaction to metoprolol  tartrate. In fact, the patient receive IV metoprolol  during his hospitalization in 2019 without apparent side effects.    Metoprolol  Tartrate    Other reaction(s): Other (See Comments)        Medication List        Accurate as of August 12, 2024  9:34 AM. If you have any questions, ask your nurse or doctor.          amLODipine  10 MG tablet Commonly known as: NORVASC  Take 1 tablet by mouth daily.   apixaban  2.5 MG Tabs tablet Commonly known as: ELIQUIS  Take 1 tablet (2.5 mg total) by mouth 2 (two) times daily.   atorvastatin  40 MG tablet Commonly known as: LIPITOR Take 1 tablet (40 mg total) by mouth daily.   citalopram  40 MG tablet Commonly known as: CELEXA  Take  40 mg by mouth daily.   clopidogrel  75 MG tablet Commonly known as: PLAVIX  Take 1 tablet (75 mg total) by mouth daily at 6 (six) AM.   clopidogrel  75 MG tablet Commonly known as: PLAVIX  TAKE 1 TABLET BY MOUTH DAILY   clopidogrel  75 MG tablet Commonly known as: PLAVIX  TAKE 1 TABLET BY MOUTH DAILY   finasteride  5 MG tablet Commonly known as: PROSCAR  Take 1 tablet (5 mg total) by mouth daily.   lisinopril  20 MG tablet Commonly known as: ZESTRIL  TAKE ONE TABLET BY MOUTH EVERY DAY FOR HIGH BLOOD PRESSURE *REPLACES LOSARTAN*   sildenafil  100 MG tablet Commonly known as: Viagra  Take 1 tablet (100 mg total) by mouth daily as needed for erectile dysfunction. Do not use with nitrates. What changed:  Another medication with the same name was added. Make sure you understand how and when to take each.   sildenafil  100 MG tablet Commonly known as: VIAGRA  Take 1 tablet (100 mg total) by mouth daily as needed for erectile dysfunction. What changed: You were already taking a medication with the same name, and this prescription was added. Make sure you understand how and when to take each.   tamsulosin  0.4 MG Caps capsule Commonly known as: FLOMAX  Take 1 capsule (0.4 mg total) by mouth daily.   traZODone  50 MG tablet Commonly known as: DESYREL  Take 50 mg by mouth at bedtime.        Allergies:  Allergies  Allergen Reactions   Metoprolol      Other reaction(s): DROWSINESS  Reviewed with patient and wife 03/01/22 - they do not remember patient having any reaction to metoprolol  tartrate. In fact, the patient receive IV metoprolol  during his hospitalization in 2019 without apparent side effects.    Metoprolol  Tartrate     Other reaction(s): Other (See Comments)    Family History: Family History  Problem Relation Age of Onset   Stroke Father    CVA Father    Diabetes Mellitus II Father    Prostate cancer Neg Hx    Kidney disease Neg Hx    Kidney cancer Neg Hx    Bladder Cancer Neg Hx     Social History:  reports that he has quit smoking. He has never used smokeless tobacco. He reports current alcohol use of about 4.0 standard drinks of alcohol per week. He reports that he does not use drugs.   Physical Exam: BP 119/64   Pulse 70   Ht 5' 7 (1.702 m)   Wt 149 lb (67.6 kg)   BMI 23.34 kg/m   Constitutional:  Well nourished. Alert and oriented, No acute distress. HEENT: [place] AT, moist mucus membranes.  Trachea midline Cardiovascular: No clubbing, cyanosis, or edema. Respiratory: Normal respiratory effort, no increased work of breathing. Neurologic: Grossly intact, no focal deficits, moving all 4 extremities. Psychiatric: Normal mood and affect.   Laboratory Data: See Epic  and HPI  I have reviewed the labs.   Pertinent Imaging: N/A  Assessment & Plan:    1. BPH with LU TS - Continue tamsulosin  0.4 mg daily, he gets this from the [place] - Continue finasteride  5 mg daily, [facility] and I have sent in refills  2. ED - We discussed a trial of ICI, but he deferred and he prefers to have another prescription for Viagra  100 mg on demand dosing - Prescription sent to [facility] so he can use GoodRx card   Return in about 1 year (around 08/12/2025) for  I PSS/SHIM .  Xavier Davis   [facility] [address] [place], [place] [address] [phone]

## 2024-08-12 ENCOUNTER — Encounter: Payer: Self-pay | Admitting: Urology

## 2024-08-12 ENCOUNTER — Ambulatory Visit (INDEPENDENT_AMBULATORY_CARE_PROVIDER_SITE_OTHER): Payer: Self-pay | Admitting: Urology

## 2024-08-12 VITALS — BP 119/64 | HR 70 | Ht 67.0 in | Wt 149.0 lb

## 2024-08-12 DIAGNOSIS — N529 Male erectile dysfunction, unspecified: Secondary | ICD-10-CM | POA: Diagnosis not present

## 2024-08-12 DIAGNOSIS — N138 Other obstructive and reflux uropathy: Secondary | ICD-10-CM | POA: Diagnosis not present

## 2024-08-12 DIAGNOSIS — N401 Enlarged prostate with lower urinary tract symptoms: Secondary | ICD-10-CM | POA: Diagnosis not present

## 2024-08-12 MED ORDER — FINASTERIDE 5 MG PO TABS: 5.0000 mg | ORAL_TABLET | Freq: Every day | ORAL | 3 refills | Status: AC

## 2024-08-12 MED ORDER — SILDENAFIL CITRATE 100 MG PO TABS: 100.0000 mg | ORAL_TABLET | Freq: Every day | ORAL | 0 refills | Status: AC | PRN

## 2024-09-01 ENCOUNTER — Ambulatory Visit: Attending: Orthopedic Surgery

## 2024-09-01 DIAGNOSIS — G8929 Other chronic pain: Secondary | ICD-10-CM | POA: Diagnosis present

## 2024-09-01 DIAGNOSIS — M6281 Muscle weakness (generalized): Secondary | ICD-10-CM | POA: Diagnosis present

## 2024-09-01 DIAGNOSIS — M25511 Pain in right shoulder: Secondary | ICD-10-CM | POA: Diagnosis present

## 2024-09-01 NOTE — Therapy (Signed)
 OUTPATIENT PHYSICAL THERAPY SHOULDER/ELBOW EVALUATION  Patient Name: Xavier Davis MRN: [id number] DOB:02-28-1941, 83 y.o., male Today's Date: 09/01/2024  END OF SESSION:  PT End of Session - 09/01/24 0949     Visit Number 1    Number of Visits 17    PT Start Time 0955    PT Stop Time 1030    PT Time Calculation (min) 35 min    Activity Tolerance Patient tolerated treatment well    Behavior During Therapy WFL for tasks assessed/performed          Past Medical History:  Diagnosis Date   Anxiety    Atrial fibrillation (HCC)    BPH (benign prostatic hyperplasia)    Carotid artery disease    s/p left carotid endarterectomy at [facility] in 2014   Carotid stenosis    bilateral   CKD (chronic kidney disease)    Elevated PSA    Fracture, thoracic vertebra (HCC) 05/06/2018   T3,T9 endplate fracture   Hemothorax on left 05/06/2018   HLD (hyperlipidemia)    HTN (hypertension)    Hyperlipidemia    Hypertension    Insomnia    Rib fractures 05/06/2018   Weak urinary stream    Past Surgical History:  Procedure Laterality Date   APPENDECTOMY     CAROTID PTA/STENT INTERVENTION Right 02/26/2022   Procedure: CAROTID PTA/STENT INTERVENTION;  Surgeon: Marea Selinda RAMAN, MD;  Location: [facility] INVASIVE CV LAB;  Service: Cardiovascular;  Laterality: Right;   carotid stenosis Left    ces and stent placement   cataract surgery Bilateral    Patient Active Problem List   Diagnosis Date Noted   Congestive heart failure (HCC) 06/29/2022   Encounter for therapeutic drug level monitoring 06/29/2022   Erectile dysfunction 06/29/2022   History of anemia 06/29/2022   Hypertensive heart and chronic kidney disease with heart failure and stage 1 through stage 4 chronic kidney disease, or unspecified chronic kidney disease (HCC) 06/29/2022   Carotid artery stenosis 06/29/2022   Chronic kidney disease 06/29/2022   Atrial fibrillation (HCC) 06/29/2022   Unspecified atrial fibrillation (HCC) 06/29/2022    Bilateral leg edema 05/03/2022   Bradycardia 05/03/2022   Acute on chronic diastolic CHF (congestive heart failure) (HCC) 03/01/2022   NSTEMI (non-ST elevated myocardial infarction) (HCC) 02/28/2022   Carotid stenosis, right 02/26/2022   Abnormal ECG 01/15/2022   Combined forms of age-related cataract, bilateral 12/26/2021   Benign neoplasm of colon 01/13/2020   Elevated prostate specific antigen (PSA) 01/13/2020   Insomnia 01/13/2020   Other ill-defined and unknown causes of morbidity and mortality 01/13/2020   Psychosexual dysfunction with inhibited sexual excitement 01/13/2020   Status post vasectomy 01/13/2020   DDD (degenerative disc disease), lumbar 07/30/2018   Anxiety 07/28/2018   Carotid stenosis 07/28/2018   CKD (chronic kidney disease) stage 3, GFR 30-59 ml/min 07/28/2018   Elevated blood sugar 07/28/2018   Occlusion and stenosis of carotid artery 07/28/2018   Paroxysmal atrial fibrillation (HCC) 07/03/2018   Leg swelling 07/03/2018   Paroxysmal A-fib (HCC) 07/03/2018   Fall from ladder 04/16/2018   Bilateral carotid artery stenosis 10/23/2016   Hypertension 10/23/2016   Hyperlipidemia 10/23/2016   Incomplete bladder emptying 07/02/2015   Benign prostatic hyperplasia 06/23/2015    PCP:  Sadie Manna, MD      REFERRING PROVIDER:  Kathlynn Sharper, MD      REFERRING DIAG:  S46.011A (ICD-10-CM) - Strain of muscle(s) and tendon(s) of the rotator cuff of right shoulder, initial encounter  RATIONALE FOR EVALUATION AND TREATMENT: Rehabilitation  THERAPY DIAG: Chronic right shoulder pain  Muscle weakness (generalized)  ONSET DATE: 6 mos  FOLLOW-UP APPT SCHEDULED WITH REFERRING PROVIDER: No    SUBJECTIVE:                                                                                                                                                                                         SUBJECTIVE STATEMENT:    Patient reports to OPPT with R shoulder  pain.   PERTINENT HISTORY:   Xavier Davis is a 83 y.o. male presenting with R shoulder pain. 6 mos ago patient reports he was pulling on a tree limb and he felt a sudden shock in his R arm. Patient reports that the pain disrupts his sleep cycle throughout the night. Additionally he reports that it painful to lift his arm past 90 degrees. Patient tried ice initially with minimal relief. Patient still continues to use RUE to perform ADLs around the house although pain is present   Patient denies chills/fever, night sweats, nausea  Hand Dominance: R Hand   Imaging: (Per Chart Review 08/24/24):  Dr. Ozell Fairy [place]ake: X-rays conducted on August 12, 2024, revealed moderate AC degenerative changes, joint space narrowing, and some inferior spurring of the glenohumeral joint, as well as AC arthritis.   PAIN:  Pain Intensity: Present: 1/10, Best: 0/10, Worst: 6-7/10 Pain location: R Shoulder  Pain Quality: constant and aching  Radiating: No  Numbness/Tingling: No History of prior shoulder or neck/shoulder injury, pain, surgery, or therapy: No  PRECAUTIONS: None  WEIGHT BEARING RESTRICTIONS: No  FALLS: Has patient fallen in last 6 months? No  Living Environment Lives with: lives with their spouse Lives in: House/apartment Stairs: No Has following equipment at home: None  Prior level of function: Independent  Occupational demands: Retired  Hobbies: Outdoors, Gardening  Patient Goals: Be able to lift my arm without pain and reduce the aching at night.     OBJECTIVE:   Patient Surveys  QUICK DASH  Please rate your ability do the following activities in the last week by selecting the number below the appropriate response.   Activities Rating  Open a tight or new jar.  1 = No difficulty   Do heavy household chores (e.g., wash walls, floors). 1 = No difficulty   Carry a shopping bag or briefcase 1 = No difficulty   Wash your back. 1 = No difficulty   Use a knife to  cut food. 1 = No difficulty   Recreational activities in which you take some force or impact through your arm, shoulder or hand (e.g., golf, hammering,  tennis, etc.). 2 = Mild difficulty  During the past week, to what extent has your arm, shoulder or hand problem interfered with your normal social activities with family, friends, neighbors or groups?  1 = Not at all  During the past week, were you limited in your work or other regular daily activities as a result of your arm, shoulder or hand problem? 2 = Slightly limited  Rate the severity of the following symptoms in the last week: Arm, Shoulder, or hand pain. 2 = Mild  Rate the severity of the following symptoms in the last week: Tingling (pins and needles) in your arm, shoulder or hand. 1 = none  During the past week, how much difficulty have you had sleeping because of the pain in your arm, shoulder or hand?  2 = Mild difficulty   (A QuickDASH score may not be calculated if there is greater than 1 missing item.)  Quick Dash Disability/Symptom Score: 9.1%  Minimally Clinically Important Difference (MCID): 15-20 points  Flavio, F. et al. (2013). Minimally clinically important difference of the disabilities of the arm, shoulder, and hand outcome measures (DASH) and its shortened version (Quick DASH). Journal of Orthopaedic & Sports Physical Therapy, 44(1), 30-39)   Cognition Patient is oriented to person, place, and time.  Recent memory is intact.  Remote memory is intact.  Attention span and concentration are intact.  Expressive speech is intact.  Patient's fund of knowledge is within normal limits for educational level.    Gross Musculoskeletal Assessment Tremor: None Bulk: Normal Tone: Normal  Gait   Posture   Cervical Screen AROM: WFL and painless with overpressure in all planes Sp[place] A (ipsilateral lateral flexion/axial compression): R: Negative    AROM AROM (Normal range in degrees) AROM  Cervical   Flexion (50)   Extension (80)   Right lateral flexion (45)   Left lateral flexion (45)   Right rotation (85)   Left rotation (85)    Right Left  Shoulder    Flexion 130 140  Extension    Abduction [facility]  WFL   External Rotation WFL* WFL   Internal Rotation    Hands Behind Head [facility] WFL  Hands Behind Back [facility] WFL      Elbow    Flexion    Extension    Pronation    Supination    (* = pain; Blank rows = not tested)  UE MMT: MMT (out of 5) Right Left   Cervical (isometric)  Flexion WNL  Extension WNL  Lateral Flexion WNL WNL  Rotation WNL WNL      Shoulder   Flexion 3+* 4+  Extension    Abduction 4-* 4+  External rotation 4-* 5  Internal rotation 5 5  Horizontal abduction    Horizontal adduction    Lower Trapezius    Rhomboids        Elbow  Flexion 5 5  Extension 5 5  Pronation    Supination        Wrist  Flexion    Extension    Radial deviation    Ulnar deviation        MCP  Flexion    Extension    Abduction    Adduction    (* = pain; Blank rows = not tested)  Sensation Grossly intact to light touch bilateral UE as determined by testing dermatomes C2-T2. Proprioception and hot/cold testing deferred on this date.  Reflexes Deferred   Palpation Location LEFT  RIGHT  Subocciptials    Cervical paraspinals    Upper Trapezius  0  Levator Scapulae  0  Rhomboid Major/Minor    Sternoclavicular joint    Acromioclavicular joint    Coracoid process    Long head of biceps    Supraspinatus  0  Infraspinatus  0  Subscapularis  0  Teres Minor    Teres Major    Pectoralis Major    Pectoralis Minor    Anterior Deltoid    Lateral Deltoid    Posterior Deltoid    Latissimus Dorsi    Sternocleidomastoid    (Blank rows = not tested) Graded on 0-4 scale (0 = no pain, 1 = pain, 2 = pain with wincing/grimacing/flinching, 3 = pain with withdrawal, 4 = unwilling to allow palpation), (Blank rows = not tested)   Passive Accessory Intervertebral  Motion Deferred  SPECIAL TESTS Rotator Cuff  Drop Arm Test: Negative Painful Arc (Pain from 60 to 120 degrees scaption): Positive Infraspinatus Muscle Test: Positive If all 3 tests positive, the probability of a full-thickness rotator cuff tear is 91%  TODAY'S TREATMENT  DATE: 09/01/24   Therapeutic Exercise:  Reviewed HEP with return demonstration:   - Standing Isometric Shoulder External Rotation with Doorway 1 x 5 reps x 10s hold - Seated Scapular Retraction - 1 x 20 - Shoulder External Rotation and Scapular Retraction with Resistance  1 x 10 x 2s hold - YTB   PT education on proper activity modification at home in order to reduce further injury to tendon. Additional education on proper technique, sets, frequency of HEP    PATIENT EDUCATION:  Education details: POC, Prognosis, HEP  Person educated: Patient Education method: Explanation, Demonstration, and Handouts Education comprehension: verbalized understanding and returned demonstration   HOME EXERCISE PROGRAM:  Access Code: [email or web address] URL: [email or web address] Date: 09/01/2024 Prepared by: Lonni Pall  Exercises - Standing Isometric Shoulder External Rotation with Doorway  - 1 x daily - 7 x weekly - 3-5 sets - 10s hold - Seated Scapular Retraction  - 1-2 x daily - 3-4 x weekly - 2-3 sets - 10-12 reps - Shoulder External Rotation and Scapular Retraction with Resistance  - 1-2 x daily - 3-4 x weekly - 2-3 sets - 10-12 reps  ASSESSMENT:  CLINICAL IMPRESSION: Xavier Davis is a 83 y.o. male who was seen today for physical therapy evaluation and treatment for R shoulder pain. Patient presenting with deficits in RUE strength, ROM and tolerance to prolonged UE use. Sensation and palpation negative for significant changes. Patient's pain reproduced with resisted external rotation and flexion of R GHJ, he also reported onset of pain with repetitive movements. Additionally weakness was noted with resisted ER  with shoulder flexed to 90 deg. Painful arc and decreased strength with pain in ER consistent with signs and symptoms of suspected RC injury.   PT educated patient on proper activity modification in order to decrease further injury. Initial HEP provided focused on strengthening RC tendon group. Based on today's performance, patient will benefit from skilled PT services in order to reduce further risk of injury and maximize return to PLOF.   OBJECTIVE IMPAIRMENTS: decreased activity tolerance, decreased ROM, decreased strength, and pain.   ACTIVITY LIMITATIONS: carrying, lifting, sleeping, and reach over head  PARTICIPATION LIMITATIONS: yard work  PERSONAL FACTORS: Age, Time since onset of injury/illness/exacerbation, and 3+ comorbidities: Hx of NSTEMI, HTN, CHF are also affecting patient's functional outcome.   REHAB POTENTIAL: Good  CLINICAL DECISION MAKING: Evolving/moderate complexity  EVALUATION COMPLEXITY: Moderate  GOALS: Goals reviewed with patient? Yes  SHORT TERM GOALS: Target date: 09/29/2024  Pt will be independent with HEP to improve strength and decrease shoulder pain to improve pain-free function at home and work. Baseline: 09/01/2024: Initial HEP given Goal status: INITIAL   LONG TERM GOALS: Target date: 10/27/2024  Pt will increase R shoulder flexion to 140 degrees without pain in order to demonstrate limb symmetry and significantly improvements in ROM/pain for overhead ADLs. Baseline: R: 130 deg Goal status: INITIAL  2.  Pt will decrease worst shoulder pain by at least 3 points on the NPRS in order to demonstrate clinically significant reduction in shoulder pain. Baseline: 09/01/2024: 7/10 NPS Goal status: INITIAL  3.  Pt will decrease quick DASH score by at least 8% in order to demonstrate clinically significant reduction in disability related to shoulder pain        Baseline: 09/01/2024: 9.1%/100 (Minimal Disability) Goal status: INITIAL  4. Pt will  increase strength to a 4+ MMT grade with R shoulder flexion, adb, and external rotation in order to demonstrate improvement in strength and function         Baseline: 09/01/2024:  Shoulder  R/L   Flexion 3+* 4+  Extension    Abduction 4-* 4+  External rotation 4-* 5   Goal status: INITIAL  5.  Pt will achieve 6-8 hours of sleep per night without report of awakening from pain in order to demonstrate significant improvements in pain.  Baseline: 09/01/2024: Patient wakes multiple times per night due to pain.  Goal status: INITIAL    PLAN: PT FREQUENCY: 1-2x/week  PT DURATION: 8 weeks  PLANNED INTERVENTIONS: Therapeutic exercises, Therapeutic activity, Neuromuscular re-education, Balance training, Gait training, Patient/Family education, Self Care, Joint mobilization, Joint manipulation, Vestibular training, Canalith repositioning, Orthotic/Fit training, DME instructions, Dry Needling, Electrical stimulation, Spinal manipulation, Spinal mobilization, Cryotherapy, Moist heat, Taping, Traction, Ultrasound, Ionotophoresis 4mg /ml Dexamethasone, Manual therapy, and Re-evaluation.  PLAN FOR NEXT SESSION: Review HEP, Initiate RC strengthening, external rotation focus, Isometrics, review goals   Lonni Pall PT, DPT Physical Therapist- [place]  09/01/2024, 10:59 AM

## 2024-09-03 ENCOUNTER — Ambulatory Visit

## 2024-09-03 DIAGNOSIS — G8929 Other chronic pain: Secondary | ICD-10-CM

## 2024-09-03 DIAGNOSIS — M25511 Pain in right shoulder: Secondary | ICD-10-CM | POA: Diagnosis not present

## 2024-09-03 DIAGNOSIS — M6281 Muscle weakness (generalized): Secondary | ICD-10-CM

## 2024-09-03 NOTE — Therapy (Signed)
 OUTPATIENT PHYSICAL THERAPY SHOULDER/ELBOW TREATMENT  Patient Name: Xavier Davis MRN: [id number] DOB:1940/11/17, 83 y.o., male Today's Date: 09/03/2024  END OF SESSION:  PT End of Session - 09/03/24 1116     Visit Number 2    Number of Visits 17    Date for Recertification  10/27/24    PT Start Time 1115    PT Stop Time 1200    PT Time Calculation (min) 45 min    Activity Tolerance Patient tolerated treatment well    Behavior During Therapy [facility] for tasks assessed/performed           Past Medical History:  Diagnosis Date   Anxiety    Atrial fibrillation (HCC)    BPH (benign prostatic hyperplasia)    Carotid artery disease    s/p left carotid endarterectomy at [facility] in 2014   Carotid stenosis    bilateral   CKD (chronic kidney disease)    Elevated PSA    Fracture, thoracic vertebra (HCC) 05/06/2018   T3,T9 endplate fracture   Hemothorax on left 05/06/2018   HLD (hyperlipidemia)    HTN (hypertension)    Hyperlipidemia    Hypertension    Insomnia    Rib fractures 05/06/2018   Weak urinary stream    Past Surgical History:  Procedure Laterality Date   APPENDECTOMY     CAROTID PTA/STENT INTERVENTION Right 02/26/2022   Procedure: CAROTID PTA/STENT INTERVENTION;  Surgeon: Marea Selinda RAMAN, MD;  Location: [facility] INVASIVE CV LAB;  Service: Cardiovascular;  Laterality: Right;   carotid stenosis Left    ces and stent placement   cataract surgery Bilateral    Patient Active Problem List   Diagnosis Date Noted   Congestive heart failure (HCC) 06/29/2022   Encounter for therapeutic drug level monitoring 06/29/2022   Erectile dysfunction 06/29/2022   History of anemia 06/29/2022   Hypertensive heart and chronic kidney disease with heart failure and stage 1 through stage 4 chronic kidney disease, or unspecified chronic kidney disease (HCC) 06/29/2022   Carotid artery stenosis 06/29/2022   Chronic kidney disease 06/29/2022   Atrial fibrillation (HCC) 06/29/2022   Unspecified  atrial fibrillation (HCC) 06/29/2022   Bilateral leg edema 05/03/2022   Bradycardia 05/03/2022   Acute on chronic diastolic CHF (congestive heart failure) (HCC) 03/01/2022   NSTEMI (non-ST elevated myocardial infarction) (HCC) 02/28/2022   Carotid stenosis, right 02/26/2022   Abnormal ECG 01/15/2022   Combined forms of age-related cataract, bilateral 12/26/2021   Benign neoplasm of colon 01/13/2020   Elevated prostate specific antigen (PSA) 01/13/2020   Insomnia 01/13/2020   Other ill-defined and unknown causes of morbidity and mortality 01/13/2020   Psychosexual dysfunction with inhibited sexual excitement 01/13/2020   Status post vasectomy 01/13/2020   DDD (degenerative disc disease), lumbar 07/30/2018   Anxiety 07/28/2018   Carotid stenosis 07/28/2018   CKD (chronic kidney disease) stage 3, GFR 30-59 ml/min 07/28/2018   Elevated blood sugar 07/28/2018   Occlusion and stenosis of carotid artery 07/28/2018   Paroxysmal atrial fibrillation (HCC) 07/03/2018   Leg swelling 07/03/2018   Paroxysmal A-fib (HCC) 07/03/2018   Fall from ladder 04/16/2018   Bilateral carotid artery stenosis 10/23/2016   Hypertension 10/23/2016   Hyperlipidemia 10/23/2016   Incomplete bladder emptying 07/02/2015   Benign prostatic hyperplasia 06/23/2015    PCP:  Sadie Manna, MD      REFERRING PROVIDER:  Kathlynn Sharper, MD      REFERRING DIAG:  S46.011A (ICD-10-CM) - Strain of muscle(s) and tendon(s) of the rotator  cuff of right shoulder, initial encounter    RATIONALE FOR EVALUATION AND TREATMENT: Rehabilitation  THERAPY DIAG: Chronic right shoulder pain  Muscle weakness (generalized)  ONSET DATE: 6 mos  FOLLOW-UP APPT SCHEDULED WITH REFERRING PROVIDER: No    SUBJECTIVE:                                                                                                                                                                                         SUBJECTIVE STATEMENT:     Patient reports to OPPT with R shoulder pain.   PERTINENT HISTORY:   Xavier Davis is a 83 y.o. male presenting with R shoulder pain. 6 mos ago patient reports he was pulling on a tree limb and he felt a sudden shock in his R arm. Patient reports that the pain disrupts his sleep cycle throughout the night. Additionally he reports that it painful to lift his arm past 90 degrees. Patient tried ice initially with minimal relief. Patient still continues to use RUE to perform ADLs around the house although pain is present   Patient denies chills/fever, night sweats, nausea  Hand Dominance: R Hand   Imaging: (Per Chart Review 08/24/24):  Dr. Ozell Fairy [place]ake: X-rays conducted on August 12, 2024, revealed moderate AC degenerative changes, joint space narrowing, and some inferior spurring of the glenohumeral joint, as well as AC arthritis.   PAIN:  Pain Intensity: Present: 1/10, Best: 0/10, Worst: 6-7/10 Pain location: R Shoulder  Pain Quality: constant and aching  Radiating: No  Numbness/Tingling: No History of prior shoulder or neck/shoulder injury, pain, surgery, or therapy: No  PRECAUTIONS: None  WEIGHT BEARING RESTRICTIONS: No  FALLS: Has patient fallen in last 6 months? No  Living Environment Lives with: lives with their spouse Lives in: House/apartment Stairs: No Has following equipment at home: None  Prior level of function: Independent  Occupational demands: Retired  Hobbies: Outdoors, Gardening  Patient Goals: Be able to lift my arm without pain and reduce the aching at night.     OBJECTIVE:   Patient Surveys  QUICK DASH  Please rate your ability do the following activities in the last week by selecting the number below the appropriate response.   Activities Rating  Open a tight or new jar.  1 = No difficulty   Do heavy household chores (e.g., wash walls, floors). 1 = No difficulty   Carry a shopping bag or briefcase 1 = No difficulty   Wash  your back. 1 = No difficulty   Use a knife to cut food. 1 = No difficulty   Recreational activities in which you take some force or impact  through your arm, shoulder or hand (e.g., golf, hammering, tennis, etc.). 2 = Mild difficulty  During the past week, to what extent has your arm, shoulder or hand problem interfered with your normal social activities with family, friends, neighbors or groups?  1 = Not at all  During the past week, were you limited in your work or other regular daily activities as a result of your arm, shoulder or hand problem? 2 = Slightly limited  Rate the severity of the following symptoms in the last week: Arm, Shoulder, or hand pain. 2 = Mild  Rate the severity of the following symptoms in the last week: Tingling (pins and needles) in your arm, shoulder or hand. 1 = none  During the past week, how much difficulty have you had sleeping because of the pain in your arm, shoulder or hand?  2 = Mild difficulty   (A QuickDASH score may not be calculated if there is greater than 1 missing item.)  Quick Dash Disability/Symptom Score: 9.1%  Minimally Clinically Important Difference (MCID): 15-20 points  Flavio, F. et al. (2013). Minimally clinically important difference of the disabilities of the arm, shoulder, and hand outcome measures (DASH) and its shortened version (Quick DASH). Journal of Orthopaedic & Sports Physical Therapy, 44(1), 30-39)   Cognition Patient is oriented to person, place, and time.  Recent memory is intact.  Remote memory is intact.  Attention span and concentration are intact.  Expressive speech is intact.  Patient's fund of knowledge is within normal limits for educational level.    Gross Musculoskeletal Assessment Tremor: None Bulk: Normal Tone: Normal  Gait   Posture   Cervical Screen AROM: WFL and painless with overpressure in all planes Spurlings A (ipsilateral lateral flexion/axial compression): R: Negative    AROM AROM  (Normal range in degrees) AROM  Cervical  Flexion (50)   Extension (80)   Right lateral flexion (45)   Left lateral flexion (45)   Right rotation (85)   Left rotation (85)    Right Left  Shoulder    Flexion 130 140  Extension    Abduction [facility]  WFL   External Rotation WFL* WFL   Internal Rotation    Hands Behind Head [facility] WFL  Hands Behind Back [facility] WFL      Elbow    Flexion    Extension    Pronation    Supination    (* = pain; Blank rows = not tested)  UE MMT: MMT (out of 5) Right Left   Cervical (isometric)  Flexion WNL  Extension WNL  Lateral Flexion WNL WNL  Rotation WNL WNL      Shoulder   Flexion 3+* 4+  Extension    Abduction 4-* 4+  External rotation 4-* 5  Internal rotation 5 5  Horizontal abduction    Horizontal adduction    Lower Trapezius    Rhomboids        Elbow  Flexion 5 5  Extension 5 5  Pronation    Supination        Wrist  Flexion    Extension    Radial deviation    Ulnar deviation        MCP  Flexion    Extension    Abduction    Adduction    (* = pain; Blank rows = not tested)  Sensation Grossly intact to light touch bilateral UE as determined by testing dermatomes C2-T2. Proprioception and hot/cold testing deferred on this date.  Reflexes  Deferred   Palpation Location LEFT  RIGHT           Subocciptials    Cervical paraspinals    Upper Trapezius  0  Levator Scapulae  0  Rhomboid Major/Minor    Sternoclavicular joint    Acromioclavicular joint    Coracoid process    Long head of biceps    Supraspinatus  0  Infraspinatus  0  Subscapularis  0  Teres Minor    Teres Major    Pectoralis Major    Pectoralis Minor    Anterior Deltoid    Lateral Deltoid    Posterior Deltoid    Latissimus Dorsi    Sternocleidomastoid    (Blank rows = not tested) Graded on 0-4 scale (0 = no pain, 1 = pain, 2 = pain with wincing/grimacing/flinching, 3 = pain with withdrawal, 4 = unwilling to allow palpation), (Blank rows = not  tested)   Passive Accessory Intervertebral Motion Deferred  SPECIAL TESTS Rotator Cuff  Drop Arm Test: Negative Painful Arc (Pain from 60 to 120 degrees scaption): Positive Infraspinatus Muscle Test: Positive If all 3 tests positive, the probability of a full-thickness rotator cuff tear is 91%  TODAY'S TREATMENT  DATE: 09/03/24  Subjective:  Patient reports no pain at the start of the session. No bouts of pain in between session however he didn't perform any yard work. No questions or concerns.   Therapeutic Exercise:  Reviewed HEP:  Standing Isometric Shoulder External Rotation with Doorway  1 x 5 reps x 10s hold  Seated Scapular Retraction  2 x 20   Sidelying Shoulder ER against resistance   1 x 10 - 3#DB   2 x 10 - 2# DB   Standing Shoulder Posterior Capsule Stretch  30s/bout x 2 in order to improve ) tissue extensibility   Therapeutic Activity (focused on improving shoulder strength in order to optimize raking and yard work such as sweeping): UBE - 5 min - 2.5 min Fwd, 2.5 min Retro - Level 14-10 for UE  strength and muscular endurance; PT manually adjusted resistance throughout to patient's tolerance.   Standing Shoulder Row   3 x 10 - Green TB  Tactile Cues for proper form  Standing Shoulder ER against resistance   2 x 10 - R TB   1 x 10 - G TB    Standing Shoulder flexion against resistance    3 x 10 - 3# DB    Standing Shoulder abd. against resistance   1 x 10 - 3# DB    2 x 10 - 4# DB     Standing OH Press with Weighted Dowel   3 x 10 - 8#   PATIENT EDUCATION:  Education details: POC, Prognosis, HEP  Person educated: Patient Education method: Explanation, Demonstration, and Handouts Education comprehension: verbalized understanding and returned demonstration   HOME EXERCISE PROGRAM:  Access Code: [email or web address] URL: [email or web address] Date: 09/01/2024 Prepared by: Lonni Pall  Exercises - Standing Isometric Shoulder  External Rotation with Doorway  - 1 x daily - 7 x weekly - 3-5 sets - 10s hold - Seated Scapular Retraction  - 1-2 x daily - 3-4 x weekly - 2-3 sets - 10-12 reps - Shoulder External Rotation and Scapular Retraction with Resistance  - 1-2 x daily - 3-4 x weekly - 2-3 sets - 10-12 reps  ASSESSMENT:  CLINICAL IMPRESSION:  Patient returned to OPPT for first f/u in management of suspected RC injury to RUE. Reviewed HEP with good return  carryover from initial evaluation. Good tolerance to strengthening exercises today. Provided additional stretch for suspected RC tendinitis with good carryover. PT plans to continue progressively strengthening RC muscles in order to reduce further injury. Based on today's performance, pt will continue to benefit from skilled PT in order to facilitate return to PLOF and improve QoL.   OBJECTIVE IMPAIRMENTS: decreased activity tolerance, decreased ROM, decreased strength, and pain.   ACTIVITY LIMITATIONS: carrying, lifting, sleeping, and reach over head  PARTICIPATION LIMITATIONS: yard work  PERSONAL FACTORS: Age, Time since onset of injury/illness/exacerbation, and 3+ comorbidities: Hx of NSTEMI, HTN, CHF are also affecting patient's functional outcome.   REHAB POTENTIAL: Good  CLINICAL DECISION MAKING: Evolving/moderate complexity  EVALUATION COMPLEXITY: Moderate   GOALS: Goals reviewed with patient? Yes  SHORT TERM GOALS: Target date: 09/29/2024  Pt will be independent with HEP to improve strength and decrease shoulder pain to improve pain-free function at home and work. Baseline: 09/01/2024: Initial HEP given Goal status: INITIAL   LONG TERM GOALS: Target date: 10/27/2024  Pt will increase R shoulder flexion to 140 degrees without pain in order to demonstrate limb symmetry and significantly improvements in ROM/pain for overhead ADLs. Baseline: R: 130 deg Goal status: INITIAL  2.  Pt will decrease worst shoulder pain by at least 3 points on the  NPRS in order to demonstrate clinically significant reduction in shoulder pain. Baseline: 09/01/2024: 7/10 NPS Goal status: INITIAL  3.  Pt will decrease quick DASH score by at least 8% in order to demonstrate clinically significant reduction in disability related to shoulder pain        Baseline: 09/01/2024: 9.1%/100 (Minimal Disability) Goal status: INITIAL  4. Pt will increase strength to a 4+ MMT grade with R shoulder flexion, adb, and external rotation in order to demonstrate improvement in strength and function         Baseline: 09/01/2024:  Shoulder  R/L   Flexion 3+* 4+  Extension    Abduction 4-* 4+  External rotation 4-* 5   Goal status: INITIAL  5.  Pt will achieve 6-8 hours of sleep per night without report of awakening from pain in order to demonstrate significant improvements in pain.  Baseline: 09/01/2024: Patient wakes multiple times per night due to pain.  Goal status: INITIAL    PLAN: PT FREQUENCY: 1-2x/week  PT DURATION: 8 weeks  PLANNED INTERVENTIONS: Therapeutic exercises, Therapeutic activity, Neuromuscular re-education, Balance training, Gait training, Patient/Family education, Self Care, Joint mobilization, Joint manipulation, Vestibular training, Canalith repositioning, Orthotic/Fit training, DME instructions, Dry Needling, Electrical stimulation, Spinal manipulation, Spinal mobilization, Cryotherapy, Moist heat, Taping, Traction, Ultrasound, Ionotophoresis 4mg /ml Dexamethasone, Manual therapy, and Re-evaluation.  PLAN FOR NEXT SESSION: Review HEP, Initiate RC strengthening, external rotation focus, Isometrics, review goals   Lonni Pall PT, DPT Physical Therapist- [place]  09/03/2024, 11:19 AM

## 2024-09-08 ENCOUNTER — Ambulatory Visit: Attending: Orthopedic Surgery

## 2024-09-08 DIAGNOSIS — M6281 Muscle weakness (generalized): Secondary | ICD-10-CM | POA: Insufficient documentation

## 2024-09-08 DIAGNOSIS — G8929 Other chronic pain: Secondary | ICD-10-CM | POA: Insufficient documentation

## 2024-09-08 DIAGNOSIS — M25511 Pain in right shoulder: Secondary | ICD-10-CM | POA: Diagnosis present

## 2024-09-08 NOTE — Therapy (Signed)
 OUTPATIENT PHYSICAL THERAPY SHOULDER/ELBOW TREATMENT  Patient Name: Xavier Davis MRN: [id number] DOB:[birth year], 83 y.o., male Today's Date: 09/08/2024  END OF SESSION:  PT End of Session - 09/08/24 1127     Visit Number 3    Number of Visits 17    Date for Recertification  10/27/24    PT Start Time 1125    PT Stop Time 1150    PT Time Calculation (min) 25 min    Activity Tolerance Patient tolerated treatment well    Behavior During Therapy [facility] for tasks assessed/performed           Past Medical History:  Diagnosis Date   Anxiety    Atrial fibrillation (HCC)    BPH (benign prostatic hyperplasia)    Carotid artery disease    s/p left carotid endarterectomy at [facility] in 2014   Carotid stenosis    bilateral   CKD (chronic kidney disease)    Elevated PSA    Fracture, thoracic vertebra (HCC) 05/06/2018   T3,T9 endplate fracture   Hemothorax on left 05/06/2018   HLD (hyperlipidemia)    HTN (hypertension)    Hyperlipidemia    Hypertension    Insomnia    Rib fractures 05/06/2018   Weak urinary stream    Past Surgical History:  Procedure Laterality Date   APPENDECTOMY     CAROTID PTA/STENT INTERVENTION Right 02/26/2022   Procedure: CAROTID PTA/STENT INTERVENTION;  Surgeon: Marea Selinda RAMAN, MD;  Location: [facility] INVASIVE CV LAB;  Service: Cardiovascular;  Laterality: Right;   carotid stenosis Left    ces and stent placement   cataract surgery Bilateral    Patient Active Problem List   Diagnosis Date Noted   Congestive heart failure (HCC) 06/29/2022   Encounter for therapeutic drug level monitoring 06/29/2022   Erectile dysfunction 06/29/2022   History of anemia 06/29/2022   Hypertensive heart and chronic kidney disease with heart failure and stage 1 through stage 4 chronic kidney disease, or unspecified chronic kidney disease (HCC) 06/29/2022   Carotid artery stenosis 06/29/2022   Chronic kidney disease 06/29/2022   Atrial fibrillation (HCC) 06/29/2022   Unspecified  atrial fibrillation (HCC) 06/29/2022   Bilateral leg edema 05/03/2022   Bradycardia 05/03/2022   Acute on chronic diastolic CHF (congestive heart failure) (HCC) 03/01/2022   NSTEMI (non-ST elevated myocardial infarction) (HCC) 02/28/2022   Carotid stenosis, right 02/26/2022   Abnormal ECG 01/15/2022   Combined forms of age-related cataract, bilateral 12/26/2021   Benign neoplasm of colon 01/13/2020   Elevated prostate specific antigen (PSA) 01/13/2020   Insomnia 01/13/2020   Other ill-defined and unknown causes of morbidity and mortality 01/13/2020   Psychosexual dysfunction with inhibited sexual excitement 01/13/2020   Status post vasectomy 01/13/2020   DDD (degenerative disc disease), lumbar 07/30/2018   Anxiety 07/28/2018   Carotid stenosis 07/28/2018   CKD (chronic kidney disease) stage 3, GFR 30-59 ml/min 07/28/2018   Elevated blood sugar 07/28/2018   Occlusion and stenosis of carotid artery 07/28/2018   Paroxysmal atrial fibrillation (HCC) 07/03/2018   Leg swelling 07/03/2018   Paroxysmal A-fib (HCC) 07/03/2018   Fall from ladder 04/16/2018   Bilateral carotid artery stenosis 10/23/2016   Hypertension 10/23/2016   Hyperlipidemia 10/23/2016   Incomplete bladder emptying 07/02/2015   Benign prostatic hyperplasia 06/23/2015    PCP:  Sadie Manna, MD      REFERRING PROVIDER:  Kathlynn Sharper, MD      REFERRING DIAG:  S46.011A (ICD-10-CM) - Strain of muscle(s) and tendon(s) of the rotator  cuff of right shoulder, initial encounter    RATIONALE FOR EVALUATION AND TREATMENT: Rehabilitation  THERAPY DIAG: Chronic right shoulder pain  Muscle weakness (generalized)  ONSET DATE: 6 mos  FOLLOW-UP APPT SCHEDULED WITH REFERRING PROVIDER: No    SUBJECTIVE:                                                                                                                                                                                         SUBJECTIVE STATEMENT:     Patient reports to OPPT with R shoulder pain.   PERTINENT HISTORY:   Xavier Davis is a 83 y.o. male presenting with R shoulder pain. 6 mos ago patient reports he was pulling on a tree limb and he felt a sudden shock in his R arm. Patient reports that the pain disrupts his sleep cycle throughout the night. Additionally he reports that it painful to lift his arm past 90 degrees. Patient tried ice initially with minimal relief. Patient still continues to use RUE to perform ADLs around the house although pain is present   Patient denies chills/fever, night sweats, nausea  Hand Dominance: R Hand   Imaging: (Per Chart Review 08/24/24):  Dr. Ozell Fairy [place]ake: X-rays conducted on August 12, 2024, revealed moderate AC degenerative changes, joint space narrowing, and some inferior spurring of the glenohumeral joint, as well as AC arthritis.   PAIN:  Pain Intensity: Present: 1/10, Best: 0/10, Worst: 6-7/10 Pain location: R Shoulder  Pain Quality: constant and aching  Radiating: No  Numbness/Tingling: No History of prior shoulder or neck/shoulder injury, pain, surgery, or therapy: No  PRECAUTIONS: None  WEIGHT BEARING RESTRICTIONS: No  FALLS: Has patient fallen in last 6 months? No  Living Environment Lives with: lives with their spouse Lives in: House/apartment Stairs: No Has following equipment at home: None  Prior level of function: Independent  Occupational demands: Retired  Hobbies: Outdoors, Gardening  Patient Goals: Be able to lift my arm without pain and reduce the aching at night.     OBJECTIVE:   Patient Surveys  QUICK DASH  Please rate your ability do the following activities in the last week by selecting the number below the appropriate response.   Activities Rating  Open a tight or new jar.  1 = No difficulty   Do heavy household chores (e.g., wash walls, floors). 1 = No difficulty   Carry a shopping bag or briefcase 1 = No difficulty   Wash  your back. 1 = No difficulty   Use a knife to cut food. 1 = No difficulty   Recreational activities in which you take some force or impact  through your arm, shoulder or hand (e.g., golf, hammering, tennis, etc.). 2 = Mild difficulty  During the past week, to what extent has your arm, shoulder or hand problem interfered with your normal social activities with family, friends, neighbors or groups?  1 = Not at all  During the past week, were you limited in your work or other regular daily activities as a result of your arm, shoulder or hand problem? 2 = Slightly limited  Rate the severity of the following symptoms in the last week: Arm, Shoulder, or hand pain. 2 = Mild  Rate the severity of the following symptoms in the last week: Tingling (pins and needles) in your arm, shoulder or hand. 1 = none  During the past week, how much difficulty have you had sleeping because of the pain in your arm, shoulder or hand?  2 = Mild difficulty   (A QuickDASH score may not be calculated if there is greater than 1 missing item.)  Quick Dash Disability/Symptom Score: 9.1%  Minimally Clinically Important Difference (MCID): 15-20 points  Flavio, F. et al. (2013). Minimally clinically important difference of the disabilities of the arm, shoulder, and hand outcome measures (DASH) and its shortened version (Quick DASH). Journal of Orthopaedic & Sports Physical Therapy, 44(1), 30-39)   Cognition Patient is oriented to person, place, and time.  Recent memory is intact.  Remote memory is intact.  Attention span and concentration are intact.  Expressive speech is intact.  Patient's fund of knowledge is within normal limits for educational level.    Gross Musculoskeletal Assessment Tremor: None Bulk: Normal Tone: Normal  Gait   Posture   Cervical Screen AROM: WFL and painless with overpressure in all planes Spurlings A (ipsilateral lateral flexion/axial compression): R: Negative    AROM AROM  (Normal range in degrees) AROM  Cervical  Flexion (50)   Extension (80)   Right lateral flexion (45)   Left lateral flexion (45)   Right rotation (85)   Left rotation (85)    Right Left  Shoulder    Flexion 130 140  Extension    Abduction [facility]  WFL   External Rotation WFL* WFL   Internal Rotation    Hands Behind Head [facility] WFL  Hands Behind Back [facility] WFL      Elbow    Flexion    Extension    Pronation    Supination    (* = pain; Blank rows = not tested)  UE MMT: MMT (out of 5) Right Left   Cervical (isometric)  Flexion WNL  Extension WNL  Lateral Flexion WNL WNL  Rotation WNL WNL      Shoulder   Flexion 3+* 4+  Extension    Abduction 4-* 4+  External rotation 4-* 5  Internal rotation 5 5  Horizontal abduction    Horizontal adduction    Lower Trapezius    Rhomboids        Elbow  Flexion 5 5  Extension 5 5  Pronation    Supination        Wrist  Flexion    Extension    Radial deviation    Ulnar deviation        MCP  Flexion    Extension    Abduction    Adduction    (* = pain; Blank rows = not tested)  Sensation Grossly intact to light touch bilateral UE as determined by testing dermatomes C2-T2. Proprioception and hot/cold testing deferred on this date.  Reflexes  Deferred   Palpation Location LEFT  RIGHT           Subocciptials    Cervical paraspinals    Upper Trapezius  0  Levator Scapulae  0  Rhomboid Major/Minor    Sternoclavicular joint    Acromioclavicular joint    Coracoid process    Long head of biceps    Supraspinatus  0  Infraspinatus  0  Subscapularis  0  Teres Minor    Teres Major    Pectoralis Major    Pectoralis Minor    Anterior Deltoid    Lateral Deltoid    Posterior Deltoid    Latissimus Dorsi    Sternocleidomastoid    (Blank rows = not tested) Graded on 0-4 scale (0 = no pain, 1 = pain, 2 = pain with wincing/grimacing/flinching, 3 = pain with withdrawal, 4 = unwilling to allow palpation), (Blank rows = not  tested)   Passive Accessory Intervertebral Motion Deferred  SPECIAL TESTS Rotator Cuff  Drop Arm Test: Negative Painful Arc (Pain from 60 to 120 degrees scaption): Positive Infraspinatus Muscle Test: Positive If all 3 tests positive, the probability of a full-thickness rotator cuff tear is 91%  TODAY'S TREATMENT  DATE: 09/08/24  Subjective:  Patient reports that he had moderate soreness following last PT session. He reports it was muscular pain and it took 3 days for full resolution. Currently 4/10 in the R shoulder.  No questions or concerns.   Therapeutic Activity (focused on improving shoulder strength in order to optimize raking and yard work such as sweeping): UBE - 5 min - 2.5 min Fwd, 2.5 min Retro - Level 12-10 for UE  strength and muscular endurance; PT manually adjusted resistance throughout to patient's tolerance.   Standing Shoulder Row   3 x 10 - Green TB  Standing Shoulder ER (neutral elbow)   3 x 10 - Red TB   Standing Shoulder Stabilization    Green Ball against wall - RUR 3 x 10 CW/CCW ea dir    Sidelying ER against DB    R 1 x 10 - 3# DB    R 2 x 10 - 2# DB       PATIENT EDUCATION:  Education details: POC, Prognosis, HEP  Person educated: Patient Education method: Explanation, Demonstration, and Handouts Education comprehension: verbalized understanding and returned demonstration   HOME EXERCISE PROGRAM:  Access Code: [email or web address] URL: [email or web address] Date: 09/01/2024 Prepared by: Lonni Pall  Exercises - Standing Isometric Shoulder External Rotation with Doorway  - 1 x daily - 7 x weekly - 3-5 sets - 10s hold - Seated Scapular Retraction  - 1-2 x daily - 3-4 x weekly - 2-3 sets - 10-12 reps - Shoulder External Rotation and Scapular Retraction with Resistance  - 1-2 x daily - 3-4 x weekly - 2-3 sets - 10-12 reps  ASSESSMENT:  CLINICAL IMPRESSION: Patient returned to OPPT in management of R shoulder pain secondary to RC  weakness. PT decreased intensity due to moderate soreness following last PT session. Session limited per patient request for another appointment. Continued strengthening R RC group within pt tolerance. PT plans to continue progressively strengthening RC muscles in order to reduce further injury. Based on today's performance, pt will continue to benefit from skilled PT in order to facilitate return to PLOF and improve QoL.   OBJECTIVE IMPAIRMENTS: decreased activity tolerance, decreased ROM, decreased strength, and pain.   ACTIVITY LIMITATIONS: carrying, lifting, sleeping, and reach over head  PARTICIPATION LIMITATIONS: yard work  PERSONAL FACTORS: Age, Time since onset of injury/illness/exacerbation, and 3+ comorbidities: Hx of NSTEMI, HTN, CHF are also affecting patient's functional outcome.   REHAB POTENTIAL: Good  CLINICAL DECISION MAKING: Evolving/moderate complexity  EVALUATION COMPLEXITY: Moderate   GOALS: Goals reviewed with patient? Yes  SHORT TERM GOALS: Target date: 09/29/2024  Pt will be independent with HEP to improve strength and decrease shoulder pain to improve pain-free function at home and work. Baseline: 09/01/2024: Initial HEP given Goal status: INITIAL   LONG TERM GOALS: Target date: 10/27/2024  Pt will increase R shoulder flexion to 140 degrees without pain in order to demonstrate limb symmetry and significantly improvements in ROM/pain for overhead ADLs. Baseline: R: 130 deg Goal status: INITIAL  2.  Pt will decrease worst shoulder pain by at least 3 points on the NPRS in order to demonstrate clinically significant reduction in shoulder pain. Baseline: 09/01/2024: 7/10 NPS Goal status: INITIAL  3.  Pt will decrease quick DASH score by at least 8% in order to demonstrate clinically significant reduction in disability related to shoulder pain        Baseline: 09/01/2024: 9.1%/100 (Minimal Disability) Goal status: INITIAL  4. Pt will increase strength to a  4+ MMT grade with R shoulder flexion, adb, and external rotation in order to demonstrate improvement in strength and function         Baseline: 09/01/2024:  Shoulder  R/L   Flexion 3+* 4+  Extension    Abduction 4-* 4+  External rotation 4-* 5   Goal status: INITIAL  5.  Pt will achieve 6-8 hours of sleep per night without report of awakening from pain in order to demonstrate significant improvements in pain.  Baseline: 09/01/2024: Patient wakes multiple times per night due to pain.  Goal status: INITIAL    PLAN: PT FREQUENCY: 1-2x/week  PT DURATION: 8 weeks  PLANNED INTERVENTIONS: Therapeutic exercises, Therapeutic activity, Neuromuscular re-education, Balance training, Gait training, Patient/Family education, Self Care, Joint mobilization, Joint manipulation, Vestibular training, Canalith repositioning, Orthotic/Fit training, DME instructions, Dry Needling, Electrical stimulation, Spinal manipulation, Spinal mobilization, Cryotherapy, Moist heat, Taping, Traction, Ultrasound, Ionotophoresis 4mg /ml Dexamethasone, Manual therapy, and Re-evaluation.  PLAN FOR NEXT SESSION: Review HEP, Initiate RC strengthening, external rotation focus, Isometrics, review goals   Lonni Pall PT, DPT Physical Therapist- [place]  09/08/2024, 11:27 AM

## 2024-09-10 ENCOUNTER — Ambulatory Visit

## 2024-09-10 DIAGNOSIS — M25511 Pain in right shoulder: Secondary | ICD-10-CM | POA: Diagnosis not present

## 2024-09-10 DIAGNOSIS — G8929 Other chronic pain: Secondary | ICD-10-CM

## 2024-09-10 DIAGNOSIS — M6281 Muscle weakness (generalized): Secondary | ICD-10-CM

## 2024-09-10 NOTE — Therapy (Signed)
 OUTPATIENT PHYSICAL THERAPY SHOULDER/ELBOW TREATMENT  Patient Name: Xavier Davis MRN: [id number] DOB:February 21, 1941, 83 y.o., male Today's Date: 09/10/2024  END OF SESSION:  PT End of Session - 09/10/24 1651     Visit Number 4    Number of Visits 17    Date for Recertification  10/27/24    PT Start Time 1646    PT Stop Time 1728    PT Time Calculation (min) 42 min    Activity Tolerance Patient tolerated treatment well    Behavior During Therapy [facility] for tasks assessed/performed           Past Medical History:  Diagnosis Date   Anxiety    Atrial fibrillation (HCC)    BPH (benign prostatic hyperplasia)    Carotid artery disease    s/p left carotid endarterectomy at [facility] in 2014   Carotid stenosis    bilateral   CKD (chronic kidney disease)    Elevated PSA    Fracture, thoracic vertebra (HCC) 05/06/2018   T3,T9 endplate fracture   Hemothorax on left 05/06/2018   HLD (hyperlipidemia)    HTN (hypertension)    Hyperlipidemia    Hypertension    Insomnia    Rib fractures 05/06/2018   Weak urinary stream    Past Surgical History:  Procedure Laterality Date   APPENDECTOMY     CAROTID PTA/STENT INTERVENTION Right 02/26/2022   Procedure: CAROTID PTA/STENT INTERVENTION;  Surgeon: Marea Selinda RAMAN, MD;  Location: [facility] INVASIVE CV LAB;  Service: Cardiovascular;  Laterality: Right;   carotid stenosis Left    ces and stent placement   cataract surgery Bilateral    Patient Active Problem List   Diagnosis Date Noted   Congestive heart failure (HCC) 06/29/2022   Encounter for therapeutic drug level monitoring 06/29/2022   Erectile dysfunction 06/29/2022   History of anemia 06/29/2022   Hypertensive heart and chronic kidney disease with heart failure and stage 1 through stage 4 chronic kidney disease, or unspecified chronic kidney disease (HCC) 06/29/2022   Carotid artery stenosis 06/29/2022   Chronic kidney disease 06/29/2022   Atrial fibrillation (HCC) 06/29/2022   Unspecified  atrial fibrillation (HCC) 06/29/2022   Bilateral leg edema 05/03/2022   Bradycardia 05/03/2022   Acute on chronic diastolic CHF (congestive heart failure) (HCC) 03/01/2022   NSTEMI (non-ST elevated myocardial infarction) (HCC) 02/28/2022   Carotid stenosis, right 02/26/2022   Abnormal ECG 01/15/2022   Combined forms of age-related cataract, bilateral 12/26/2021   Benign neoplasm of colon 01/13/2020   Elevated prostate specific antigen (PSA) 01/13/2020   Insomnia 01/13/2020   Other ill-defined and unknown causes of morbidity and mortality 01/13/2020   Psychosexual dysfunction with inhibited sexual excitement 01/13/2020   Status post vasectomy 01/13/2020   DDD (degenerative disc disease), lumbar 07/30/2018   Anxiety 07/28/2018   Carotid stenosis 07/28/2018   CKD (chronic kidney disease) stage 3, GFR 30-59 ml/min 07/28/2018   Elevated blood sugar 07/28/2018   Occlusion and stenosis of carotid artery 07/28/2018   Paroxysmal atrial fibrillation (HCC) 07/03/2018   Leg swelling 07/03/2018   Paroxysmal A-fib (HCC) 07/03/2018   Fall from ladder 04/16/2018   Bilateral carotid artery stenosis 10/23/2016   Hypertension 10/23/2016   Hyperlipidemia 10/23/2016   Incomplete bladder emptying 07/02/2015   Benign prostatic hyperplasia 06/23/2015    PCP:  Sadie Manna, MD      REFERRING PROVIDER:  Kathlynn Sharper, MD      REFERRING DIAG:  S46.011A (ICD-10-CM) - Strain of muscle(s) and tendon(s) of the rotator  cuff of right shoulder, initial encounter    RATIONALE FOR EVALUATION AND TREATMENT: Rehabilitation  THERAPY DIAG: Chronic right shoulder pain  Muscle weakness (generalized)  ONSET DATE: 6 mos  FOLLOW-UP APPT SCHEDULED WITH REFERRING PROVIDER: No    SUBJECTIVE:                                                                                                                                                                                         SUBJECTIVE STATEMENT:     Patient reports to OPPT with R shoulder pain.   PERTINENT HISTORY:   Xavier Davis is a 83 y.o. male presenting with R shoulder pain. 6 mos ago patient reports he was pulling on a tree limb and he felt a sudden shock in his R arm. Patient reports that the pain disrupts his sleep cycle throughout the night. Additionally he reports that it painful to lift his arm past 90 degrees. Patient tried ice initially with minimal relief. Patient still continues to use RUE to perform ADLs around the house although pain is present   Patient denies chills/fever, night sweats, nausea  Hand Dominance: R Hand   Imaging: (Per Chart Review 08/24/24):  Dr. Ozell Fairy [place]ake: X-rays conducted on August 12, 2024, revealed moderate AC degenerative changes, joint space narrowing, and some inferior spurring of the glenohumeral joint, as well as AC arthritis.   PAIN:  Pain Intensity: Present: 1/10, Best: 0/10, Worst: 6-7/10 Pain location: R Shoulder  Pain Quality: constant and aching  Radiating: No  Numbness/Tingling: No History of prior shoulder or neck/shoulder injury, pain, surgery, or therapy: No  PRECAUTIONS: None  WEIGHT BEARING RESTRICTIONS: No  FALLS: Has patient fallen in last 6 months? No  Living Environment Lives with: lives with their spouse Lives in: House/apartment Stairs: No Has following equipment at home: None  Prior level of function: Independent  Occupational demands: Retired  Hobbies: Outdoors, Gardening  Patient Goals: Be able to lift my arm without pain and reduce the aching at night.     OBJECTIVE:   Patient Surveys  QUICK DASH  Please rate your ability do the following activities in the last week by selecting the number below the appropriate response.   Activities Rating  Open a tight or new jar.  1 = No difficulty   Do heavy household chores (e.g., wash walls, floors). 1 = No difficulty   Carry a shopping bag or briefcase 1 = No difficulty   Wash  your back. 1 = No difficulty   Use a knife to cut food. 1 = No difficulty   Recreational activities in which you take some force or impact  through your arm, shoulder or hand (e.g., golf, hammering, tennis, etc.). 2 = Mild difficulty  During the past week, to what extent has your arm, shoulder or hand problem interfered with your normal social activities with family, friends, neighbors or groups?  1 = Not at all  During the past week, were you limited in your work or other regular daily activities as a result of your arm, shoulder or hand problem? 2 = Slightly limited  Rate the severity of the following symptoms in the last week: Arm, Shoulder, or hand pain. 2 = Mild  Rate the severity of the following symptoms in the last week: Tingling (pins and needles) in your arm, shoulder or hand. 1 = none  During the past week, how much difficulty have you had sleeping because of the pain in your arm, shoulder or hand?  2 = Mild difficulty   (A QuickDASH score may not be calculated if there is greater than 1 missing item.)  Quick Dash Disability/Symptom Score: 9.1%  Minimally Clinically Important Difference (MCID): 15-20 points  Flavio, F. et al. (2013). Minimally clinically important difference of the disabilities of the arm, shoulder, and hand outcome measures (DASH) and its shortened version (Quick DASH). Journal of Orthopaedic & Sports Physical Therapy, 44(1), 30-39)   Cognition Patient is oriented to person, place, and time.  Recent memory is intact.  Remote memory is intact.  Attention span and concentration are intact.  Expressive speech is intact.  Patient's fund of knowledge is within normal limits for educational level.    Gross Musculoskeletal Assessment Tremor: None Bulk: Normal Tone: Normal  Gait   Posture   Cervical Screen AROM: WFL and painless with overpressure in all planes Spurlings A (ipsilateral lateral flexion/axial compression): R: Negative    AROM AROM  (Normal range in degrees) AROM  Cervical  Flexion (50)   Extension (80)   Right lateral flexion (45)   Left lateral flexion (45)   Right rotation (85)   Left rotation (85)    Right Left  Shoulder    Flexion 130 140  Extension    Abduction [facility]  WFL   External Rotation WFL* WFL   Internal Rotation    Hands Behind Head [facility] WFL  Hands Behind Back [facility] WFL      Elbow    Flexion    Extension    Pronation    Supination    (* = pain; Blank rows = not tested)  UE MMT: MMT (out of 5) Right Left   Cervical (isometric)  Flexion WNL  Extension WNL  Lateral Flexion WNL WNL  Rotation WNL WNL      Shoulder   Flexion 3+* 4+  Extension    Abduction 4-* 4+  External rotation 4-* 5  Internal rotation 5 5  Horizontal abduction    Horizontal adduction    Lower Trapezius    Rhomboids        Elbow  Flexion 5 5  Extension 5 5  Pronation    Supination        Wrist  Flexion    Extension    Radial deviation    Ulnar deviation        MCP  Flexion    Extension    Abduction    Adduction    (* = pain; Blank rows = not tested)  Sensation Grossly intact to light touch bilateral UE as determined by testing dermatomes C2-T2. Proprioception and hot/cold testing deferred on this date.  Reflexes  Deferred   Palpation Location LEFT  RIGHT           Subocciptials    Cervical paraspinals    Upper Trapezius  0  Levator Scapulae  0  Rhomboid Major/Minor    Sternoclavicular joint    Acromioclavicular joint    Coracoid process    Long head of biceps    Supraspinatus  0  Infraspinatus  0  Subscapularis  0  Teres Minor    Teres Major    Pectoralis Major    Pectoralis Minor    Anterior Deltoid    Lateral Deltoid    Posterior Deltoid    Latissimus Dorsi    Sternocleidomastoid    (Blank rows = not tested) Graded on 0-4 scale (0 = no pain, 1 = pain, 2 = pain with wincing/grimacing/flinching, 3 = pain with withdrawal, 4 = unwilling to allow palpation), (Blank rows = not  tested)   Passive Accessory Intervertebral Motion Deferred  SPECIAL TESTS Rotator Cuff  Drop Arm Test: Negative Painful Arc (Pain from 60 to 120 degrees scaption): Positive Infraspinatus Muscle Test: Positive If all 3 tests positive, the probability of a full-thickness rotator cuff tear is 91%  TODAY'S TREATMENT  DATE: 09/10/24  Subjective:  Patient reports that he had moderate soreness following last PT session. He reports it was muscular pain and it took 3 days for full resolution. Currently 4/10 in the R shoulder.  No questions or concerns.   Therapeutic Activity (focused on improving shoulder strength in order to optimize raking, lifting and yard work such as sweeping): UBE - 5 min - 2.5 min Fwd, 2.5 min Retro - Level 14-10 for UE  strength and muscular endurance; PT manually adjusted resistance throughout to patient's tolerance.   Standing Shoulder ER (neutral elbow)   3 x 10 - Red TB    Elevated Push Up on Mat table and bolster    2 x 8    Walking with KB - Bottom Up    4 x 57m - 10# KB  Therapeutic Exercise:  Seated Scapular Row    3 x 10 - 15#       Bicep Curl    3 x 10 - 10#   Standing Low Row    3 x 10 - 10#    Door way Stretch in order to decrease rounded shoulders   3 x 15s   R Sleeper Stretch for rotator cuff stretch/posterior capsule   3 x 15s   PATIENT EDUCATION:  Education details: POC, Prognosis, HEP  Person educated: Patient Education method: Programmer, Multimedia, Demonstration, and Handouts Education comprehension: verbalized understanding and returned demonstration   HOME EXERCISE PROGRAM:  Access Code: [email or web address] URL: [email or web address] Date: 09/01/2024 Prepared by: Lonni Pall  Exercises - Standing Isometric Shoulder External Rotation with Doorway  - 1 x daily - 7 x weekly - 3-5 sets - 10s hold - Seated Scapular Retraction  - 1-2 x daily - 3-4 x weekly - 2-3 sets - 10-12 reps - Shoulder External Rotation and Scapular  Retraction with Resistance  - 1-2 x daily - 3-4 x weekly - 2-3 sets - 10-12 reps  ASSESSMENT:  CLINICAL IMPRESSION: Patient returned to OPPT in management of R shoulder pain secondary to RC weakness. Session focused on OKC stabilization and UE strengthening in order to optimize functional tasks at home. He tolerated all interventions without exacerbation of pain in the R shoulder. Patient's pain with minimal improvements since beginning POC but he continues to adhere to HEP  and minimize excessive UE repetitive tasks. PT plans to continue progressively strengthening RC muscles as tolerated. Based on today's performance, pt will continue to benefit from skilled PT in order to facilitate return to PLOF and improve QoL.   OBJECTIVE IMPAIRMENTS: decreased activity tolerance, decreased ROM, decreased strength, and pain.   ACTIVITY LIMITATIONS: carrying, lifting, sleeping, and reach over head  PARTICIPATION LIMITATIONS: yard work  PERSONAL FACTORS: Age, Time since onset of injury/illness/exacerbation, and 3+ comorbidities: Hx of NSTEMI, HTN, CHF are also affecting patient's functional outcome.   REHAB POTENTIAL: Good  CLINICAL DECISION MAKING: Evolving/moderate complexity  EVALUATION COMPLEXITY: Moderate   GOALS: Goals reviewed with patient? Yes  SHORT TERM GOALS: Target date: 09/29/2024  Pt will be independent with HEP to improve strength and decrease shoulder pain to improve pain-free function at home and work. Baseline: 09/01/2024: Initial HEP given Goal status: INITIAL   LONG TERM GOALS: Target date: 10/27/2024  Pt will increase R shoulder flexion to 140 degrees without pain in order to demonstrate limb symmetry and significantly improvements in ROM/pain for overhead ADLs. Baseline: R: 130 deg Goal status: INITIAL  2.  Pt will decrease worst shoulder pain by at least 3 points on the NPRS in order to demonstrate clinically significant reduction in shoulder pain. Baseline:  09/01/2024: 7/10 NPS Goal status: INITIAL  3.  Pt will decrease quick DASH score by at least 8% in order to demonstrate clinically significant reduction in disability related to shoulder pain        Baseline: 09/01/2024: 9.1%/100 (Minimal Disability) Goal status: INITIAL  4. Pt will increase strength to a 4+ MMT grade with R shoulder flexion, adb, and external rotation in order to demonstrate improvement in strength and function         Baseline: 09/01/2024:  Shoulder  R/L   Flexion 3+* 4+  Extension    Abduction 4-* 4+  External rotation 4-* 5   Goal status: INITIAL  5.  Pt will achieve 6-8 hours of sleep per night without report of awakening from pain in order to demonstrate significant improvements in pain.  Baseline: 09/01/2024: Patient wakes multiple times per night due to pain.  Goal status: INITIAL    PLAN: PT FREQUENCY: 1-2x/week  PT DURATION: 8 weeks  PLANNED INTERVENTIONS: Therapeutic exercises, Therapeutic activity, Neuromuscular re-education, Balance training, Gait training, Patient/Family education, Self Care, Joint mobilization, Joint manipulation, Vestibular training, Canalith repositioning, Orthotic/Fit training, DME instructions, Dry Needling, Electrical stimulation, Spinal manipulation, Spinal mobilization, Cryotherapy, Moist heat, Taping, Traction, Ultrasound, Ionotophoresis 4mg /ml Dexamethasone, Manual therapy, and Re-evaluation.  PLAN FOR NEXT SESSION: Review HEP, Progress RC strengthening, external rotation focus, Isometrics, review goals   Lonni Pall PT, DPT Physical Therapist- [place]  09/10/2024, 9:03 PM

## 2024-09-15 ENCOUNTER — Ambulatory Visit

## 2024-09-15 DIAGNOSIS — M6281 Muscle weakness (generalized): Secondary | ICD-10-CM

## 2024-09-15 DIAGNOSIS — G8929 Other chronic pain: Secondary | ICD-10-CM

## 2024-09-15 DIAGNOSIS — M25511 Pain in right shoulder: Secondary | ICD-10-CM | POA: Diagnosis not present

## 2024-09-15 NOTE — Therapy (Signed)
 OUTPATIENT PHYSICAL THERAPY SHOULDER/ELBOW TREATMENT  Patient Name: Xavier Davis MRN: [id number] DOB:1941-05-24, 83 y.o., male Today's Date: 09/15/2024  END OF SESSION:  PT End of Session - 09/15/24 1306     Visit Number 5    Number of Visits 17    Date for Recertification  10/27/24    PT Start Time 1301    PT Stop Time 1342    PT Time Calculation (min) 41 min    Activity Tolerance Patient tolerated treatment well    Behavior During Therapy [facility] for tasks assessed/performed            Past Medical History:  Diagnosis Date   Anxiety    Atrial fibrillation (HCC)    BPH (benign prostatic hyperplasia)    Carotid artery disease    s/p left carotid endarterectomy at [facility] in 2014   Carotid stenosis    bilateral   CKD (chronic kidney disease)    Elevated PSA    Fracture, thoracic vertebra (HCC) 05/06/2018   T3,T9 endplate fracture   Hemothorax on left 05/06/2018   HLD (hyperlipidemia)    HTN (hypertension)    Hyperlipidemia    Hypertension    Insomnia    Rib fractures 05/06/2018   Weak urinary stream    Past Surgical History:  Procedure Laterality Date   APPENDECTOMY     CAROTID PTA/STENT INTERVENTION Right 02/26/2022   Procedure: CAROTID PTA/STENT INTERVENTION;  Surgeon: Marea Selinda RAMAN, MD;  Location: [facility] INVASIVE CV LAB;  Service: Cardiovascular;  Laterality: Right;   carotid stenosis Left    ces and stent placement   cataract surgery Bilateral    Patient Active Problem List   Diagnosis Date Noted   Congestive heart failure (HCC) 06/29/2022   Encounter for therapeutic drug level monitoring 06/29/2022   Erectile dysfunction 06/29/2022   History of anemia 06/29/2022   Hypertensive heart and chronic kidney disease with heart failure and stage 1 through stage 4 chronic kidney disease, or unspecified chronic kidney disease (HCC) 06/29/2022   Carotid artery stenosis 06/29/2022   Chronic kidney disease 06/29/2022   Atrial fibrillation (HCC) 06/29/2022   Unspecified  atrial fibrillation (HCC) 06/29/2022   Bilateral leg edema 05/03/2022   Bradycardia 05/03/2022   Acute on chronic diastolic CHF (congestive heart failure) (HCC) 03/01/2022   NSTEMI (non-ST elevated myocardial infarction) (HCC) 02/28/2022   Carotid stenosis, right 02/26/2022   Abnormal ECG 01/15/2022   Combined forms of age-related cataract, bilateral 12/26/2021   Benign neoplasm of colon 01/13/2020   Elevated prostate specific antigen (PSA) 01/13/2020   Insomnia 01/13/2020   Other ill-defined and unknown causes of morbidity and mortality 01/13/2020   Psychosexual dysfunction with inhibited sexual excitement 01/13/2020   Status post vasectomy 01/13/2020   DDD (degenerative disc disease), lumbar 07/30/2018   Anxiety 07/28/2018   Carotid stenosis 07/28/2018   CKD (chronic kidney disease) stage 3, GFR 30-59 ml/min 07/28/2018   Elevated blood sugar 07/28/2018   Occlusion and stenosis of carotid artery 07/28/2018   Paroxysmal atrial fibrillation (HCC) 07/03/2018   Leg swelling 07/03/2018   Paroxysmal A-fib (HCC) 07/03/2018   Fall from ladder 04/16/2018   Bilateral carotid artery stenosis 10/23/2016   Hypertension 10/23/2016   Hyperlipidemia 10/23/2016   Incomplete bladder emptying 07/02/2015   Benign prostatic hyperplasia 06/23/2015    PCP:  Sadie Manna, MD      REFERRING PROVIDER:  Kathlynn Sharper, MD      REFERRING DIAG:  S46.011A (ICD-10-CM) - Strain of muscle(s) and tendon(s) of the  rotator cuff of right shoulder, initial encounter    RATIONALE FOR EVALUATION AND TREATMENT: Rehabilitation  THERAPY DIAG: Chronic right shoulder pain  Muscle weakness (generalized)  ONSET DATE: 6 mos  FOLLOW-UP APPT SCHEDULED WITH REFERRING PROVIDER: No    SUBJECTIVE:                                                                                                                                                                                         SUBJECTIVE STATEMENT:     Patient reports to OPPT with R shoulder pain.   PERTINENT HISTORY:   Xavier Davis is a 83 y.o. male presenting with R shoulder pain. 6 mos ago patient reports he was pulling on a tree limb and he felt a sudden shock in his R arm. Patient reports that the pain disrupts his sleep cycle throughout the night. Additionally he reports that it painful to lift his arm past 90 degrees. Patient tried ice initially with minimal relief. Patient still continues to use RUE to perform ADLs around the house although pain is present   Patient denies chills/fever, night sweats, nausea  Hand Dominance: R Hand   Imaging: (Per Chart Review 08/24/24):  Dr. Ozell Fairy [place]ake: X-rays conducted on August 12, 2024, revealed moderate AC degenerative changes, joint space narrowing, and some inferior spurring of the glenohumeral joint, as well as AC arthritis.   PAIN:  Pain Intensity: Present: 1/10, Best: 0/10, Worst: 6-7/10 Pain location: R Shoulder  Pain Quality: constant and aching  Radiating: No  Numbness/Tingling: No History of prior shoulder or neck/shoulder injury, pain, surgery, or therapy: No  PRECAUTIONS: None  WEIGHT BEARING RESTRICTIONS: No  FALLS: Has patient fallen in last 6 months? No  Living Environment Lives with: lives with their spouse Lives in: House/apartment Stairs: No Has following equipment at home: None  Prior level of function: Independent  Occupational demands: Retired  Hobbies: Outdoors, Gardening  Patient Goals: Be able to lift my arm without pain and reduce the aching at night.     OBJECTIVE:   Patient Surveys  QUICK DASH  Please rate your ability do the following activities in the last week by selecting the number below the appropriate response.   Activities Rating  Open a tight or new jar.  1 = No difficulty   Do heavy household chores (e.g., wash walls, floors). 1 = No difficulty   Carry a shopping bag or briefcase 1 = No difficulty   Wash  your back. 1 = No difficulty   Use a knife to cut food. 1 = No difficulty   Recreational activities in which you take some force or  impact through your arm, shoulder or hand (e.g., golf, hammering, tennis, etc.). 2 = Mild difficulty  During the past week, to what extent has your arm, shoulder or hand problem interfered with your normal social activities with family, friends, neighbors or groups?  1 = Not at all  During the past week, were you limited in your work or other regular daily activities as a result of your arm, shoulder or hand problem? 2 = Slightly limited  Rate the severity of the following symptoms in the last week: Arm, Shoulder, or hand pain. 2 = Mild  Rate the severity of the following symptoms in the last week: Tingling (pins and needles) in your arm, shoulder or hand. 1 = none  During the past week, how much difficulty have you had sleeping because of the pain in your arm, shoulder or hand?  2 = Mild difficulty   (A QuickDASH score may not be calculated if there is greater than 1 missing item.)  Quick Dash Disability/Symptom Score: 9.1%  Minimally Clinically Important Difference (MCID): 15-20 points  Flavio, F. et al. (2013). Minimally clinically important difference of the disabilities of the arm, shoulder, and hand outcome measures (DASH) and its shortened version (Quick DASH). Journal of Orthopaedic & Sports Physical Therapy, 44(1), 30-39)   Cognition Patient is oriented to person, place, and time.  Recent memory is intact.  Remote memory is intact.  Attention span and concentration are intact.  Expressive speech is intact.  Patient's fund of knowledge is within normal limits for educational level.    Gross Musculoskeletal Assessment Tremor: None Bulk: Normal Tone: Normal  Gait   Posture   Cervical Screen AROM: WFL and painless with overpressure in all planes Spurlings A (ipsilateral lateral flexion/axial compression): R: Negative    AROM AROM  (Normal range in degrees) AROM  Cervical  Flexion (50)   Extension (80)   Right lateral flexion (45)   Left lateral flexion (45)   Right rotation (85)   Left rotation (85)    Right Left  Shoulder    Flexion 130 140  Extension    Abduction [facility]  WFL   External Rotation WFL* WFL   Internal Rotation    Hands Behind Head [facility] WFL  Hands Behind Back [facility] WFL      Elbow    Flexion    Extension    Pronation    Supination    (* = pain; Blank rows = not tested)  UE MMT: MMT (out of 5) Right Left   Cervical (isometric)  Flexion WNL  Extension WNL  Lateral Flexion WNL WNL  Rotation WNL WNL      Shoulder   Flexion 3+* 4+  Extension    Abduction 4-* 4+  External rotation 4-* 5  Internal rotation 5 5  Horizontal abduction    Horizontal adduction    Lower Trapezius    Rhomboids        Elbow  Flexion 5 5  Extension 5 5  Pronation    Supination        Wrist  Flexion    Extension    Radial deviation    Ulnar deviation        MCP  Flexion    Extension    Abduction    Adduction    (* = pain; Blank rows = not tested)  Sensation Grossly intact to light touch bilateral UE as determined by testing dermatomes C2-T2. Proprioception and hot/cold testing deferred on this date.  Reflexes Deferred   Palpation Location LEFT  RIGHT           Subocciptials    Cervical paraspinals    Upper Trapezius  0  Levator Scapulae  0  Rhomboid Major/Minor    Sternoclavicular joint    Acromioclavicular joint    Coracoid process    Long head of biceps    Supraspinatus  0  Infraspinatus  0  Subscapularis  0  Teres Minor    Teres Major    Pectoralis Major    Pectoralis Minor    Anterior Deltoid    Lateral Deltoid    Posterior Deltoid    Latissimus Dorsi    Sternocleidomastoid    (Blank rows = not tested) Graded on 0-4 scale (0 = no pain, 1 = pain, 2 = pain with wincing/grimacing/flinching, 3 = pain with withdrawal, 4 = unwilling to allow palpation), (Blank rows = not  tested)   Passive Accessory Intervertebral Motion Deferred  SPECIAL TESTS Rotator Cuff  Drop Arm Test: Negative Painful Arc (Pain from 60 to 120 degrees scaption): Positive Infraspinatus Muscle Test: Positive If all 3 tests positive, the probability of a full-thickness rotator cuff tear is 91%  TODAY'S TREATMENT  DATE: 09/15/24  Subjective:  Patient reports minor improvements pain in the R shoulder. Currently 2/10 pain prior to start of PT session. No questions or concerns.   Therapeutic Activity (focused on improving shoulder strength in order to optimize raking, lifting and yard work such as sweeping):  UBE - 5 min - 2.5 min Fwd, 2.5 min Retro - Level 16-12 for UE  strength and muscular endurance; PT manually adjusted resistance throughout to patient's tolerance.   HR Post: 137 bpm  Standing Shoulder ER (neutral elbow)   3 x 10 - Red TB    Waiter's Carry - Dumbbell    2 x 30' - 7# DB    Body Blade for time at 90 deg shoulder flexion    3 x 30s   Therapeutic Exercise:  Seated Scapular Row    1 x 10 - 15#   1 x 10 - 20#     1 x 10 - 25#       Reverse Lat Pull for biceps    1 x 10 - 25#   2 x 10 - 20#    Standing Shoulder Dumbbell Press    1 x 10 - 7# DB   2 x 10 - 5# DB    Door way Stretch in order to decrease rounded shoulders   3 x 15s   R Sleeper Stretch for rotator cuff stretch/posterior capsule   3 x 15s   PATIENT EDUCATION:  Education details: Exercise Technique  Person educated: Patient Education method: Explanation, Demonstration, and Handouts Education comprehension: verbalized understanding and returned demonstration   HOME EXERCISE PROGRAM:  Access Code: [email or web address] URL: [email or web address] Date: 09/01/2024 Prepared by: Lonni Pall  Exercises - Standing Isometric Shoulder External Rotation with Doorway  - 1 x daily - 7 x weekly - 3-5 sets - 10s hold - Seated Scapular Retraction  - 1-2 x daily - 3-4 x weekly - 2-3 sets -  10-12 reps - Shoulder External Rotation and Scapular Retraction with Resistance  - 1-2 x daily - 3-4 x weekly - 2-3 sets - 10-12 reps  ASSESSMENT:  CLINICAL IMPRESSION: Patient returned to OPPT in management of R shoulder pain secondary to RC weakness.Continued focus on open chain stability and UE strengthening. Pt tolerated increase in resistance with  cable exercises. Pain continues to have steady improvements with gradual rotator cuff strengthening. Updated HEP to include additional theraband exercises. PT plans to continue progressively strengthening RC muscles as tolerated. Based on today's performance, pt will continue to benefit from skilled PT in order to facilitate return to PLOF and improve QoL.   OBJECTIVE IMPAIRMENTS: decreased activity tolerance, decreased ROM, decreased strength, and pain.   ACTIVITY LIMITATIONS: carrying, lifting, sleeping, and reach over head  PARTICIPATION LIMITATIONS: yard work  PERSONAL FACTORS: Age, Time since onset of injury/illness/exacerbation, and 3+ comorbidities: Hx of NSTEMI, HTN, CHF are also affecting patient's functional outcome.   REHAB POTENTIAL: Good  CLINICAL DECISION MAKING: Evolving/moderate complexity  EVALUATION COMPLEXITY: Moderate   GOALS: Goals reviewed with patient? Yes  SHORT TERM GOALS: Target date: 09/29/2024  Pt will be independent with HEP to improve strength and decrease shoulder pain to improve pain-free function at home and work. Baseline: 09/01/2024: Initial HEP given Goal status: INITIAL   LONG TERM GOALS: Target date: 10/27/2024  Pt will increase R shoulder flexion to 140 degrees without pain in order to demonstrate limb symmetry and significantly improvements in ROM/pain for overhead ADLs. Baseline: R: 130 deg Goal status: INITIAL  2.  Pt will decrease worst shoulder pain by at least 3 points on the NPRS in order to demonstrate clinically significant reduction in shoulder pain. Baseline: 09/01/2024: 7/10  NPS Goal status: INITIAL  3.  Pt will decrease quick DASH score by at least 8% in order to demonstrate clinically significant reduction in disability related to shoulder pain        Baseline: 09/01/2024: 9.1%/100 (Minimal Disability) Goal status: INITIAL  4. Pt will increase strength to a 4+ MMT grade with R shoulder flexion, adb, and external rotation in order to demonstrate improvement in strength and function         Baseline: 09/01/2024:  Shoulder  R/L   Flexion 3+* 4+  Extension    Abduction 4-* 4+  External rotation 4-* 5   Goal status: INITIAL  5.  Pt will achieve 6-8 hours of sleep per night without report of awakening from pain in order to demonstrate significant improvements in pain.  Baseline: 09/01/2024: Patient wakes multiple times per night due to pain.  Goal status: INITIAL    PLAN: PT FREQUENCY: 1-2x/week  PT DURATION: 8 weeks  PLANNED INTERVENTIONS: Therapeutic exercises, Therapeutic activity, Neuromuscular re-education, Balance training, Gait training, Patient/Family education, Self Care, Joint mobilization, Joint manipulation, Vestibular training, Canalith repositioning, Orthotic/Fit training, DME instructions, Dry Needling, Electrical stimulation, Spinal manipulation, Spinal mobilization, Cryotherapy, Moist heat, Taping, Traction, Ultrasound, Ionotophoresis 4mg /ml Dexamethasone, Manual therapy, and Re-evaluation.  PLAN FOR NEXT SESSION: Review HEP, Progress RC strengthening, external rotation focus, Isometrics, review goals   Lonni Pall PT, DPT Physical Therapist- [place]  09/15/2024, 1:44 PM

## 2024-09-17 ENCOUNTER — Ambulatory Visit

## 2024-09-21 ENCOUNTER — Ambulatory Visit

## 2024-09-21 DIAGNOSIS — G8929 Other chronic pain: Secondary | ICD-10-CM

## 2024-09-21 DIAGNOSIS — M6281 Muscle weakness (generalized): Secondary | ICD-10-CM

## 2024-09-21 DIAGNOSIS — M25511 Pain in right shoulder: Secondary | ICD-10-CM | POA: Diagnosis not present

## 2024-09-21 NOTE — Therapy (Signed)
 OUTPATIENT PHYSICAL THERAPY SHOULDER/ELBOW TREATMENT  Patient Name: Xavier Davis MRN: [id number] DOB:June 16, 1941, 83 y.o., male Today's Date: 09/21/2024  END OF SESSION:  PT End of Session - 09/21/24 0952     Visit Number 6    Number of Visits 17    Date for Recertification  10/27/24    PT Start Time 0947    PT Stop Time 1025    PT Time Calculation (min) 38 min    Activity Tolerance Patient tolerated treatment well    Behavior During Therapy [facility] for tasks assessed/performed             Past Medical History:  Diagnosis Date   Anxiety    Atrial fibrillation (HCC)    BPH (benign prostatic hyperplasia)    Carotid artery disease    s/p left carotid endarterectomy at [facility] in 2014   Carotid stenosis    bilateral   CKD (chronic kidney disease)    Elevated PSA    Fracture, thoracic vertebra (HCC) 05/06/2018   T3,T9 endplate fracture   Hemothorax on left 05/06/2018   HLD (hyperlipidemia)    HTN (hypertension)    Hyperlipidemia    Hypertension    Insomnia    Rib fractures 05/06/2018   Weak urinary stream    Past Surgical History:  Procedure Laterality Date   APPENDECTOMY     CAROTID PTA/STENT INTERVENTION Right 02/26/2022   Procedure: CAROTID PTA/STENT INTERVENTION;  Surgeon: Marea Selinda RAMAN, MD;  Location: [facility] INVASIVE CV LAB;  Service: Cardiovascular;  Laterality: Right;   carotid stenosis Left    ces and stent placement   cataract surgery Bilateral    Patient Active Problem List   Diagnosis Date Noted   Congestive heart failure (HCC) 06/29/2022   Encounter for therapeutic drug level monitoring 06/29/2022   Erectile dysfunction 06/29/2022   History of anemia 06/29/2022   Hypertensive heart and chronic kidney disease with heart failure and stage 1 through stage 4 chronic kidney disease, or unspecified chronic kidney disease (HCC) 06/29/2022   Carotid artery stenosis 06/29/2022   Chronic kidney disease 06/29/2022   Atrial fibrillation (HCC) 06/29/2022    Unspecified atrial fibrillation (HCC) 06/29/2022   Bilateral leg edema 05/03/2022   Bradycardia 05/03/2022   Acute on chronic diastolic CHF (congestive heart failure) (HCC) 03/01/2022   NSTEMI (non-ST elevated myocardial infarction) (HCC) 02/28/2022   Carotid stenosis, right 02/26/2022   Abnormal ECG 01/15/2022   Combined forms of age-related cataract, bilateral 12/26/2021   Benign neoplasm of colon 01/13/2020   Elevated prostate specific antigen (PSA) 01/13/2020   Insomnia 01/13/2020   Other ill-defined and unknown causes of morbidity and mortality 01/13/2020   Psychosexual dysfunction with inhibited sexual excitement 01/13/2020   Status post vasectomy 01/13/2020   DDD (degenerative disc disease), lumbar 07/30/2018   Anxiety 07/28/2018   Carotid stenosis 07/28/2018   CKD (chronic kidney disease) stage 3, GFR 30-59 ml/min 07/28/2018   Elevated blood sugar 07/28/2018   Occlusion and stenosis of carotid artery 07/28/2018   Paroxysmal atrial fibrillation (HCC) 07/03/2018   Leg swelling 07/03/2018   Paroxysmal A-fib (HCC) 07/03/2018   Fall from ladder 04/16/2018   Bilateral carotid artery stenosis 10/23/2016   Hypertension 10/23/2016   Hyperlipidemia 10/23/2016   Incomplete bladder emptying 07/02/2015   Benign prostatic hyperplasia 06/23/2015    PCP:  Sadie Manna, MD      REFERRING PROVIDER:  Kathlynn Sharper, MD      REFERRING DIAG:  S46.011A (ICD-10-CM) - Strain of muscle(s) and tendon(s) of  the rotator cuff of right shoulder, initial encounter    RATIONALE FOR EVALUATION AND TREATMENT: Rehabilitation  THERAPY DIAG: Chronic right shoulder pain  Muscle weakness (generalized)  ONSET DATE: 6 mos  FOLLOW-UP APPT SCHEDULED WITH REFERRING PROVIDER: No    SUBJECTIVE:                                                                                                                                                                                         SUBJECTIVE  STATEMENT:    Patient reports to OPPT with R shoulder pain.   PERTINENT HISTORY:   Xavier Davis is a 83 y.o. male presenting with R shoulder pain. 6 mos ago patient reports he was pulling on a tree limb and he felt a sudden shock in his R arm. Patient reports that the pain disrupts his sleep cycle throughout the night. Additionally he reports that it painful to lift his arm past 90 degrees. Patient tried ice initially with minimal relief. Patient still continues to use RUE to perform ADLs around the house although pain is present   Patient denies chills/fever, night sweats, nausea  Hand Dominance: R Hand   Imaging: (Per Chart Review 08/24/24):  Dr. Ozell Fairy Flake: X-rays conducted on August 12, 2024, revealed moderate AC degenerative changes, joint space narrowing, and some inferior spurring of the glenohumeral joint, as well as AC arthritis.   PAIN:  Pain Intensity: Present: 1/10, Best: 0/10, Worst: 6-7/10 Pain location: R Shoulder  Pain Quality: constant and aching  Radiating: No  Numbness/Tingling: No History of prior shoulder or neck/shoulder injury, pain, surgery, or therapy: No  PRECAUTIONS: None  WEIGHT BEARING RESTRICTIONS: No  FALLS: Has patient fallen in last 6 months? No  Living Environment Lives with: lives with their spouse Lives in: House/apartment Stairs: No Has following equipment at home: None  Prior level of function: Independent  Occupational demands: Retired  Hobbies: Outdoors, Gardening  Patient Goals: Be able to lift my arm without pain and reduce the aching at night.     OBJECTIVE:   Patient Surveys  QUICK DASH  Please rate your ability do the following activities in the last week by selecting the number below the appropriate response.   Activities Rating  Open a tight or new jar.  1 = No difficulty   Do heavy household chores (e.g., wash walls, floors). 1 = No difficulty   Carry a shopping bag or briefcase 1 = No difficulty    Wash your back. 1 = No difficulty   Use a knife to cut food. 1 = No difficulty   Recreational activities in which you take some force  or impact through your arm, shoulder or hand (e.g., golf, hammering, tennis, etc.). 2 = Mild difficulty  During the past week, to what extent has your arm, shoulder or hand problem interfered with your normal social activities with family, friends, neighbors or groups?  1 = Not at all  During the past week, were you limited in your work or other regular daily activities as a result of your arm, shoulder or hand problem? 2 = Slightly limited  Rate the severity of the following symptoms in the last week: Arm, Shoulder, or hand pain. 2 = Mild  Rate the severity of the following symptoms in the last week: Tingling (pins and needles) in your arm, shoulder or hand. 1 = none  During the past week, how much difficulty have you had sleeping because of the pain in your arm, shoulder or hand?  2 = Mild difficulty   (A QuickDASH score may not be calculated if there is greater than 1 missing item.)  Quick Dash Disability/Symptom Score: 9.1%  Minimally Clinically Important Difference (MCID): 15-20 points  Flavio, F. et al. (2013). Minimally clinically important difference of the disabilities of the arm, shoulder, and hand outcome measures (DASH) and its shortened version (Quick DASH). Journal of Orthopaedic & Sports Physical Therapy, 44(1), 30-39)   Cognition Patient is oriented to person, place, and time.  Recent memory is intact.  Remote memory is intact.  Attention span and concentration are intact.  Expressive speech is intact.  Patient's fund of knowledge is within normal limits for educational level.    Gross Musculoskeletal Assessment Tremor: None Bulk: Normal Tone: Normal  Gait   Posture   Cervical Screen AROM: WFL and painless with overpressure in all planes Spurlings A (ipsilateral lateral flexion/axial compression): R: Negative     AROM AROM (Normal range in degrees) AROM  Cervical  Flexion (50)   Extension (80)   Right lateral flexion (45)   Left lateral flexion (45)   Right rotation (85)   Left rotation (85)    Right Left  Shoulder    Flexion 130 140  Extension    Abduction [facility]  WFL   External Rotation WFL* WFL   Internal Rotation    Hands Behind Head [facility] WFL  Hands Behind Back [facility] WFL      Elbow    Flexion    Extension    Pronation    Supination    (* = pain; Blank rows = not tested)  UE MMT: MMT (out of 5) Right Left   Cervical (isometric)  Flexion WNL  Extension WNL  Lateral Flexion WNL WNL  Rotation WNL WNL      Shoulder   Flexion 3+* 4+  Extension    Abduction 4-* 4+  External rotation 4-* 5  Internal rotation 5 5  Horizontal abduction    Horizontal adduction    Lower Trapezius    Rhomboids        Elbow  Flexion 5 5  Extension 5 5  Pronation    Supination        Wrist  Flexion    Extension    Radial deviation    Ulnar deviation        MCP  Flexion    Extension    Abduction    Adduction    (* = pain; Blank rows = not tested)  Sensation Grossly intact to light touch bilateral UE as determined by testing dermatomes C2-T2. Proprioception and hot/cold testing deferred on this date.  Reflexes Deferred   Palpation Location LEFT  RIGHT           Subocciptials    Cervical paraspinals    Upper Trapezius  0  Levator Scapulae  0  Rhomboid Major/Minor    Sternoclavicular joint    Acromioclavicular joint    Coracoid process    Long head of biceps    Supraspinatus  0  Infraspinatus  0  Subscapularis  0  Teres Minor    Teres Major    Pectoralis Major    Pectoralis Minor    Anterior Deltoid    Lateral Deltoid    Posterior Deltoid    Latissimus Dorsi    Sternocleidomastoid    (Blank rows = not tested) Graded on 0-4 scale (0 = no pain, 1 = pain, 2 = pain with wincing/grimacing/flinching, 3 = pain with withdrawal, 4 = unwilling to allow palpation), (Blank  rows = not tested)   Passive Accessory Intervertebral Motion Deferred  SPECIAL TESTS Rotator Cuff  Drop Arm Test: Negative Painful Arc (Pain from 60 to 120 degrees scaption): Positive Infraspinatus Muscle Test: Positive If all 3 tests positive, the probability of a full-thickness rotator cuff tear is 91%  TODAY'S TREATMENT  DATE: 09/21/24  Subjective:  Patient reports no pain at the start of the session. He only notices pain in the R shoulder with very specific OH motions. No questions or concerns.   Therapeutic Activity (focused on improving shoulder strength in order to optimize raking, lifting and yard work such as sweeping):  UBE - 5 min - 2.5 min Fwd, 2.5 min Retro - Level 16-12 for UE  strength and muscular endurance; PT manually adjusted resistance throughout to patient's tolerance.    Horizontal Wall Walk against resistance  2 x 6' - YTB   4  x6' - RTB  Waiters Carry with DB  2 x 20' - 7#   Therapeutic Exercise:  Seated Scapular Row    1 x 10 - 15#    2 x 10 - 20#      Reverse Lat Pull for biceps    2 x 10 - 20# 1 x 10  - 25#   Standing TB Pull Apart 2 x 10 - GTB   Sidelying External Rotation against DB   3 x 10 - 3# DB    Sidelying Horizontal Abduction against resistance    1 x 10 - AROM    3 x 10 - 2# DB     PATIENT EDUCATION:  Education details: Exercise Technique  Person educated: Patient Education method: Explanation, Demonstration, and Handouts Education comprehension: verbalized understanding and returned demonstration   HOME EXERCISE PROGRAM:  Access Code: [email or web address] URL: [email or web address] Date: 09/01/2024 Prepared by: Lonni Pall  Exercises - Standing Isometric Shoulder External Rotation with Doorway  - 1 x daily - 7 x weekly - 3-5 sets - 10s hold - Seated Scapular Retraction  - 1-2 x daily - 3-4 x weekly - 2-3 sets - 10-12 reps - Shoulder External Rotation and Scapular Retraction with Resistance  - 1-2 x daily - 3-4  x weekly - 2-3 sets - 10-12 reps  ASSESSMENT:  CLINICAL IMPRESSION: Patient returned to OPPT in management of R shoulder pain secondary to RC weakness. PT tolerated progressive loading to posterior deltoid and rotator cuff muscles. Patient tolerating R external rotation and horizontal abduction against resistance without pain. He continues to demonstrate steady improvements towards UE strength and tolerating OH motions with minimal pain. PT continues to plan strengthening  rotator cuff in open and closed chain positions. Based on today's performance, pt will continue to benefit from skilled PT in order to facilitate return to PLOF and improve QoL.   OBJECTIVE IMPAIRMENTS: decreased activity tolerance, decreased ROM, decreased strength, and pain.   ACTIVITY LIMITATIONS: carrying, lifting, sleeping, and reach over head  PARTICIPATION LIMITATIONS: yard work  PERSONAL FACTORS: Age, Time since onset of injury/illness/exacerbation, and 3+ comorbidities: Hx of NSTEMI, HTN, CHF are also affecting patient's functional outcome.   REHAB POTENTIAL: Good  CLINICAL DECISION MAKING: Evolving/moderate complexity  EVALUATION COMPLEXITY: Moderate   GOALS: Goals reviewed with patient? Yes  SHORT TERM GOALS: Target date: 09/29/2024  Pt will be independent with HEP to improve strength and decrease shoulder pain to improve pain-free function at home and work. Baseline: 09/01/2024: Initial HEP given Goal status: INITIAL   LONG TERM GOALS: Target date: 10/27/2024  Pt will increase R shoulder flexion to 140 degrees without pain in order to demonstrate limb symmetry and significantly improvements in ROM/pain for overhead ADLs. Baseline: R: 130 deg Goal status: INITIAL  2.  Pt will decrease worst shoulder pain by at least 3 points on the NPRS in order to demonstrate clinically significant reduction in shoulder pain. Baseline: 09/01/2024: 7/10 NPS Goal status: INITIAL  3.  Pt will decrease quick DASH  score by at least 8% in order to demonstrate clinically significant reduction in disability related to shoulder pain        Baseline: 09/01/2024: 9.1%/100 (Minimal Disability) Goal status: INITIAL  4. Pt will increase strength to a 4+ MMT grade with R shoulder flexion, adb, and external rotation in order to demonstrate improvement in strength and function         Baseline: 09/01/2024:  Shoulder  R/L   Flexion 3+* 4+  Extension    Abduction 4-* 4+  External rotation 4-* 5   Goal status: INITIAL  5.  Pt will achieve 6-8 hours of sleep per night without report of awakening from pain in order to demonstrate significant improvements in pain.  Baseline: 09/01/2024: Patient wakes multiple times per night due to pain.  Goal status: INITIAL    PLAN: PT FREQUENCY: 1-2x/week  PT DURATION: 8 weeks  PLANNED INTERVENTIONS: Therapeutic exercises, Therapeutic activity, Neuromuscular re-education, Balance training, Gait training, Patient/Family education, Self Care, Joint mobilization, Joint manipulation, Vestibular training, Canalith repositioning, Orthotic/Fit training, DME instructions, Dry Needling, Electrical stimulation, Spinal manipulation, Spinal mobilization, Cryotherapy, Moist heat, Taping, Traction, Ultrasound, Ionotophoresis 4mg /ml Dexamethasone, Manual therapy, and Re-evaluation.  PLAN FOR NEXT SESSION: Review HEP, Progress RC strengthening, external rotation focus, Isometrics, review goals   Lonni Pall PT, DPT Physical Therapist- [place]  09/21/2024, 10:25 AM

## 2024-09-23 ENCOUNTER — Ambulatory Visit

## 2024-09-23 DIAGNOSIS — M6281 Muscle weakness (generalized): Secondary | ICD-10-CM

## 2024-09-23 DIAGNOSIS — G8929 Other chronic pain: Secondary | ICD-10-CM

## 2024-09-23 DIAGNOSIS — M25511 Pain in right shoulder: Secondary | ICD-10-CM | POA: Diagnosis not present

## 2024-09-23 NOTE — Therapy (Signed)
 OUTPATIENT PHYSICAL THERAPY SHOULDER/ELBOW TREATMENT  Patient Name: Xavier Davis MRN: [id number] DOB:1941/07/22, 83 y.o., male Today's Date: 09/23/2024  END OF SESSION:  PT End of Session - 09/23/24 0901     Visit Number 7    Number of Visits 17    Date for Recertification  10/27/24    PT Start Time 0900    PT Stop Time 0940    PT Time Calculation (min) 40 min    Activity Tolerance Patient tolerated treatment well    Behavior During Therapy [facility] for tasks assessed/performed             Past Medical History:  Diagnosis Date   Anxiety    Atrial fibrillation (HCC)    BPH (benign prostatic hyperplasia)    Carotid artery disease    s/p left carotid endarterectomy at [facility] in 2014   Carotid stenosis    bilateral   CKD (chronic kidney disease)    Elevated PSA    Fracture, thoracic vertebra (HCC) 05/06/2018   T3,T9 endplate fracture   Hemothorax on left 05/06/2018   HLD (hyperlipidemia)    HTN (hypertension)    Hyperlipidemia    Hypertension    Insomnia    Rib fractures 05/06/2018   Weak urinary stream    Past Surgical History:  Procedure Laterality Date   APPENDECTOMY     CAROTID PTA/STENT INTERVENTION Right 02/26/2022   Procedure: CAROTID PTA/STENT INTERVENTION;  Surgeon: Marea Selinda RAMAN, MD;  Location: [facility] INVASIVE CV LAB;  Service: Cardiovascular;  Laterality: Right;   carotid stenosis Left    ces and stent placement   cataract surgery Bilateral    Patient Active Problem List   Diagnosis Date Noted   Congestive heart failure (HCC) 06/29/2022   Encounter for therapeutic drug level monitoring 06/29/2022   Erectile dysfunction 06/29/2022   History of anemia 06/29/2022   Hypertensive heart and chronic kidney disease with heart failure and stage 1 through stage 4 chronic kidney disease, or unspecified chronic kidney disease (HCC) 06/29/2022   Carotid artery stenosis 06/29/2022   Chronic kidney disease 06/29/2022   Atrial fibrillation (HCC) 06/29/2022    Unspecified atrial fibrillation (HCC) 06/29/2022   Bilateral leg edema 05/03/2022   Bradycardia 05/03/2022   Acute on chronic diastolic CHF (congestive heart failure) (HCC) 03/01/2022   NSTEMI (non-ST elevated myocardial infarction) (HCC) 02/28/2022   Carotid stenosis, right 02/26/2022   Abnormal ECG 01/15/2022   Combined forms of age-related cataract, bilateral 12/26/2021   Benign neoplasm of colon 01/13/2020   Elevated prostate specific antigen (PSA) 01/13/2020   Insomnia 01/13/2020   Other ill-defined and unknown causes of morbidity and mortality 01/13/2020   Psychosexual dysfunction with inhibited sexual excitement 01/13/2020   Status post vasectomy 01/13/2020   DDD (degenerative disc disease), lumbar 07/30/2018   Anxiety 07/28/2018   Carotid stenosis 07/28/2018   CKD (chronic kidney disease) stage 3, GFR 30-59 ml/min 07/28/2018   Elevated blood sugar 07/28/2018   Occlusion and stenosis of carotid artery 07/28/2018   Paroxysmal atrial fibrillation (HCC) 07/03/2018   Leg swelling 07/03/2018   Paroxysmal A-fib (HCC) 07/03/2018   Fall from ladder 04/16/2018   Bilateral carotid artery stenosis 10/23/2016   Hypertension 10/23/2016   Hyperlipidemia 10/23/2016   Incomplete bladder emptying 07/02/2015   Benign prostatic hyperplasia 06/23/2015    PCP:  Sadie Manna, MD      REFERRING PROVIDER:  Kathlynn Sharper, MD      REFERRING DIAG:  S46.011A (ICD-10-CM) - Strain of muscle(s) and tendon(s) of  the rotator cuff of right shoulder, initial encounter    RATIONALE FOR EVALUATION AND TREATMENT: Rehabilitation  THERAPY DIAG: Chronic right shoulder pain  Muscle weakness (generalized)  ONSET DATE: 6 mos  FOLLOW-UP APPT SCHEDULED WITH REFERRING PROVIDER: No    SUBJECTIVE:                                                                                                                                                                                         SUBJECTIVE  STATEMENT:    Patient reports to OPPT with R shoulder pain.   PERTINENT HISTORY:   Xavier Davis is a 83 y.o. male presenting with R shoulder pain. 6 mos ago patient reports he was pulling on a tree limb and he felt a sudden shock in his R arm. Patient reports that the pain disrupts his sleep cycle throughout the night. Additionally he reports that it painful to lift his arm past 90 degrees. Patient tried ice initially with minimal relief. Patient still continues to use RUE to perform ADLs around the house although pain is present   Patient denies chills/fever, night sweats, nausea  Hand Dominance: R Hand   Imaging: (Per Chart Review 08/24/24):  Dr. Ozell Fairy [place]ake: X-rays conducted on August 12, 2024, revealed moderate AC degenerative changes, joint space narrowing, and some inferior spurring of the glenohumeral joint, as well as AC arthritis.   PAIN:  Pain Intensity: Present: 1/10, Best: 0/10, Worst: 6-7/10 Pain location: R Shoulder  Pain Quality: constant and aching  Radiating: No  Numbness/Tingling: No History of prior shoulder or neck/shoulder injury, pain, surgery, or therapy: No  PRECAUTIONS: None  WEIGHT BEARING RESTRICTIONS: No  FALLS: Has patient fallen in last 6 months? No  Living Environment Lives with: lives with their spouse Lives in: House/apartment Stairs: No Has following equipment at home: None  Prior level of function: Independent  Occupational demands: Retired  Hobbies: Outdoors, Gardening  Patient Goals: Be able to lift my arm without pain and reduce the aching at night.     OBJECTIVE:   Patient Surveys  QUICK DASH  Please rate your ability do the following activities in the last week by selecting the number below the appropriate response.   Activities Rating  Open a tight or new jar.  1 = No difficulty   Do heavy household chores (e.g., wash walls, floors). 1 = No difficulty   Carry a shopping bag or briefcase 1 = No difficulty    Wash your back. 1 = No difficulty   Use a knife to cut food. 1 = No difficulty   Recreational activities in which you take some force  or impact through your arm, shoulder or hand (e.g., golf, hammering, tennis, etc.). 2 = Mild difficulty  During the past week, to what extent has your arm, shoulder or hand problem interfered with your normal social activities with family, friends, neighbors or groups?  1 = Not at all  During the past week, were you limited in your work or other regular daily activities as a result of your arm, shoulder or hand problem? 2 = Slightly limited  Rate the severity of the following symptoms in the last week: Arm, Shoulder, or hand pain. 2 = Mild  Rate the severity of the following symptoms in the last week: Tingling (pins and needles) in your arm, shoulder or hand. 1 = none  During the past week, how much difficulty have you had sleeping because of the pain in your arm, shoulder or hand?  2 = Mild difficulty   (A QuickDASH score may not be calculated if there is greater than 1 missing item.)  Quick Dash Disability/Symptom Score: 9.1%  Minimally Clinically Important Difference (MCID): 15-20 points  Flavio, F. et al. (2013). Minimally clinically important difference of the disabilities of the arm, shoulder, and hand outcome measures (DASH) and its shortened version (Quick DASH). Journal of Orthopaedic & Sports Physical Therapy, 44(1), 30-39)   Cognition Patient is oriented to person, place, and time.  Recent memory is intact.  Remote memory is intact.  Attention span and concentration are intact.  Expressive speech is intact.  Patient's fund of knowledge is within normal limits for educational level.    Gross Musculoskeletal Assessment Tremor: None Bulk: Normal Tone: Normal  Gait   Posture   Cervical Screen AROM: WFL and painless with overpressure in all planes Spurlings A (ipsilateral lateral flexion/axial compression): R: Negative     AROM AROM (Normal range in degrees) AROM  Cervical  Flexion (50)   Extension (80)   Right lateral flexion (45)   Left lateral flexion (45)   Right rotation (85)   Left rotation (85)    Right Left  Shoulder    Flexion 130 140  Extension    Abduction [facility]  WFL   External Rotation WFL* WFL   Internal Rotation    Hands Behind Head [facility] WFL  Hands Behind Back [facility] WFL      Elbow    Flexion    Extension    Pronation    Supination    (* = pain; Blank rows = not tested)  UE MMT: MMT (out of 5) Right Left   Cervical (isometric)  Flexion WNL  Extension WNL  Lateral Flexion WNL WNL  Rotation WNL WNL      Shoulder   Flexion 3+* 4+  Extension    Abduction 4-* 4+  External rotation 4-* 5  Internal rotation 5 5  Horizontal abduction    Horizontal adduction    Lower Trapezius    Rhomboids        Elbow  Flexion 5 5  Extension 5 5  Pronation    Supination        Wrist  Flexion    Extension    Radial deviation    Ulnar deviation        MCP  Flexion    Extension    Abduction    Adduction    (* = pain; Blank rows = not tested)  Sensation Grossly intact to light touch bilateral UE as determined by testing dermatomes C2-T2. Proprioception and hot/cold testing deferred on this date.  Reflexes Deferred   Palpation Location LEFT  RIGHT           Subocciptials    Cervical paraspinals    Upper Trapezius  0  Levator Scapulae  0  Rhomboid Major/Minor    Sternoclavicular joint    Acromioclavicular joint    Coracoid process    Long head of biceps    Supraspinatus  0  Infraspinatus  0  Subscapularis  0  Teres Minor    Teres Major    Pectoralis Major    Pectoralis Minor    Anterior Deltoid    Lateral Deltoid    Posterior Deltoid    Latissimus Dorsi    Sternocleidomastoid    (Blank rows = not tested) Graded on 0-4 scale (0 = no pain, 1 = pain, 2 = pain with wincing/grimacing/flinching, 3 = pain with withdrawal, 4 = unwilling to allow palpation), (Blank  rows = not tested)   Passive Accessory Intervertebral Motion Deferred  SPECIAL TESTS Rotator Cuff  Drop Arm Test: Negative Painful Arc (Pain from 60 to 120 degrees scaption): Positive Infraspinatus Muscle Test: Positive If all 3 tests positive, the probability of a full-thickness rotator cuff tear is 91%  TODAY'S TREATMENT  DATE: 09/23/24  Subjective:  Patient reports no pain in R shoulder at rest, persistent pain with OH reaching. Describes the pain as an ache 3/10 NPS. No further questions or concerns.   Therapeutic Activity (focused on improving shoulder strength in order to optimize raking, lifting and yard work such as sweeping):  UBE - 5 min - 2.5 min Fwd, 2.5 min Retro - Level 14-12 for UE  strength and muscular endurance; PT manually adjusted resistance throughout to patient's tolerance.    Standing Over head press  2 x 10 - 5# DB, minor pain in the lower back   1 x 10 - 3# DB   Standing Bicep Curl   3 x 10 - 6# DB    Standing Shoulder Fly (0-60 deg abd)    3 x 10 - 4# DB    - minor shrug sign in R shoulder     Standing Posterior Shoulder Stretch - UE anchored on door frame   30s/bout x 3 in order to improve tissue extensibility   Doorway Pec Stretch    30s/bout x 3 in order to improve tissue extensibility   Therapeutic Exercise:  Seated Scapular Row    3 x 10 - 20#     Seated Lat Pull Down    3 x 10 - 25#    Seated R Shoulder ER - Elbow supported on R knee    3 x 10 - 4#    Sidelying Reverse Shoulder Fly for deltoid and rotator cuff    3 x 10 - 3# DB     PATIENT EDUCATION:  Education details: Exercise Technique  Person educated: Patient Education method: Explanation, Demonstration, and Handouts Education comprehension: verbalized understanding and returned demonstration   HOME EXERCISE PROGRAM:  Access Code: [email or web address] URL: [email or web address] Date: 09/23/2024 Prepared by: Lonni Pall  Exercises - Seated Scapular Retraction   - 1-2 x daily - 3-4 x weekly - 2-3 sets - 10-12 reps - Sleeper Stretch  - 1 x daily - 7 x weekly - 3 sets - 30s hold - Doorway Pec Stretch at 90 Degrees Abduction  - 1 x daily - 7 x weekly - 3 sets - 30s hold - Standing Shoulder Posterior Capsule Stretch  - 1 x daily - 7 x weekly -  3 sets - 30s hold - Standing Isometric Shoulder External Rotation with Doorway  - 1 x daily - 7 x weekly - 3-5 sets - 10s hold - Standing Shoulder Row with Anchored Resistance  - 1 x daily - 3-4 x weekly - 2-3 sets - 10-12 reps - Shoulder External Rotation with Anchored Resistance with Towel Under Elbow  - 1 x daily - 3-4 x weekly - 2-3 sets - 10-12 reps - Shoulder External Rotation and Scapular Retraction with Resistance  - 1-2 x daily - 3-4 x weekly - 2-3 sets - 10-12 reps - Shoulder Internal Rotation with Resistance  - 1 x daily - 3-4 x weekly - 2-3 sets - 10-12 reps - Sidelying Shoulder Horizontal Abduction  - 1 x daily - 3-4 x weekly - 2-3 sets - 10-12 reps  Access Code: [email or web address] URL: [email or web address] Date: 09/01/2024 Prepared by: Lonni Pall  Exercises - Standing Isometric Shoulder External Rotation with Doorway  - 1 x daily - 7 x weekly - 3-5 sets - 10s hold - Seated Scapular Retraction  - 1-2 x daily - 3-4 x weekly - 2-3 sets - 10-12 reps - Shoulder External Rotation and Scapular Retraction with Resistance  - 1-2 x daily - 3-4 x weekly - 2-3 sets - 10-12 reps  ASSESSMENT:  CLINICAL IMPRESSION: Patient returned to OPPT in management of R shoulder pain secondary to RC weakness. Continued progressive overloading of shoulder musculature and rotator cuff muscles in RUE. Patient tolerated all interventions without exacerbation of pain in the R shoulder. Patient still presenting with minor shrug sign in R shoulder with abduction <60 degs. PT continues to plan strengthening rotator cuff in open and closed chain positions. Based on today's performance, pt will continue to benefit from skilled  PT in order to facilitate return to PLOF and improve QoL.   OBJECTIVE IMPAIRMENTS: decreased activity tolerance, decreased ROM, decreased strength, and pain.   ACTIVITY LIMITATIONS: carrying, lifting, sleeping, and reach over head  PARTICIPATION LIMITATIONS: yard work  PERSONAL FACTORS: Age, Time since onset of injury/illness/exacerbation, and 3+ comorbidities: Hx of NSTEMI, HTN, CHF are also affecting patient's functional outcome.   REHAB POTENTIAL: Good  CLINICAL DECISION MAKING: Evolving/moderate complexity  EVALUATION COMPLEXITY: Moderate   GOALS: Goals reviewed with patient? Yes  SHORT TERM GOALS: Target date: 09/29/2024  Pt will be independent with HEP to improve strength and decrease shoulder pain to improve pain-free function at home and work. Baseline: 09/01/2024: Initial HEP given Goal status: INITIAL   LONG TERM GOALS: Target date: 10/27/2024  Pt will increase R shoulder flexion to 140 degrees without pain in order to demonstrate limb symmetry and significantly improvements in ROM/pain for overhead ADLs. Baseline: R: 130 deg Goal status: INITIAL  2.  Pt will decrease worst shoulder pain by at least 3 points on the NPRS in order to demonstrate clinically significant reduction in shoulder pain. Baseline: 09/01/2024: 7/10 NPS Goal status: INITIAL  3.  Pt will decrease quick DASH score by at least 8% in order to demonstrate clinically significant reduction in disability related to shoulder pain        Baseline: 09/01/2024: 9.1%/100 (Minimal Disability) Goal status: INITIAL  4. Pt will increase strength to a 4+ MMT grade with R shoulder flexion, adb, and external rotation in order to demonstrate improvement in strength and function         Baseline: 09/01/2024:  Shoulder  R/L   Flexion 3+* 4+  Extension    Abduction 4-* 4+  External rotation  4-* 5   Goal status: INITIAL  5.  Pt will achieve 6-8 hours of sleep per night without report of awakening from pain in  order to demonstrate significant improvements in pain.  Baseline: 09/01/2024: Patient wakes multiple times per night due to pain.  Goal status: INITIAL    PLAN: PT FREQUENCY: 1-2x/week  PT DURATION: 8 weeks  PLANNED INTERVENTIONS: Therapeutic exercises, Therapeutic activity, Neuromuscular re-education, Balance training, Gait training, Patient/Family education, Self Care, Joint mobilization, Joint manipulation, Vestibular training, Canalith repositioning, Orthotic/Fit training, DME instructions, Dry Needling, Electrical stimulation, Spinal manipulation, Spinal mobilization, Cryotherapy, Moist heat, Taping, Traction, Ultrasound, Ionotophoresis 4mg /ml Dexamethasone, Manual therapy, and Re-evaluation.  PLAN FOR NEXT SESSION: Review HEP, Progress RC strengthening, external rotation focus, Isometrics, review goals   Lonni Pall PT, DPT Physical Therapist- [place]  09/23/2024, 9:01 AM

## 2024-09-29 ENCOUNTER — Ambulatory Visit

## 2024-09-29 ENCOUNTER — Telehealth: Payer: Self-pay

## 2024-09-29 NOTE — Telephone Encounter (Signed)
 Spoke with spouse Joya) regarding missed appointment. Advised spouse of next appointment; spouse with full understanding of following appointment and attendance policy. No questions or concerns at end of phone call.   Lonni Pall PT, DPT Physical Therapist- [place]

## 2024-10-05 ENCOUNTER — Ambulatory Visit

## 2024-10-05 DIAGNOSIS — M25511 Pain in right shoulder: Secondary | ICD-10-CM | POA: Diagnosis present

## 2024-10-05 DIAGNOSIS — M6281 Muscle weakness (generalized): Secondary | ICD-10-CM | POA: Insufficient documentation

## 2024-10-05 DIAGNOSIS — G8929 Other chronic pain: Secondary | ICD-10-CM | POA: Insufficient documentation

## 2024-10-05 NOTE — Therapy (Signed)
 OUTPATIENT PHYSICAL THERAPY SHOULDER/ELBOW TREATMENT  Patient Name: Xavier Davis MRN: [id number] DOB:[birth year], 83 y.o., male Today's Date: 10/05/2024  END OF SESSION:  PT End of Session - 10/05/24 1033     Visit Number 8    Number of Visits 17    Date for Recertification  10/27/24    PT Start Time 1032    PT Stop Time 1115    PT Time Calculation (min) 43 min    Activity Tolerance Patient tolerated treatment well    Behavior During Therapy [facility] for tasks assessed/performed             Past Medical History:  Diagnosis Date   Anxiety    Atrial fibrillation (HCC)    BPH (benign prostatic hyperplasia)    Carotid artery disease    s/p left carotid endarterectomy at [facility] in 2014   Carotid stenosis    bilateral   CKD (chronic kidney disease)    Elevated PSA    Fracture, thoracic vertebra (HCC) 05/06/2018   T3,T9 endplate fracture   Hemothorax on left 05/06/2018   HLD (hyperlipidemia)    HTN (hypertension)    Hyperlipidemia    Hypertension    Insomnia    Rib fractures 05/06/2018   Weak urinary stream    Past Surgical History:  Procedure Laterality Date   APPENDECTOMY     CAROTID PTA/STENT INTERVENTION Right 02/26/2022   Procedure: CAROTID PTA/STENT INTERVENTION;  Surgeon: Marea Selinda RAMAN, MD;  Location: [facility] INVASIVE CV LAB;  Service: Cardiovascular;  Laterality: Right;   carotid stenosis Left    ces and stent placement   cataract surgery Bilateral    Patient Active Problem List   Diagnosis Date Noted   Congestive heart failure (HCC) 06/29/2022   Encounter for therapeutic drug level monitoring 06/29/2022   Erectile dysfunction 06/29/2022   History of anemia 06/29/2022   Hypertensive heart and chronic kidney disease with heart failure and stage 1 through stage 4 chronic kidney disease, or unspecified chronic kidney disease (HCC) 06/29/2022   Carotid artery stenosis 06/29/2022   Chronic kidney disease 06/29/2022   Atrial fibrillation (HCC) 06/29/2022   Unspecified  atrial fibrillation (HCC) 06/29/2022   Bilateral leg edema 05/03/2022   Bradycardia 05/03/2022   Acute on chronic diastolic CHF (congestive heart failure) (HCC) 03/01/2022   NSTEMI (non-ST elevated myocardial infarction) (HCC) 02/28/2022   Carotid stenosis, right 02/26/2022   Abnormal ECG 01/15/2022   Combined forms of age-related cataract, bilateral 12/26/2021   Benign neoplasm of colon 01/13/2020   Elevated prostate specific antigen (PSA) 01/13/2020   Insomnia 01/13/2020   Other ill-defined and unknown causes of morbidity and mortality 01/13/2020   Psychosexual dysfunction with inhibited sexual excitement 01/13/2020   Status post vasectomy 01/13/2020   DDD (degenerative disc disease), lumbar 07/30/2018   Anxiety 07/28/2018   Carotid stenosis 07/28/2018   CKD (chronic kidney disease) stage 3, GFR 30-59 ml/min 07/28/2018   Elevated blood sugar 07/28/2018   Occlusion and stenosis of carotid artery 07/28/2018   Paroxysmal atrial fibrillation (HCC) 07/03/2018   Leg swelling 07/03/2018   Paroxysmal A-fib (HCC) 07/03/2018   Fall from ladder 04/16/2018   Bilateral carotid artery stenosis 10/23/2016   Hypertension 10/23/2016   Hyperlipidemia 10/23/2016   Incomplete bladder emptying 07/02/2015   Benign prostatic hyperplasia 06/23/2015    PCP:  Sadie Manna, MD      REFERRING PROVIDER:  Kathlynn Sharper, MD      REFERRING DIAG:  S46.011A (ICD-10-CM) - Strain of muscle(s) and tendon(s) of  the rotator cuff of right shoulder, initial encounter    RATIONALE FOR EVALUATION AND TREATMENT: Rehabilitation  THERAPY DIAG: Chronic right shoulder pain  Muscle weakness (generalized)  ONSET DATE: 6 mos  FOLLOW-UP APPT SCHEDULED WITH REFERRING PROVIDER: No    SUBJECTIVE:                                                                                                                                                                                         SUBJECTIVE STATEMENT:     Patient reports to OPPT with R shoulder pain.   PERTINENT HISTORY:   Xavier Davis is a 83 y.o. male presenting with R shoulder pain. 6 mos ago patient reports he was pulling on a tree limb and he felt a sudden shock in his R arm. Patient reports that the pain disrupts his sleep cycle throughout the night. Additionally he reports that it painful to lift his arm past 90 degrees. Patient tried ice initially with minimal relief. Patient still continues to use RUE to perform ADLs around the house although pain is present   Patient denies chills/fever, night sweats, nausea  Hand Dominance: R Hand   Imaging: (Per Chart Review 08/24/24):  Dr. Ozell Fairy [place]ake: X-rays conducted on August 12, 2024, revealed moderate AC degenerative changes, joint space narrowing, and some inferior spurring of the glenohumeral joint, as well as AC arthritis.   PAIN:  Pain Intensity: Present: 1/10, Best: 0/10, Worst: 6-7/10 Pain location: R Shoulder  Pain Quality: constant and aching  Radiating: No  Numbness/Tingling: No History of prior shoulder or neck/shoulder injury, pain, surgery, or therapy: No  PRECAUTIONS: None  WEIGHT BEARING RESTRICTIONS: No  FALLS: Has patient fallen in last 6 months? No  Living Environment Lives with: lives with their spouse Lives in: House/apartment Stairs: No Has following equipment at home: None  Prior level of function: Independent  Occupational demands: Retired  Hobbies: Outdoors, Gardening  Patient Goals: Be able to lift my arm without pain and reduce the aching at night.     OBJECTIVE:   Patient Surveys  QUICK DASH  Please rate your ability do the following activities in the last week by selecting the number below the appropriate response.   Activities Rating  Open a tight or new jar.  1 = No difficulty   Do heavy household chores (e.g., wash walls, floors). 1 = No difficulty   Carry a shopping bag or briefcase 1 = No difficulty   Wash  your back. 1 = No difficulty   Use a knife to cut food. 1 = No difficulty   Recreational activities in which you take some force  or impact through your arm, shoulder or hand (e.g., golf, hammering, tennis, etc.). 2 = Mild difficulty  During the past week, to what extent has your arm, shoulder or hand problem interfered with your normal social activities with family, friends, neighbors or groups?  1 = Not at all  During the past week, were you limited in your work or other regular daily activities as a result of your arm, shoulder or hand problem? 2 = Slightly limited  Rate the severity of the following symptoms in the last week: Arm, Shoulder, or hand pain. 2 = Mild  Rate the severity of the following symptoms in the last week: Tingling (pins and needles) in your arm, shoulder or hand. 1 = none  During the past week, how much difficulty have you had sleeping because of the pain in your arm, shoulder or hand?  2 = Mild difficulty   (A QuickDASH score may not be calculated if there is greater than 1 missing item.)  Quick Dash Disability/Symptom Score: 9.1%  Minimally Clinically Important Difference (MCID): 15-20 points  Flavio, F. et al. (2013). Minimally clinically important difference of the disabilities of the arm, shoulder, and hand outcome measures (DASH) and its shortened version (Quick DASH). Journal of Orthopaedic & Sports Physical Therapy, 44(1), 30-39)   Cognition Patient is oriented to person, place, and time.  Recent memory is intact.  Remote memory is intact.  Attention span and concentration are intact.  Expressive speech is intact.  Patient's fund of knowledge is within normal limits for educational level.    Gross Musculoskeletal Assessment Tremor: None Bulk: Normal Tone: Normal  Gait   Posture   Cervical Screen AROM: WFL and painless with overpressure in all planes Spurlings A (ipsilateral lateral flexion/axial compression): R: Negative    AROM AROM  (Normal range in degrees) AROM  Cervical  Flexion (50)   Extension (80)   Right lateral flexion (45)   Left lateral flexion (45)   Right rotation (85)   Left rotation (85)    Right Left  Shoulder    Flexion 130 140  Extension    Abduction [facility]  WFL   External Rotation WFL* WFL   Internal Rotation    Hands Behind Head [facility] WFL  Hands Behind Back [facility] WFL      Elbow    Flexion    Extension    Pronation    Supination    (* = pain; Blank rows = not tested)  UE MMT: MMT (out of 5) Right Left   Cervical (isometric)  Flexion WNL  Extension WNL  Lateral Flexion WNL WNL  Rotation WNL WNL      Shoulder   Flexion 3+* 4+  Extension    Abduction 4-* 4+  External rotation 4-* 5  Internal rotation 5 5  Horizontal abduction    Horizontal adduction    Lower Trapezius    Rhomboids        Elbow  Flexion 5 5  Extension 5 5  Pronation    Supination        Wrist  Flexion    Extension    Radial deviation    Ulnar deviation        MCP  Flexion    Extension    Abduction    Adduction    (* = pain; Blank rows = not tested)  Sensation Grossly intact to light touch bilateral UE as determined by testing dermatomes C2-T2. Proprioception and hot/cold testing deferred on this date.  Reflexes Deferred   Palpation Location LEFT  RIGHT           Subocciptials    Cervical paraspinals    Upper Trapezius  0  Levator Scapulae  0  Rhomboid Major/Minor    Sternoclavicular joint    Acromioclavicular joint    Coracoid process    Long head of biceps    Supraspinatus  0  Infraspinatus  0  Subscapularis  0  Teres Minor    Teres Major    Pectoralis Major    Pectoralis Minor    Anterior Deltoid    Lateral Deltoid    Posterior Deltoid    Latissimus Dorsi    Sternocleidomastoid    (Blank rows = not tested) Graded on 0-4 scale (0 = no pain, 1 = pain, 2 = pain with wincing/grimacing/flinching, 3 = pain with withdrawal, 4 = unwilling to allow palpation), (Blank rows = not  tested)   Passive Accessory Intervertebral Motion Deferred  SPECIAL TESTS Rotator Cuff  Drop Arm Test: Negative Painful Arc (Pain from 60 to 120 degrees scaption): Positive Infraspinatus Muscle Test: Positive If all 3 tests positive, the probability of a full-thickness rotator cuff tear is 91%  TODAY'S TREATMENT  DATE: 10/05/24  Subjective:  Patient reports minimal pain in the R shoulder ONLY with abduction/flexion. Patient report 1/10 NPS with movement.  No further questions or concerns.   Therapeutic Activity (focused on improving shoulder strength in order to optimize raking, lifting and yard work such as sweeping):  UBE - 5 min - 2.5 min Fwd, 2.5 min Retro - Level 14-12 for UE  strength and muscular endurance; PT manually adjusted resistance throughout to patient's tolerance.    Seated Shoulder ER - R Elbow supported on Knee   R: 1 x 10 - 3#  R: 2 x 10 - 4#   Standing Shoulder Fly (0-60 deg abd)   2 x 10 - 3# DB   2 x 10 - 4# DB   Seated Shoulder Flexion with TB around wrist   3 x 10 - YTB    Shoulder Taps on Incline Mat Table   1 x 10 - Incline Push Up   2 x 20  Reviewed Dumbbell ER with table support for rotator cuff strength.   Therapeutic Exercise (with intent to strengthen rotator cuff and shoulder muscles to address muscle weakness):  Seated Scapular Row    3 x 10 - 20# 1 x 10 - 25#      Seated Lat Pull Down    2 x 10 - 25#    PATIENT EDUCATION:  Education details: Exercise Technique  Person educated: Patient Education method: Explanation, Demonstration, and Handouts Education comprehension: verbalized understanding and returned demonstration   HOME EXERCISE PROGRAM:  Access Code: [email or web address] URL: [email or web address] Date: 10/05/2024 Prepared by: Lonni Pall  Exercises - Seated Scapular Retraction  - 1-2 x daily - 3-4 x weekly - 2-3 sets - 10-12 reps - Sleeper Stretch  - 1 x daily - 7 x weekly - 3 sets - 30s hold - Doorway Pec  Stretch at 90 Degrees Abduction  - 1 x daily - 7 x weekly - 3 sets - 30s hold - Standing Shoulder Posterior Capsule Stretch  - 1 x daily - 7 x weekly - 3 sets - 30s hold - Standing Isometric Shoulder External Rotation with Doorway  - 1 x daily - 7 x weekly - 3-5 sets - 10s hold - Standing Shoulder Row with Anchored Resistance  -  1 x daily - 3-4 x weekly - 2-3 sets - 10-12 reps - Shoulder External Rotation with Anchored Resistance with Towel Under Elbow  - 1 x daily - 3-4 x weekly - 2-3 sets - 10-12 reps - Shoulder External Rotation and Scapular Retraction with Resistance  - 1-2 x daily - 3-4 x weekly - 2-3 sets - 10-12 reps - Shoulder Internal Rotation with Resistance  - 1 x daily - 3-4 x weekly - 2-3 sets - 10-12 reps - Sidelying Shoulder Horizontal Abduction  - 1 x daily - 3-4 x weekly - 2-3 sets - 10-12 reps - Seated Shoulder External Rotation in Abduction Supported with Dumbbell  - 1 x daily - 3-4 x weekly - 2-3 sets - 10-12 reps  Access Code: [email or web address] URL: [email or web address] Date: 09/23/2024 Prepared by: Lonni Pall  Exercises - Seated Scapular Retraction  - 1-2 x daily - 3-4 x weekly - 2-3 sets - 10-12 reps - Sleeper Stretch  - 1 x daily - 7 x weekly - 3 sets - 30s hold - Doorway Pec Stretch at 90 Degrees Abduction  - 1 x daily - 7 x weekly - 3 sets - 30s hold - Standing Shoulder Posterior Capsule Stretch  - 1 x daily - 7 x weekly - 3 sets - 30s hold - Standing Isometric Shoulder External Rotation with Doorway  - 1 x daily - 7 x weekly - 3-5 sets - 10s hold - Standing Shoulder Row with Anchored Resistance  - 1 x daily - 3-4 x weekly - 2-3 sets - 10-12 reps - Shoulder External Rotation with Anchored Resistance with Towel Under Elbow  - 1 x daily - 3-4 x weekly - 2-3 sets - 10-12 reps - Shoulder External Rotation and Scapular Retraction with Resistance  - 1-2 x daily - 3-4 x weekly - 2-3 sets - 10-12 reps - Shoulder Internal Rotation with Resistance  - 1 x daily - 3-4  x weekly - 2-3 sets - 10-12 reps - Sidelying Shoulder Horizontal Abduction  - 1 x daily - 3-4 x weekly - 2-3 sets - 10-12 reps  Access Code: [email or web address] URL: [email or web address] Date: 09/01/2024 Prepared by: Lonni Pall  Exercises - Standing Isometric Shoulder External Rotation with Doorway  - 1 x daily - 7 x weekly - 3-5 sets - 10s hold - Seated Scapular Retraction  - 1-2 x daily - 3-4 x weekly - 2-3 sets - 10-12 reps - Shoulder External Rotation and Scapular Retraction with Resistance  - 1-2 x daily - 3-4 x weekly - 2-3 sets - 10-12 reps  ASSESSMENT:  CLINICAL IMPRESSION: Patient returned to OPPT in management of R shoulder pain secondary to RC weakness. Continued progressive overloading of shoulder musculature and rotator cuff muscles in RUE. Good ability to perform CKC in push up position without pain. Pain continues to improve with OH activities. PT plans to continue strengthening in [facility] for rotator cuff group in order to improve OH movements/lifting pain. Based on today's performance, pt will continue to benefit from skilled PT in order to facilitate return to PLOF and improve QoL.   OBJECTIVE IMPAIRMENTS: decreased activity tolerance, decreased ROM, decreased strength, and pain.   ACTIVITY LIMITATIONS: carrying, lifting, sleeping, and reach over head  PARTICIPATION LIMITATIONS: yard work  PERSONAL FACTORS: Age, Time since onset of injury/illness/exacerbation, and 3+ comorbidities: Hx of NSTEMI, HTN, CHF are also affecting patient's functional outcome.   REHAB POTENTIAL: Good  CLINICAL DECISION MAKING: Evolving/moderate complexity  EVALUATION COMPLEXITY: Moderate   GOALS:  Goals reviewed with patient? Yes  SHORT TERM GOALS: Target date: 09/29/2024  Pt will be independent with HEP to improve strength and decrease shoulder pain to improve pain-free function at home and work. Baseline: 09/01/2024: Initial HEP given Goal status: INITIAL   LONG TERM GOALS:  Target date: 10/27/2024  Pt will increase R shoulder flexion to 140 degrees without pain in order to demonstrate limb symmetry and significantly improvements in ROM/pain for overhead ADLs. Baseline: R: 130 deg Goal status: INITIAL  2.  Pt will decrease worst shoulder pain by at least 3 points on the NPRS in order to demonstrate clinically significant reduction in shoulder pain. Baseline: 09/01/2024: 7/10 NPS Goal status: INITIAL  3.  Pt will decrease quick DASH score by at least 8% in order to demonstrate clinically significant reduction in disability related to shoulder pain        Baseline: 09/01/2024: 9.1%/100 (Minimal Disability) Goal status: INITIAL  4. Pt will increase strength to a 4+ MMT grade with R shoulder flexion, adb, and external rotation in order to demonstrate improvement in strength and function         Baseline: 09/01/2024:  Shoulder  R/L   Flexion 3+* 4+  Extension    Abduction 4-* 4+  External rotation 4-* 5   Goal status: INITIAL  5.  Pt will achieve 6-8 hours of sleep per night without report of awakening from pain in order to demonstrate significant improvements in pain.  Baseline: 09/01/2024: Patient wakes multiple times per night due to pain.  Goal status: INITIAL    PLAN: PT FREQUENCY: 1-2x/week  PT DURATION: 8 weeks  PLANNED INTERVENTIONS: Therapeutic exercises, Therapeutic activity, Neuromuscular re-education, Balance training, Gait training, Patient/Family education, Self Care, Joint mobilization, Joint manipulation, Vestibular training, Canalith repositioning, Orthotic/Fit training, DME instructions, Dry Needling, Electrical stimulation, Spinal manipulation, Spinal mobilization, Cryotherapy, Moist heat, Taping, Traction, Ultrasound, Ionotophoresis 4mg /ml Dexamethasone, Manual therapy, and Re-evaluation.  PLAN FOR NEXT SESSION: Review HEP, Progress RC strengthening, external rotation focus, Isometrics, review goals   Lonni Pall PT,  DPT Physical Therapist- [place]  10/05/2024, 10:36 AM

## 2024-10-07 ENCOUNTER — Ambulatory Visit

## 2024-10-07 DIAGNOSIS — M25511 Pain in right shoulder: Secondary | ICD-10-CM | POA: Diagnosis not present

## 2024-10-07 DIAGNOSIS — M6281 Muscle weakness (generalized): Secondary | ICD-10-CM

## 2024-10-07 DIAGNOSIS — G8929 Other chronic pain: Secondary | ICD-10-CM

## 2024-10-07 NOTE — Therapy (Signed)
 OUTPATIENT PHYSICAL THERAPY SHOULDER/ELBOW TREATMENT  Patient Name: Xavier Davis MRN: [id number] DOB:October 25, 1941, 83 y.o., male Today's Date: 10/07/2024  END OF SESSION:  PT End of Session - 10/07/24 1035     Visit Number 9    Number of Visits 17    Date for Recertification  10/27/24    PT Start Time 1033    PT Stop Time 1115    PT Time Calculation (min) 42 min    Activity Tolerance Patient tolerated treatment well    Behavior During Therapy [facility] for tasks assessed/performed              Past Medical History:  Diagnosis Date   Anxiety    Atrial fibrillation (HCC)    BPH (benign prostatic hyperplasia)    Carotid artery disease    s/p left carotid endarterectomy at [facility] in 2014   Carotid stenosis    bilateral   CKD (chronic kidney disease)    Elevated PSA    Fracture, thoracic vertebra (HCC) 05/06/2018   T3,T9 endplate fracture   Hemothorax on left 05/06/2018   HLD (hyperlipidemia)    HTN (hypertension)    Hyperlipidemia    Hypertension    Insomnia    Rib fractures 05/06/2018   Weak urinary stream    Past Surgical History:  Procedure Laterality Date   APPENDECTOMY     CAROTID PTA/STENT INTERVENTION Right 02/26/2022   Procedure: CAROTID PTA/STENT INTERVENTION;  Surgeon: Marea Selinda RAMAN, MD;  Location: [facility] INVASIVE CV LAB;  Service: Cardiovascular;  Laterality: Right;   carotid stenosis Left    ces and stent placement   cataract surgery Bilateral    Patient Active Problem List   Diagnosis Date Noted   Congestive heart failure (HCC) 06/29/2022   Encounter for therapeutic drug level monitoring 06/29/2022   Erectile dysfunction 06/29/2022   History of anemia 06/29/2022   Hypertensive heart and chronic kidney disease with heart failure and stage 1 through stage 4 chronic kidney disease, or unspecified chronic kidney disease (HCC) 06/29/2022   Carotid artery stenosis 06/29/2022   Chronic kidney disease 06/29/2022   Atrial fibrillation (HCC) 06/29/2022    Unspecified atrial fibrillation (HCC) 06/29/2022   Bilateral leg edema 05/03/2022   Bradycardia 05/03/2022   Acute on chronic diastolic CHF (congestive heart failure) (HCC) 03/01/2022   NSTEMI (non-ST elevated myocardial infarction) (HCC) 02/28/2022   Carotid stenosis, right 02/26/2022   Abnormal ECG 01/15/2022   Combined forms of age-related cataract, bilateral 12/26/2021   Benign neoplasm of colon 01/13/2020   Elevated prostate specific antigen (PSA) 01/13/2020   Insomnia 01/13/2020   Other ill-defined and unknown causes of morbidity and mortality 01/13/2020   Psychosexual dysfunction with inhibited sexual excitement 01/13/2020   Status post vasectomy 01/13/2020   DDD (degenerative disc disease), lumbar 07/30/2018   Anxiety 07/28/2018   Carotid stenosis 07/28/2018   CKD (chronic kidney disease) stage 3, GFR 30-59 ml/min 07/28/2018   Elevated blood sugar 07/28/2018   Occlusion and stenosis of carotid artery 07/28/2018   Paroxysmal atrial fibrillation (HCC) 07/03/2018   Leg swelling 07/03/2018   Paroxysmal A-fib (HCC) 07/03/2018   Fall from ladder 04/16/2018   Bilateral carotid artery stenosis 10/23/2016   Hypertension 10/23/2016   Hyperlipidemia 10/23/2016   Incomplete bladder emptying 07/02/2015   Benign prostatic hyperplasia 06/23/2015    PCP:  Sadie Manna, MD      REFERRING PROVIDER:  Kathlynn Sharper, MD      REFERRING DIAG:  S46.011A (ICD-10-CM) - Strain of muscle(s) and tendon(s)  of the rotator cuff of right shoulder, initial encounter    RATIONALE FOR EVALUATION AND TREATMENT: Rehabilitation  THERAPY DIAG: Chronic right shoulder pain  Muscle weakness (generalized)  ONSET DATE: 6 mos  FOLLOW-UP APPT SCHEDULED WITH REFERRING PROVIDER: No    SUBJECTIVE:                                                                                                                                                                                         SUBJECTIVE  STATEMENT:    Patient reports to OPPT with R shoulder pain.   PERTINENT HISTORY:   Xavier Davis is a 83 y.o. male presenting with R shoulder pain. 6 mos ago patient reports he was pulling on a tree limb and he felt a sudden shock in his R arm. Patient reports that the pain disrupts his sleep cycle throughout the night. Additionally he reports that it painful to lift his arm past 90 degrees. Patient tried ice initially with minimal relief. Patient still continues to use RUE to perform ADLs around the house although pain is present   Patient denies chills/fever, night sweats, nausea  Hand Dominance: R Hand   Imaging: (Per Chart Review 08/24/24):  Dr. Ozell Fairy [place]ake: X-rays conducted on August 12, 2024, revealed moderate AC degenerative changes, joint space narrowing, and some inferior spurring of the glenohumeral joint, as well as AC arthritis.   PAIN:  Pain Intensity: Present: 1/10, Best: 0/10, Worst: 6-7/10 Pain location: R Shoulder  Pain Quality: constant and aching  Radiating: No  Numbness/Tingling: No History of prior shoulder or neck/shoulder injury, pain, surgery, or therapy: No  PRECAUTIONS: None  WEIGHT BEARING RESTRICTIONS: No  FALLS: Has patient fallen in last 6 months? No  Living Environment Lives with: lives with their spouse Lives in: House/apartment Stairs: No Has following equipment at home: None  Prior level of function: Independent  Occupational demands: Retired  Hobbies: Outdoors, Gardening  Patient Goals: Be able to lift my arm without pain and reduce the aching at night.     OBJECTIVE:   Patient Surveys  QUICK DASH  Please rate your ability do the following activities in the last week by selecting the number below the appropriate response.   Activities Rating  Open a tight or new jar.  1 = No difficulty   Do heavy household chores (e.g., wash walls, floors). 1 = No difficulty   Carry a shopping bag or briefcase 1 = No difficulty    Wash your back. 1 = No difficulty   Use a knife to cut food. 1 = No difficulty   Recreational activities in which you take some  force or impact through your arm, shoulder or hand (e.g., golf, hammering, tennis, etc.). 2 = Mild difficulty  During the past week, to what extent has your arm, shoulder or hand problem interfered with your normal social activities with family, friends, neighbors or groups?  1 = Not at all  During the past week, were you limited in your work or other regular daily activities as a result of your arm, shoulder or hand problem? 2 = Slightly limited  Rate the severity of the following symptoms in the last week: Arm, Shoulder, or hand pain. 2 = Mild  Rate the severity of the following symptoms in the last week: Tingling (pins and needles) in your arm, shoulder or hand. 1 = none  During the past week, how much difficulty have you had sleeping because of the pain in your arm, shoulder or hand?  2 = Mild difficulty   (A QuickDASH score may not be calculated if there is greater than 1 missing item.)  Quick Dash Disability/Symptom Score: 9.1%  Minimally Clinically Important Difference (MCID): 15-20 points  Flavio, F. et al. (2013). Minimally clinically important difference of the disabilities of the arm, shoulder, and hand outcome measures (DASH) and its shortened version (Quick DASH). Journal of Orthopaedic & Sports Physical Therapy, 44(1), 30-39)   Cognition Patient is oriented to person, place, and time.  Recent memory is intact.  Remote memory is intact.  Attention span and concentration are intact.  Expressive speech is intact.  Patient's fund of knowledge is within normal limits for educational level.    Gross Musculoskeletal Assessment Tremor: None Bulk: Normal Tone: Normal  Gait   Posture   Cervical Screen AROM: WFL and painless with overpressure in all planes Spurlings A (ipsilateral lateral flexion/axial compression): R: Negative     AROM AROM (Normal range in degrees) AROM  Cervical  Flexion (50)   Extension (80)   Right lateral flexion (45)   Left lateral flexion (45)   Right rotation (85)   Left rotation (85)    Right Left  Shoulder    Flexion 130 140  Extension    Abduction [facility]  WFL   External Rotation WFL* WFL   Internal Rotation    Hands Behind Head [facility] WFL  Hands Behind Back [facility] WFL      Elbow    Flexion    Extension    Pronation    Supination    (* = pain; Blank rows = not tested)  UE MMT: MMT (out of 5) Right Left   Cervical (isometric)  Flexion WNL  Extension WNL  Lateral Flexion WNL WNL  Rotation WNL WNL      Shoulder   Flexion 3+* 4+  Extension    Abduction 4-* 4+  External rotation 4-* 5  Internal rotation 5 5  Horizontal abduction    Horizontal adduction    Lower Trapezius    Rhomboids        Elbow  Flexion 5 5  Extension 5 5  Pronation    Supination        Wrist  Flexion    Extension    Radial deviation    Ulnar deviation        MCP  Flexion    Extension    Abduction    Adduction    (* = pain; Blank rows = not tested)  Sensation Grossly intact to light touch bilateral UE as determined by testing dermatomes C2-T2. Proprioception and hot/cold testing deferred on this  date.  Reflexes Deferred   Palpation Location LEFT  RIGHT           Subocciptials    Cervical paraspinals    Upper Trapezius  0  Levator Scapulae  0  Rhomboid Major/Minor    Sternoclavicular joint    Acromioclavicular joint    Coracoid process    Long head of biceps    Supraspinatus  0  Infraspinatus  0  Subscapularis  0  Teres Minor    Teres Major    Pectoralis Major    Pectoralis Minor    Anterior Deltoid    Lateral Deltoid    Posterior Deltoid    Latissimus Dorsi    Sternocleidomastoid    (Blank rows = not tested) Graded on 0-4 scale (0 = no pain, 1 = pain, 2 = pain with wincing/grimacing/flinching, 3 = pain with withdrawal, 4 = unwilling to allow palpation), (Blank  rows = not tested)   Passive Accessory Intervertebral Motion Deferred  SPECIAL TESTS Rotator Cuff  Drop Arm Test: Negative Painful Arc (Pain from 60 to 120 degrees scaption): Positive Infraspinatus Muscle Test: Positive If all 3 tests positive, the probability of a full-thickness rotator cuff tear is 91%  TODAY'S TREATMENT  DATE: 10/07/24  Subjective:  Patient reports no pain in the R shoulder prior to start of session. He did a little of the HEP program yesterday.   No further questions or concerns.   Therapeutic Activity (focused on improving shoulder strength in order to optimize raking, lifting and yard work such as sweeping):  UBE - 5 min - 2.5 min Fwd, 2.5 min Retro - Level 14-12 for UE  strength and muscular endurance; PT manually adjusted resistance throughout to patient's tolerance.    Seated Shoulder ER - R Elbow supported on blue bolster at 90 deg  R: 3 x 10 - 4#   Standing Shoulder Fly (0-60 deg abd)   2 x 10 - 4# DB   Seated Shoulder Flexion (OH Reach) with TB around wrist   3 x 10 - YTB    Shoulder Taps on Incline Mat Table   2 x 20 reps   Incline Push Up with Shoulder Tap   2 x 10 - Alternating Tap   Therapeutic Exercise (with intent to strengthen rotator cuff and shoulder muscles to address muscle weakness):  Seated Scapular Row    1 x 10 - 15#  2 x 10 - 25#      Seated Lat Pull Down    2 x 10 - 25#     2 x 10 - 30#    Standing Shoulder ER   1 x 10 - Red TB at neutral shoulder angle    2 x 10 - Red TB at 90 deg abduction  PATIENT EDUCATION:  Education details: Exercise Technique  Person educated: Patient Education method: Explanation, Demonstration, and Handouts Education comprehension: verbalized understanding and returned demonstration   HOME EXERCISE PROGRAM:  Access Code: [email or web address] URL: [email or web address] Date: 10/05/2024 Prepared by: Lonni Pall  Exercises - Seated Scapular Retraction  - 1-2 x daily - 3-4 x weekly  - 2-3 sets - 10-12 reps - Sleeper Stretch  - 1 x daily - 7 x weekly - 3 sets - 30s hold - Doorway Pec Stretch at 90 Degrees Abduction  - 1 x daily - 7 x weekly - 3 sets - 30s hold - Standing Shoulder Posterior Capsule Stretch  - 1 x daily - 7 x weekly - 3 sets -  30s hold - Standing Isometric Shoulder External Rotation with Doorway  - 1 x daily - 7 x weekly - 3-5 sets - 10s hold - Standing Shoulder Row with Anchored Resistance  - 1 x daily - 3-4 x weekly - 2-3 sets - 10-12 reps - Shoulder External Rotation with Anchored Resistance with Towel Under Elbow  - 1 x daily - 3-4 x weekly - 2-3 sets - 10-12 reps - Shoulder External Rotation and Scapular Retraction with Resistance  - 1-2 x daily - 3-4 x weekly - 2-3 sets - 10-12 reps - Shoulder Internal Rotation with Resistance  - 1 x daily - 3-4 x weekly - 2-3 sets - 10-12 reps - Sidelying Shoulder Horizontal Abduction  - 1 x daily - 3-4 x weekly - 2-3 sets - 10-12 reps - Seated Shoulder External Rotation in Abduction Supported with Dumbbell  - 1 x daily - 3-4 x weekly - 2-3 sets - 10-12 reps  Access Code: [email or web address] URL: [email or web address] Date: 09/23/2024 Prepared by: Lonni Pall  Exercises - Seated Scapular Retraction  - 1-2 x daily - 3-4 x weekly - 2-3 sets - 10-12 reps - Sleeper Stretch  - 1 x daily - 7 x weekly - 3 sets - 30s hold - Doorway Pec Stretch at 90 Degrees Abduction  - 1 x daily - 7 x weekly - 3 sets - 30s hold - Standing Shoulder Posterior Capsule Stretch  - 1 x daily - 7 x weekly - 3 sets - 30s hold - Standing Isometric Shoulder External Rotation with Doorway  - 1 x daily - 7 x weekly - 3-5 sets - 10s hold - Standing Shoulder Row with Anchored Resistance  - 1 x daily - 3-4 x weekly - 2-3 sets - 10-12 reps - Shoulder External Rotation with Anchored Resistance with Towel Under Elbow  - 1 x daily - 3-4 x weekly - 2-3 sets - 10-12 reps - Shoulder External Rotation and Scapular Retraction with Resistance  - 1-2 x daily  - 3-4 x weekly - 2-3 sets - 10-12 reps - Shoulder Internal Rotation with Resistance  - 1 x daily - 3-4 x weekly - 2-3 sets - 10-12 reps - Sidelying Shoulder Horizontal Abduction  - 1 x daily - 3-4 x weekly - 2-3 sets - 10-12 reps  Access Code: [email or web address] URL: [email or web address] Date: 09/01/2024 Prepared by: Lonni Pall  Exercises - Standing Isometric Shoulder External Rotation with Doorway  - 1 x daily - 7 x weekly - 3-5 sets - 10s hold - Seated Scapular Retraction  - 1-2 x daily - 3-4 x weekly - 2-3 sets - 10-12 reps - Shoulder External Rotation and Scapular Retraction with Resistance  - 1-2 x daily - 3-4 x weekly - 2-3 sets - 10-12 reps  ASSESSMENT:  CLINICAL IMPRESSION: Patient returned to OPPT in management of R shoulder pain secondary to RC weakness. Session focused on continued rotator cuff/posterior shoulder strengthening. Good carryover from the previous session to perform CKC stability with incline shoulder taps; added push up with shoulder taps to increase difficulty. Seated rest breaks provided in order to mitigate fatigue. PT plans to continue strengthening in [facility] for rotator cuff group in order to improve OH movements/lifting pain. Based on today's performance, pt will continue to benefit from skilled PT in order to facilitate return to PLOF and improve QoL.   OBJECTIVE IMPAIRMENTS: decreased activity tolerance, decreased ROM, decreased strength, and pain.   ACTIVITY LIMITATIONS: carrying, lifting, sleeping, and reach over head  PARTICIPATION  LIMITATIONS: yard work  PERSONAL FACTORS: Age, Time since onset of injury/illness/exacerbation, and 3+ comorbidities: Hx of NSTEMI, HTN, CHF are also affecting patient's functional outcome.   REHAB POTENTIAL: Good  CLINICAL DECISION MAKING: Evolving/moderate complexity  EVALUATION COMPLEXITY: Moderate   GOALS: Goals reviewed with patient? Yes  SHORT TERM GOALS: Target date: 09/29/2024  Pt will be independent  with HEP to improve strength and decrease shoulder pain to improve pain-free function at home and work. Baseline: 09/01/2024: Initial HEP given Goal status: INITIAL   LONG TERM GOALS: Target date: 10/27/2024  Pt will increase R shoulder flexion to 140 degrees without pain in order to demonstrate limb symmetry and significantly improvements in ROM/pain for overhead ADLs. Baseline: R: 130 deg Goal status: INITIAL  2.  Pt will decrease worst shoulder pain by at least 3 points on the NPRS in order to demonstrate clinically significant reduction in shoulder pain. Baseline: 09/01/2024: 7/10 NPS Goal status: INITIAL  3.  Pt will decrease quick DASH score by at least 8% in order to demonstrate clinically significant reduction in disability related to shoulder pain        Baseline: 09/01/2024: 9.1%/100 (Minimal Disability) Goal status: INITIAL  4. Pt will increase strength to a 4+ MMT grade with R shoulder flexion, adb, and external rotation in order to demonstrate improvement in strength and function         Baseline: 09/01/2024:  Shoulder  R/L   Flexion 3+* 4+  Extension    Abduction 4-* 4+  External rotation 4-* 5   Goal status: INITIAL  5.  Pt will achieve 6-8 hours of sleep per night without report of awakening from pain in order to demonstrate significant improvements in pain.  Baseline: 09/01/2024: Patient wakes multiple times per night due to pain.  Goal status: INITIAL    PLAN: PT FREQUENCY: 1-2x/week  PT DURATION: 8 weeks  PLANNED INTERVENTIONS: Therapeutic exercises, Therapeutic activity, Neuromuscular re-education, Balance training, Gait training, Patient/Family education, Self Care, Joint mobilization, Joint manipulation, Vestibular training, Canalith repositioning, Orthotic/Fit training, DME instructions, Dry Needling, Electrical stimulation, Spinal manipulation, Spinal mobilization, Cryotherapy, Moist heat, Taping, Traction, Ultrasound, Ionotophoresis 4mg /ml  Dexamethasone, Manual therapy, and Re-evaluation.  PLAN FOR NEXT SESSION: Progress Note, Progress RC strengthening, external rotation focus, Isometrics, review goals   Lonni Pall PT, DPT Physical Therapist[facility]  10/07/2024, 10:36 AM

## 2024-10-12 ENCOUNTER — Ambulatory Visit

## 2024-10-12 DIAGNOSIS — M6281 Muscle weakness (generalized): Secondary | ICD-10-CM

## 2024-10-12 DIAGNOSIS — M25511 Pain in right shoulder: Secondary | ICD-10-CM | POA: Diagnosis not present

## 2024-10-12 DIAGNOSIS — G8929 Other chronic pain: Secondary | ICD-10-CM

## 2024-10-12 NOTE — Therapy (Signed)
 OUTPATIENT PHYSICAL THERAPY SHOULDER/ELBOW TREATMENT/PROGRESS NOTE Dates of reporting period  09/01/2024   to   10/12/2024    Patient Name: Xavier Davis MRN: [id number] DOB:1940/12/31, 83 y.o., male Today's Date: 10/12/2024  END OF SESSION:  PT End of Session - 10/12/24 1300     Visit Number 10    Number of Visits 17    Date for Recertification  10/27/24    PT Start Time 1300    PT Stop Time 1340    PT Time Calculation (min) 40 min    Activity Tolerance Patient tolerated treatment well    Behavior During Therapy [facility] for tasks assessed/performed              Past Medical History:  Diagnosis Date   Anxiety    Atrial fibrillation (HCC)    BPH (benign prostatic hyperplasia)    Carotid artery disease    s/p left carotid endarterectomy at [facility] in 2014   Carotid stenosis    bilateral   CKD (chronic kidney disease)    Elevated PSA    Fracture, thoracic vertebra (HCC) 05/06/2018   T3,T9 endplate fracture   Hemothorax on left 05/06/2018   HLD (hyperlipidemia)    HTN (hypertension)    Hyperlipidemia    Hypertension    Insomnia    Rib fractures 05/06/2018   Weak urinary stream    Past Surgical History:  Procedure Laterality Date   APPENDECTOMY     CAROTID PTA/STENT INTERVENTION Right 02/26/2022   Procedure: CAROTID PTA/STENT INTERVENTION;  Surgeon: Marea Selinda RAMAN, MD;  Location: [facility] INVASIVE CV LAB;  Service: Cardiovascular;  Laterality: Right;   carotid stenosis Left    ces and stent placement   cataract surgery Bilateral    Patient Active Problem List   Diagnosis Date Noted   Congestive heart failure (HCC) 06/29/2022   Encounter for therapeutic drug level monitoring 06/29/2022   Erectile dysfunction 06/29/2022   History of anemia 06/29/2022   Hypertensive heart and chronic kidney disease with heart failure and stage 1 through stage 4 chronic kidney disease, or unspecified chronic kidney disease (HCC) 06/29/2022   Carotid artery stenosis 06/29/2022   Chronic  kidney disease 06/29/2022   Atrial fibrillation (HCC) 06/29/2022   Unspecified atrial fibrillation (HCC) 06/29/2022   Bilateral leg edema 05/03/2022   Bradycardia 05/03/2022   Acute on chronic diastolic CHF (congestive heart failure) (HCC) 03/01/2022   NSTEMI (non-ST elevated myocardial infarction) (HCC) 02/28/2022   Carotid stenosis, right 02/26/2022   Abnormal ECG 01/15/2022   Combined forms of age-related cataract, bilateral 12/26/2021   Benign neoplasm of colon 01/13/2020   Elevated prostate specific antigen (PSA) 01/13/2020   Insomnia 01/13/2020   Other ill-defined and unknown causes of morbidity and mortality 01/13/2020   Psychosexual dysfunction with inhibited sexual excitement 01/13/2020   Status post vasectomy 01/13/2020   DDD (degenerative disc disease), lumbar 07/30/2018   Anxiety 07/28/2018   Carotid stenosis 07/28/2018   CKD (chronic kidney disease) stage 3, GFR 30-59 ml/min 07/28/2018   Elevated blood sugar 07/28/2018   Occlusion and stenosis of carotid artery 07/28/2018   Paroxysmal atrial fibrillation (HCC) 07/03/2018   Leg swelling 07/03/2018   Paroxysmal A-fib (HCC) 07/03/2018   Fall from ladder 04/16/2018   Bilateral carotid artery stenosis 10/23/2016   Hypertension 10/23/2016   Hyperlipidemia 10/23/2016   Incomplete bladder emptying 07/02/2015   Benign prostatic hyperplasia 06/23/2015    PCP:  Sadie Manna, MD      REFERRING PROVIDER:  Kathlynn Sharper, MD  REFERRING DIAG:  S46.011A (ICD-10-CM) - Strain of muscle(s) and tendon(s) of the rotator cuff of right shoulder, initial encounter    RATIONALE FOR EVALUATION AND TREATMENT: Rehabilitation  THERAPY DIAG: No diagnosis found.  ONSET DATE: 6 mos  FOLLOW-UP APPT SCHEDULED WITH REFERRING PROVIDER: No    SUBJECTIVE:                                                                                                                                                                                          SUBJECTIVE STATEMENT:    Patient reports to OPPT with R shoulder pain.   PERTINENT HISTORY:   Xavier Davis is a 83 y.o. male presenting with R shoulder pain. 6 mos ago patient reports he was pulling on a tree limb and he felt a sudden shock in his R arm. Patient reports that the pain disrupts his sleep cycle throughout the night. Additionally he reports that it painful to lift his arm past 90 degrees. Patient tried ice initially with minimal relief. Patient still continues to use RUE to perform ADLs around the house although pain is present   Patient denies chills/fever, night sweats, nausea  Hand Dominance: R Hand   Imaging: (Per Chart Review 08/24/24):  Dr. Ozell Fairy [place]: X-rays conducted on August 12, 2024, revealed moderate AC degenerative changes, joint space narrowing, and some inferior spurring of the glenohumeral joint, as well as AC arthritis.   PAIN:  Pain Intensity: Present: 1/10, Best: 0/10, Worst: 6-7/10 Pain location: R Shoulder  Pain Quality: constant and aching  Radiating: No  Numbness/Tingling: No History of prior shoulder or neck/shoulder injury, pain, surgery, or therapy: No  PRECAUTIONS: None  WEIGHT BEARING RESTRICTIONS: No  FALLS: Has patient fallen in last 6 months? No  Living Environment Lives with: lives with their spouse Lives in: House/apartment Stairs: No Has following equipment at home: None  Prior level of function: Independent  Occupational demands: Retired  Hobbies: Outdoors, Gardening  Patient Goals: Be able to lift my arm without pain and reduce the aching at night.     OBJECTIVE:   Patient Surveys  QUICK DASH  Please rate your ability do the following activities in the last week by selecting the number below the appropriate response.   Activities Rating  Open a tight or new jar.  1 = No difficulty   Do heavy household chores (e.g., wash walls, floors). 1 = No difficulty   Carry a shopping bag or  briefcase 1 = No difficulty   Wash your back. 1 = No difficulty   Use a knife to cut food. 1 = No difficulty   Recreational  activities in which you take some force or impact through your arm, shoulder or hand (e.g., golf, hammering, tennis, etc.). 2 = Mild difficulty  During the past week, to what extent has your arm, shoulder or hand problem interfered with your normal social activities with family, friends, neighbors or groups?  1 = Not at all  During the past week, were you limited in your work or other regular daily activities as a result of your arm, shoulder or hand problem? 2 = Slightly limited  Rate the severity of the following symptoms in the last week: Arm, Shoulder, or hand pain. 2 = Mild  Rate the severity of the following symptoms in the last week: Tingling (pins and needles) in your arm, shoulder or hand. 1 = none  During the past week, how much difficulty have you had sleeping because of the pain in your arm, shoulder or hand?  2 = Mild difficulty   (A QuickDASH score may not be calculated if there is greater than 1 missing item.)  Quick Dash Disability/Symptom Score: 9.1%  Minimally Clinically Important Difference (MCID): 15-20 points  Flavio, F. et al. (2013). Minimally clinically important difference of the disabilities of the arm, shoulder, and hand outcome measures (DASH) and its shortened version (Quick DASH). Journal of Orthopaedic & Sports Physical Therapy, 44(1), 30-39)   Cognition Patient is oriented to person, place, and time.  Recent memory is intact.  Remote memory is intact.  Attention span and concentration are intact.  Expressive speech is intact.  Patient's fund of knowledge is within normal limits for educational level.    Gross Musculoskeletal Assessment Tremor: None Bulk: Normal Tone: Normal  Gait   Posture   Cervical Screen AROM: WFL and painless with overpressure in all planes Spurlings A (ipsilateral lateral flexion/axial  compression): R: Negative    AROM AROM (Normal range in degrees) AROM  Cervical  Flexion (50)   Extension (80)   Right lateral flexion (45)   Left lateral flexion (45)   Right rotation (85)   Left rotation (85)    Right Left  Shoulder    Flexion 130 140  Extension    Abduction [facility]  WFL   External Rotation WFL* WFL   Internal Rotation    Hands Behind Head [facility] WFL  Hands Behind Back [facility] WFL      Elbow    Flexion    Extension    Pronation    Supination    (* = pain; Blank rows = not tested)  UE MMT: MMT (out of 5) Right Left   Cervical (isometric)  Flexion WNL  Extension WNL  Lateral Flexion WNL WNL  Rotation WNL WNL      Shoulder   Flexion 3+* 4+  Extension    Abduction 4-* 4+  External rotation 4-* 5  Internal rotation 5 5  Horizontal abduction    Horizontal adduction    Lower Trapezius    Rhomboids        Elbow  Flexion 5 5  Extension 5 5  Pronation    Supination        Wrist  Flexion    Extension    Radial deviation    Ulnar deviation        MCP  Flexion    Extension    Abduction    Adduction    (* = pain; Blank rows = not tested)  Sensation Grossly intact to light touch bilateral UE as determined by testing dermatomes C2-T2. Proprioception  and hot/cold testing deferred on this date.  Reflexes Deferred   Palpation Location LEFT  RIGHT           Subocciptials    Cervical paraspinals    Upper Trapezius  0  Levator Scapulae  0  Rhomboid Major/Minor    Sternoclavicular joint    Acromioclavicular joint    Coracoid process    Long head of biceps    Supraspinatus  0  Infraspinatus  0  Subscapularis  0  Teres Minor    Teres Major    Pectoralis Major    Pectoralis Minor    Anterior Deltoid    Lateral Deltoid    Posterior Deltoid    Latissimus Dorsi    Sternocleidomastoid    (Blank rows = not tested) Graded on 0-4 scale (0 = no pain, 1 = pain, 2 = pain with wincing/grimacing/flinching, 3 = pain with withdrawal, 4 = unwilling to  allow palpation), (Blank rows = not tested)   Passive Accessory Intervertebral Motion Deferred  SPECIAL TESTS Rotator Cuff  Drop Arm Test: Negative Painful Arc (Pain from 60 to 120 degrees scaption): Positive Infraspinatus Muscle Test: Positive If all 3 tests positive, the probability of a full-thickness rotator cuff tear is 91%  TODAY'S TREATMENT  DATE: 10/12/24  Subjective:  Patient reports that his shoulder pain has improved but there is a minor twinge in his R bicep. No further questions or concerns.   Therapeutic Activity (focused on improving shoulder strength in order to optimize raking, lifting and yard work such as sweeping):  UBE - 5 min - 2.5 min Fwd, 2.5 min Retro - Level 14-12 for UE  strength and muscular endurance; PT manually adjusted resistance throughout to patient's tolerance.    Standing Curl (Weighted Dowel - 6#)   1 x 10 - WD   1 x 10 - 5# added   1 x 10 - 10# added   Shoulder Flexion with Supinated Grip   1 x 10 - 5#   1 x 10 - 10#  Standing Shoulder Press   3 x 10 reps - 5# ea arm  Reverse Bosu Push Ups (on elevated mat table)   2 x 10 reps   Therapeutic Exercise (with intent to strengthen rotator cuff and shoulder muscles to address muscle weakness):    Standing Wall Clock for rotator cuff strength   RUE: 2 x 10 - 1 oclock, 3oclock 5oclock   Seated R Shoulder ER - Abducted to 90 deg supported by bolster   3 x 10 - 5# DB   Physical Performance Measure(2 min unbilled):  UE MMT: See below at goals - Discussed results with patient     PATIENT EDUCATION:  Education details: Exercise Technique  Person educated: Patient Education method: Explanation, Demonstration, and Handouts Education comprehension: verbalized understanding and returned demonstration   HOME EXERCISE PROGRAM:  Access Code: [email or web address] URL: [email or web address] Date: 10/05/2024 Prepared by: Lonni Pall  Exercises - Seated Scapular Retraction  - 1-2 x  daily - 3-4 x weekly - 2-3 sets - 10-12 reps - Sleeper Stretch  - 1 x daily - 7 x weekly - 3 sets - 30s hold - Doorway Pec Stretch at 90 Degrees Abduction  - 1 x daily - 7 x weekly - 3 sets - 30s hold - Standing Shoulder Posterior Capsule Stretch  - 1 x daily - 7 x weekly - 3 sets - 30s hold - Standing Isometric Shoulder External Rotation with Doorway  - 1 x daily -  7 x weekly - 3-5 sets - 10s hold - Standing Shoulder Row with Anchored Resistance  - 1 x daily - 3-4 x weekly - 2-3 sets - 10-12 reps - Shoulder External Rotation with Anchored Resistance with Towel Under Elbow  - 1 x daily - 3-4 x weekly - 2-3 sets - 10-12 reps - Shoulder External Rotation and Scapular Retraction with Resistance  - 1-2 x daily - 3-4 x weekly - 2-3 sets - 10-12 reps - Shoulder Internal Rotation with Resistance  - 1 x daily - 3-4 x weekly - 2-3 sets - 10-12 reps - Sidelying Shoulder Horizontal Abduction  - 1 x daily - 3-4 x weekly - 2-3 sets - 10-12 reps - Seated Shoulder External Rotation in Abduction Supported with Dumbbell  - 1 x daily - 3-4 x weekly - 2-3 sets - 10-12 reps  Access Code: [email or web address] URL: [email or web address] Date: 09/23/2024 Prepared by: Lonni Pall  Exercises - Seated Scapular Retraction  - 1-2 x daily - 3-4 x weekly - 2-3 sets - 10-12 reps - Sleeper Stretch  - 1 x daily - 7 x weekly - 3 sets - 30s hold - Doorway Pec Stretch at 90 Degrees Abduction  - 1 x daily - 7 x weekly - 3 sets - 30s hold - Standing Shoulder Posterior Capsule Stretch  - 1 x daily - 7 x weekly - 3 sets - 30s hold - Standing Isometric Shoulder External Rotation with Doorway  - 1 x daily - 7 x weekly - 3-5 sets - 10s hold - Standing Shoulder Row with Anchored Resistance  - 1 x daily - 3-4 x weekly - 2-3 sets - 10-12 reps - Shoulder External Rotation with Anchored Resistance with Towel Under Elbow  - 1 x daily - 3-4 x weekly - 2-3 sets - 10-12 reps - Shoulder External Rotation and Scapular Retraction with  Resistance  - 1-2 x daily - 3-4 x weekly - 2-3 sets - 10-12 reps - Shoulder Internal Rotation with Resistance  - 1 x daily - 3-4 x weekly - 2-3 sets - 10-12 reps - Sidelying Shoulder Horizontal Abduction  - 1 x daily - 3-4 x weekly - 2-3 sets - 10-12 reps  Access Code: [email or web address] URL: [email or web address] Date: 09/01/2024 Prepared by: Lonni Pall  Exercises - Standing Isometric Shoulder External Rotation with Doorway  - 1 x daily - 7 x weekly - 3-5 sets - 10s hold - Seated Scapular Retraction  - 1-2 x daily - 3-4 x weekly - 2-3 sets - 10-12 reps - Shoulder External Rotation and Scapular Retraction with Resistance  - 1-2 x daily - 3-4 x weekly - 2-3 sets - 10-12 reps  ASSESSMENT:  CLINICAL IMPRESSION: Patient arrives to OPPT for 10th visit warranting a progress note towards PT goals. Patient demosntrates improvements towards range of motion, strength and pain in the R shoulder (see goals below). He has been very adherent to HEP and continues to be motivated to improve RUE function. Per self report on Quick DASH patient still has minimal disability due to RUE pain however he can complete all ADLs. He is making good progress towards PT goals and patient endorsed early discharge pending continued progress. He reports that the only pain in the RUE is localized to the biceps muscle region but very minimal. Remainder of the session with continued focus on CKC/OKC strengthening for improved shoulder stability. PT plans to continue strengthening in [facility] for rotator cuff group in order to improve OH movements/lifting pain. Based  on today's performance, pt will continue to benefit from skilled PT in order to facilitate return to PLOF and improve QoL.   OBJECTIVE IMPAIRMENTS: decreased activity tolerance, decreased ROM, decreased strength, and pain.   ACTIVITY LIMITATIONS: carrying, lifting, sleeping, and reach over head  PARTICIPATION LIMITATIONS: yard work  PERSONAL FACTORS: Age, Time  since onset of injury/illness/exacerbation, and 3+ comorbidities: Hx of NSTEMI, HTN, CHF are also affecting patient's functional outcome.   REHAB POTENTIAL: Good  CLINICAL DECISION MAKING: Evolving/moderate complexity  EVALUATION COMPLEXITY: Moderate   GOALS: Goals reviewed with patient? Yes  SHORT TERM GOALS: Target date: 09/29/2024  Pt will be independent with HEP to improve strength and decrease shoulder pain to improve pain-free function at home and work. Baseline: 09/01/2024: Initial HEP given; 10/12/2024: 85% adherence Goal status: Progressing   LONG TERM GOALS: Target date: 10/27/2024  Pt will increase R shoulder flexion to 140 degrees without pain in order to demonstrate limb symmetry and significantly improvements in ROM/pain for overhead ADLs. Baseline: R: 130 deg; R:145 deg Goal status: Progressing   2.  Pt will decrease worst shoulder pain by at least 3 points on the NPRS in order to demonstrate clinically significant reduction in shoulder pain. Baseline: 09/01/2024: 7/10 NPS; 10/12/2024: 3/10 Goal status: Progressing  3.  Pt will decrease quick DASH score by at least 8% in order to demonstrate clinically significant reduction in disability related to shoulder pain        Baseline: 09/01/2024: 9.1%/100 (Minimal Disability); 10/12/2024: 6.8 / 100 = 6.8 % Goal status: Progressing   4. Pt will increase strength to a 4+ MMT grade with R shoulder flexion, adb, and external rotation in order to demonstrate improvement in strength and function         Baseline: 09/01/2024:  Shoulder  R/L  10/12/24 R/L   Flexion 3+* 4+ 4/4+  Extension     Abduction 4-* 4+ 4/4+  External rotation 4-* 5 4+/5   Goal status: Progressing   5.  Pt will achieve 6-8 hours of sleep per night without report of awakening from pain in order to demonstrate significant improvements in pain.  Baseline: 09/01/2024: Patient wakes multiple times per night due to pain; 10/12/2024: Patient wakes up once  or twice  Goal status: Progressing    PLAN: PT FREQUENCY: 1-2x/week  PT DURATION: 8 weeks  PLANNED INTERVENTIONS: Therapeutic exercises, Therapeutic activity, Neuromuscular re-education, Balance training, Gait training, Patient/Family education, Self Care, Joint mobilization, Joint manipulation, Vestibular training, Canalith repositioning, Orthotic/Fit training, DME instructions, Dry Needling, Electrical stimulation, Spinal manipulation, Spinal mobilization, Cryotherapy, Moist heat, Taping, Traction, Ultrasound, Ionotophoresis 4mg /ml Dexamethasone, Manual therapy, and Re-evaluation.  PLAN FOR NEXT SESSION: Progress RC strengthening, external rotation focus, Isometrics, review goals   Lonni Pall PT, DPT Physical Therapist- [place]  10/12/2024, 1:41 PM

## 2024-10-14 ENCOUNTER — Ambulatory Visit

## 2024-10-14 DIAGNOSIS — G8929 Other chronic pain: Secondary | ICD-10-CM

## 2024-10-14 DIAGNOSIS — M6281 Muscle weakness (generalized): Secondary | ICD-10-CM

## 2024-10-14 DIAGNOSIS — M25511 Pain in right shoulder: Secondary | ICD-10-CM | POA: Diagnosis not present

## 2024-10-14 NOTE — Therapy (Signed)
 1 OUTPATIENT PHYSICAL THERAPY SHOULDER/ELBOW TREATMENT   Patient Name: Xavier Davis MRN: [id number] DOB:October 15, 1941, 83 y.o., male Today's Date: 10/14/2024  END OF SESSION:  PT End of Session - 10/14/24 1032     Visit Number 11    Number of Visits 17    Date for Recertification  10/27/24    PT Start Time 1030    PT Stop Time 1110    PT Time Calculation (min) 40 min    Activity Tolerance Patient tolerated treatment well    Behavior During Therapy [name] for tasks assessed/performed               Past Medical History:  Diagnosis Date   Anxiety    Atrial fibrillation (HCC)    BPH (benign prostatic hyperplasia)    Carotid artery disease    s/p left carotid endarterectomy at [facility] in 2014   Carotid stenosis    bilateral   CKD (chronic kidney disease)    Elevated PSA    Fracture, thoracic vertebra (HCC) 05/06/2018   T3,T9 endplate fracture   Hemothorax on left 05/06/2018   HLD (hyperlipidemia)    HTN (hypertension)    Hyperlipidemia    Hypertension    Insomnia    Rib fractures 05/06/2018   Weak urinary stream    Past Surgical History:  Procedure Laterality Date   APPENDECTOMY     CAROTID PTA/STENT INTERVENTION Right 02/26/2022   Procedure: CAROTID PTA/STENT INTERVENTION;  Surgeon: Marea Selinda RAMAN, MD;  Location: [facility] INVASIVE CV LAB;  Service: Cardiovascular;  Laterality: Right;   carotid stenosis Left    ces and stent placement   cataract surgery Bilateral    Patient Active Problem List   Diagnosis Date Noted   Congestive heart failure (HCC) 06/29/2022   Encounter for therapeutic drug level monitoring 06/29/2022   Erectile dysfunction 06/29/2022   History of anemia 06/29/2022   Hypertensive heart and chronic kidney disease with heart failure and stage 1 through stage 4 chronic kidney disease, or unspecified chronic kidney disease (HCC) 06/29/2022   Carotid artery stenosis 06/29/2022   Chronic kidney disease 06/29/2022   Atrial fibrillation (HCC) 06/29/2022    Unspecified atrial fibrillation (HCC) 06/29/2022   Bilateral leg edema 05/03/2022   Bradycardia 05/03/2022   Acute on chronic diastolic CHF (congestive heart failure) (HCC) 03/01/2022   NSTEMI (non-ST elevated myocardial infarction) (HCC) 02/28/2022   Carotid stenosis, right 02/26/2022   Abnormal ECG 01/15/2022   Combined forms of age-related cataract, bilateral 12/26/2021   Benign neoplasm of colon 01/13/2020   Elevated prostate specific antigen (PSA) 01/13/2020   Insomnia 01/13/2020   Other ill-defined and unknown causes of morbidity and mortality 01/13/2020   Psychosexual dysfunction with inhibited sexual excitement 01/13/2020   Status post vasectomy 01/13/2020   DDD (degenerative disc disease), lumbar 07/30/2018   Anxiety 07/28/2018   Carotid stenosis 07/28/2018   CKD (chronic kidney disease) stage 3, GFR 30-59 ml/min 07/28/2018   Elevated blood sugar 07/28/2018   Occlusion and stenosis of carotid artery 07/28/2018   Paroxysmal atrial fibrillation (HCC) 07/03/2018   Leg swelling 07/03/2018   Paroxysmal A-fib (HCC) 07/03/2018   Fall from ladder 04/16/2018   Bilateral carotid artery stenosis 10/23/2016   Hypertension 10/23/2016   Hyperlipidemia 10/23/2016   Incomplete bladder emptying 07/02/2015   Benign prostatic hyperplasia 06/23/2015    PCP:  Sadie Manna, MD      REFERRING PROVIDER:  Kathlynn Sharper, MD      REFERRING DIAG:  S46.011A (ICD-10-CM) - Strain of  muscle(s) and tendon(s) of the rotator cuff of right shoulder, initial encounter    RATIONALE FOR EVALUATION AND TREATMENT: Rehabilitation  THERAPY DIAG: Chronic right shoulder pain  Muscle weakness (generalized)  ONSET DATE: 6 mos  FOLLOW-UP APPT SCHEDULED WITH REFERRING PROVIDER: No    SUBJECTIVE:                                                                                                                                                                                         SUBJECTIVE  STATEMENT:    Patient reports to OPPT with R shoulder pain.   PERTINENT HISTORY:   Xavier Davis is a 83 y.o. male presenting with R shoulder pain. 6 mos ago patient reports he was pulling on a tree limb and he felt a sudden shock in his R arm. Patient reports that the pain disrupts his sleep cycle throughout the night. Additionally he reports that it painful to lift his arm past 90 degrees. Patient tried ice initially with minimal relief. Patient still continues to use RUE to perform ADLs around the house although pain is present   Patient denies chills/fever, night sweats, nausea  Hand Dominance: R Hand   Imaging: (Per Chart Review 08/24/24):  Dr. Ozell Fairy [place]ake: X-rays conducted on August 12, 2024, revealed moderate AC degenerative changes, joint space narrowing, and some inferior spurring of the glenohumeral joint, as well as AC arthritis.   PAIN:  Pain Intensity: Present: 1/10, Best: 0/10, Worst: 6-7/10 Pain location: R Shoulder  Pain Quality: constant and aching  Radiating: No  Numbness/Tingling: No History of prior shoulder or neck/shoulder injury, pain, surgery, or therapy: No  PRECAUTIONS: None  WEIGHT BEARING RESTRICTIONS: No  FALLS: Has patient fallen in last 6 months? No  Living Environment Lives with: lives with their spouse Lives in: House/apartment Stairs: No Has following equipment at home: None  Prior level of function: Independent  Occupational demands: Retired  Hobbies: Outdoors, Gardening  Patient Goals: Be able to lift my arm without pain and reduce the aching at night.     OBJECTIVE:   Patient Surveys  QUICK DASH  Please rate your ability do the following activities in the last week by selecting the number below the appropriate response.   Activities Rating  Open a tight or new jar.  1 = No difficulty   Do heavy household chores (e.g., wash walls, floors). 1 = No difficulty   Carry a shopping bag or briefcase 1 = No difficulty    Wash your back. 1 = No difficulty   Use a knife to cut food. 1 = No difficulty   Recreational activities in which  you take some force or impact through your arm, shoulder or hand (e.g., golf, hammering, tennis, etc.). 2 = Mild difficulty  During the past week, to what extent has your arm, shoulder or hand problem interfered with your normal social activities with family, friends, neighbors or groups?  1 = Not at all  During the past week, were you limited in your work or other regular daily activities as a result of your arm, shoulder or hand problem? 2 = Slightly limited  Rate the severity of the following symptoms in the last week: Arm, Shoulder, or hand pain. 2 = Mild  Rate the severity of the following symptoms in the last week: Tingling (pins and needles) in your arm, shoulder or hand. 1 = none  During the past week, how much difficulty have you had sleeping because of the pain in your arm, shoulder or hand?  2 = Mild difficulty   (A QuickDASH score may not be calculated if there is greater than 1 missing item.)  Quick Dash Disability/Symptom Score: 9.1%  Minimally Clinically Important Difference (MCID): 15-20 points  Flavio, F. et al. (2013). Minimally clinically important difference of the disabilities of the arm, shoulder, and hand outcome measures (DASH) and its shortened version (Quick DASH). Journal of Orthopaedic & Sports Physical Therapy, 44(1), 30-39)   Cognition Patient is oriented to person, place, and time.  Recent memory is intact.  Remote memory is intact.  Attention span and concentration are intact.  Expressive speech is intact.  Patient's fund of knowledge is within normal limits for educational level.    Gross Musculoskeletal Assessment Tremor: None Bulk: Normal Tone: Normal  Gait   Posture   Cervical Screen AROM: WFL and painless with overpressure in all planes Spurlings A (ipsilateral lateral flexion/axial compression): R: Negative     AROM AROM (Normal range in degrees) AROM  Cervical  Flexion (50)   Extension (80)   Right lateral flexion (45)   Left lateral flexion (45)   Right rotation (85)   Left rotation (85)    Right Left  Shoulder    Flexion 130 140  Extension    Abduction [facility]  WFL   External Rotation WFL* WFL   Internal Rotation    Hands Behind Head [facility] WFL  Hands Behind Back [facility] WFL      Elbow    Flexion    Extension    Pronation    Supination    (* = pain; Blank rows = not tested)  UE MMT: MMT (out of 5) Right Left   Cervical (isometric)  Flexion WNL  Extension WNL  Lateral Flexion WNL WNL  Rotation WNL WNL      Shoulder   Flexion 3+* 4+  Extension    Abduction 4-* 4+  External rotation 4-* 5  Internal rotation 5 5  Horizontal abduction    Horizontal adduction    Lower Trapezius    Rhomboids        Elbow  Flexion 5 5  Extension 5 5  Pronation    Supination        Wrist  Flexion    Extension    Radial deviation    Ulnar deviation        MCP  Flexion    Extension    Abduction    Adduction    (* = pain; Blank rows = not tested)  Sensation Grossly intact to light touch bilateral UE as determined by testing dermatomes C2-T2. Proprioception and hot/cold testing  deferred on this date.  Reflexes Deferred   Palpation Location LEFT  RIGHT           Subocciptials    Cervical paraspinals    Upper Trapezius  0  Levator Scapulae  0  Rhomboid Major/Minor    Sternoclavicular joint    Acromioclavicular joint    Coracoid process    Long head of biceps    Supraspinatus  0  Infraspinatus  0  Subscapularis  0  Teres Minor    Teres Major    Pectoralis Major    Pectoralis Minor    Anterior Deltoid    Lateral Deltoid    Posterior Deltoid    Latissimus Dorsi    Sternocleidomastoid    (Blank rows = not tested) Graded on 0-4 scale (0 = no pain, 1 = pain, 2 = pain with wincing/grimacing/flinching, 3 = pain with withdrawal, 4 = unwilling to allow palpation), (Blank  rows = not tested)   Passive Accessory Intervertebral Motion Deferred  SPECIAL TESTS Rotator Cuff  Drop Arm Test: Negative Painful Arc (Pain from 60 to 120 degrees scaption): Positive Infraspinatus Muscle Test: Positive If all 3 tests positive, the probability of a full-thickness rotator cuff tear is 91%  TODAY'S TREATMENT  DATE: 10/14/24  Subjective: Patient reports 0/10 pain in the R shoulder/upper arm. He reports that he was able to wake up this morning without and pain in his biceps. No further questions or concerns at start of session.   Therapeutic Activity (focused on improving shoulder strength in order to optimize raking, lifting and yard work such as sweeping):  UBE - 5 min - 2.5 min Fwd, 2.5 min Retro - Level 14-12 for UE  strength and muscular endurance; PT manually adjusted resistance throughout to patient's tolerance.  Standing Curl (Weighted Dowel - 6#)   1 x 10 - WD   1 x 10 - 5# added   1 x 10 - 10# added   Reverse Bosu Push Ups (on elevated mat table)   2 x 10 reps   Shoulder Flexion with Supinated Grip   3 x 10# Plate   Therapeutic Exercise (with intent to strengthen rotator cuff and shoulder muscles to address muscle weakness):    Standing Wall Clock for rotator cuff strength   RUE: 2 x 10 - 1 oclock, 3oclock 5oclock   Standing ER - Abd at 90 deg    R: 3 x 10 - YTB    Standing Lat Pull Down     3 x 10 - Blue TB    Standing Scapular Row    3  x 10 - Grey TB    Reviewed updates to HEP and addressed frequency/sets/reps for HEP.   PATIENT EDUCATION:  Education details: Exercise Technique  Person educated: Patient Education method: Explanation, Demonstration, and Handouts Education comprehension: verbalized understanding and returned demonstration   HOME EXERCISE PROGRAM:  Access Code: [email or web address] URL: [email or web address] Date: 10/14/2024 Prepared by: Lonni Pall  Exercises - Sleeper Stretch  - 1 x daily - 7 x weekly - 3  sets - 30s hold - Doorway Pec Stretch at 90 Degrees Abduction  - 1 x daily - 7 x weekly - 3 sets - 30s hold - Standing Shoulder Posterior Capsule Stretch  - 1 x daily - 7 x weekly - 3 sets - 30s hold - Standing Shoulder Row with Anchored Resistance  - 1 x daily - 3-4 x weekly - 2-3 sets - 10-12 reps - Shoulder External Rotation with Anchored  Resistance with Towel Under Elbow  - 1 x daily - 3-4 x weekly - 2-3 sets - 10-12 reps - Shoulder External Rotation and Scapular Retraction with Resistance  - 1-2 x daily - 3-4 x weekly - 2-3 sets - 10-12 reps - Shoulder Internal Rotation with Resistance  - 1 x daily - 3-4 x weekly - 2-3 sets - 10-12 reps - Sidelying Shoulder Horizontal Abduction  - 1 x daily - 3-4 x weekly - 2-3 sets - 10-12 reps - Seated Shoulder External Rotation in Abduction Supported with Dumbbell  - 1 x daily - 3-4 x weekly - 2-3 sets - 10-12 reps - Standing Shoulder Horizontal Abduction with Resistance  - 1 x daily - 3-4 x weekly - 2-3 sets - 10 reps - Single Arm Shoulder Extension with Anchored Resistance  - 1 x daily - 3-4 x weekly - 2-3 sets - 10 reps  Access Code: [email or web address] URL: [email or web address] Date: 10/05/2024 Prepared by: Lonni Pall  Exercises - Seated Scapular Retraction  - 1-2 x daily - 3-4 x weekly - 2-3 sets - 10-12 reps - Sleeper Stretch  - 1 x daily - 7 x weekly - 3 sets - 30s hold - Doorway Pec Stretch at 90 Degrees Abduction  - 1 x daily - 7 x weekly - 3 sets - 30s hold - Standing Shoulder Posterior Capsule Stretch  - 1 x daily - 7 x weekly - 3 sets - 30s hold - Standing Isometric Shoulder External Rotation with Doorway  - 1 x daily - 7 x weekly - 3-5 sets - 10s hold - Standing Shoulder Row with Anchored Resistance  - 1 x daily - 3-4 x weekly - 2-3 sets - 10-12 reps - Shoulder External Rotation with Anchored Resistance with Towel Under Elbow  - 1 x daily - 3-4 x weekly - 2-3 sets - 10-12 reps - Shoulder External Rotation and Scapular Retraction  with Resistance  - 1-2 x daily - 3-4 x weekly - 2-3 sets - 10-12 reps - Shoulder Internal Rotation with Resistance  - 1 x daily - 3-4 x weekly - 2-3 sets - 10-12 reps - Sidelying Shoulder Horizontal Abduction  - 1 x daily - 3-4 x weekly - 2-3 sets - 10-12 reps - Seated Shoulder External Rotation in Abduction Supported with Dumbbell  - 1 x daily - 3-4 x weekly - 2-3 sets - 10-12 reps  ASSESSMENT:  CLINICAL IMPRESSION: Patient returned to OPPT in management of R shoulder pain secondary to RC weakness. PT focused on continued rotator cuff and total shoulder strengthening to reduce further injury. Good carryover from previous session to perform CKC stability in push up position with reverse bosu; no VC for technique required. OKC stability exercise with TRX ropes; patient with good ability to maintain balance and perform row with good scapular retraction. Patient's pain continues to improve with progressive loading and activity modification at home. He reports improvements with sleeping since pain has reduced. Intermittent standing rest breaks provided in order to mitigate cardiovascular fatigue. PT plans to continue strengthening in OKC/CKC for rotator cuff group in order to improve OH movements/lifting pain. Based on today's performance, pt will continue to benefit from skilled PT in order to facilitate return to PLOF and improve QoL.     OBJECTIVE IMPAIRMENTS: decreased activity tolerance, decreased ROM, decreased strength, and pain.   ACTIVITY LIMITATIONS: carrying, lifting, sleeping, and reach over head  PARTICIPATION LIMITATIONS: yard work  PERSONAL FACTORS: Age, Time since onset of injury/illness/exacerbation, and  3+ comorbidities: Hx of NSTEMI, HTN, CHF are also affecting patient's functional outcome.   REHAB POTENTIAL: Good  CLINICAL DECISION MAKING: Evolving/moderate complexity  EVALUATION COMPLEXITY: Moderate   GOALS: Goals reviewed with patient? Yes  SHORT TERM GOALS: Target  date: 09/29/2024  Pt will be independent with HEP to improve strength and decrease shoulder pain to improve pain-free function at home and work. Baseline: 09/01/2024: Initial HEP given; 10/12/2024: 85% adherence Goal status: Progressing   LONG TERM GOALS: Target date: 10/27/2024  Pt will increase R shoulder flexion to 140 degrees without pain in order to demonstrate limb symmetry and significantly improvements in ROM/pain for overhead ADLs. Baseline: R: 130 deg; R:145 deg Goal status: Progressing   2.  Pt will decrease worst shoulder pain by at least 3 points on the NPRS in order to demonstrate clinically significant reduction in shoulder pain. Baseline: 09/01/2024: 7/10 NPS; 10/12/2024: 3/10 Goal status: Progressing  3.  Pt will decrease quick DASH score by at least 8% in order to demonstrate clinically significant reduction in disability related to shoulder pain        Baseline: 09/01/2024: 9.1%/100 (Minimal Disability); 10/12/2024: 6.8 / 100 = 6.8 % Goal status: Progressing   4. Pt will increase strength to a 4+ MMT grade with R shoulder flexion, adb, and external rotation in order to demonstrate improvement in strength and function         Baseline: 09/01/2024:  Shoulder  R/L  10/12/24 R/L   Flexion 3+* 4+ 4/4+  Extension     Abduction 4-* 4+ 4/4+  External rotation 4-* 5 4+/5   Goal status: Progressing   5.  Pt will achieve 6-8 hours of sleep per night without report of awakening from pain in order to demonstrate significant improvements in pain.  Baseline: 09/01/2024: Patient wakes multiple times per night due to pain; 10/12/2024: Patient wakes up once or twice  Goal status: Progressing    PLAN: PT FREQUENCY: 1-2x/week  PT DURATION: 8 weeks  PLANNED INTERVENTIONS: Therapeutic exercises, Therapeutic activity, Neuromuscular re-education, Balance training, Gait training, Patient/Family education, Self Care, Joint mobilization, Joint manipulation, Vestibular training,  Canalith repositioning, Orthotic/Fit training, DME instructions, Dry Needling, Electrical stimulation, Spinal manipulation, Spinal mobilization, Cryotherapy, Moist heat, Taping, Traction, Ultrasound, Ionotophoresis 4mg /ml Dexamethasone, Manual therapy, and Re-evaluation.  PLAN FOR NEXT SESSION: Progress RC strengthening, external rotation focus, Isometrics, review goals   Lonni Pall PT, DPT Physical Therapist- [place]  10/14/2024, 10:34 AM

## 2024-10-19 ENCOUNTER — Ambulatory Visit

## 2024-10-19 DIAGNOSIS — M25511 Pain in right shoulder: Secondary | ICD-10-CM | POA: Diagnosis not present

## 2024-10-19 DIAGNOSIS — G8929 Other chronic pain: Secondary | ICD-10-CM

## 2024-10-19 DIAGNOSIS — M6281 Muscle weakness (generalized): Secondary | ICD-10-CM

## 2024-10-19 NOTE — Therapy (Signed)
 1 OUTPATIENT PHYSICAL THERAPY SHOULDER/ELBOW TREATMENT   Patient Name: Xavier Davis MRN: [id number] DOB:05/05/1941, 83 y.o., male Today's Date: 10/19/2024  END OF SESSION:  PT End of Session - 10/19/24 1441     Visit Number 12    Number of Visits 17    Date for Recertification  10/27/24    PT Start Time 1431    PT Stop Time 1515    PT Time Calculation (min) 44 min    Activity Tolerance Patient tolerated treatment well    Behavior During Therapy Xavier Davis for tasks assessed/performed               Past Medical History:  Diagnosis Date   Anxiety    Atrial fibrillation (HCC)    BPH (benign prostatic hyperplasia)    Carotid artery disease    s/p left carotid endarterectomy at [facility] in 2014   Carotid stenosis    bilateral   CKD (chronic kidney disease)    Elevated PSA    Fracture, thoracic vertebra (HCC) 05/06/2018   T3,T9 endplate fracture   Hemothorax on left 05/06/2018   HLD (hyperlipidemia)    HTN (hypertension)    Hyperlipidemia    Hypertension    Insomnia    Rib fractures 05/06/2018   Weak urinary stream    Past Surgical History:  Procedure Laterality Date   APPENDECTOMY     CAROTID PTA/STENT INTERVENTION Right 02/26/2022   Procedure: CAROTID PTA/STENT INTERVENTION;  Surgeon: Marea Selinda RAMAN, MD;  Location: [facility] INVASIVE CV LAB;  Service: Cardiovascular;  Laterality: Right;   carotid stenosis Left    ces and stent placement   cataract surgery Bilateral    Patient Active Problem List   Diagnosis Date Noted   Congestive heart failure (HCC) 06/29/2022   Encounter for therapeutic drug level monitoring 06/29/2022   Erectile dysfunction 06/29/2022   History of anemia 06/29/2022   Hypertensive heart and chronic kidney disease with heart failure and stage 1 through stage 4 chronic kidney disease, or unspecified chronic kidney disease (HCC) 06/29/2022   Carotid artery stenosis 06/29/2022   Chronic kidney disease 06/29/2022   Atrial fibrillation (HCC) 06/29/2022    Unspecified atrial fibrillation (HCC) 06/29/2022   Bilateral leg edema 05/03/2022   Bradycardia 05/03/2022   Acute on chronic diastolic CHF (congestive heart failure) (HCC) 03/01/2022   NSTEMI (non-ST elevated myocardial infarction) (HCC) 02/28/2022   Carotid stenosis, right 02/26/2022   Abnormal ECG 01/15/2022   Combined forms of age-related cataract, bilateral 12/26/2021   Benign neoplasm of colon 01/13/2020   Elevated prostate specific antigen (PSA) 01/13/2020   Insomnia 01/13/2020   Other ill-defined and unknown causes of morbidity and mortality 01/13/2020   Psychosexual dysfunction with inhibited sexual excitement 01/13/2020   Status post vasectomy 01/13/2020   DDD (degenerative disc disease), lumbar 07/30/2018   Anxiety 07/28/2018   Carotid stenosis 07/28/2018   CKD (chronic kidney disease) stage 3, GFR 30-59 ml/min 07/28/2018   Elevated blood sugar 07/28/2018   Occlusion and stenosis of carotid artery 07/28/2018   Paroxysmal atrial fibrillation (HCC) 07/03/2018   Leg swelling 07/03/2018   Paroxysmal A-fib (HCC) 07/03/2018   Fall from ladder 04/16/2018   Bilateral carotid artery stenosis 10/23/2016   Hypertension 10/23/2016   Hyperlipidemia 10/23/2016   Incomplete bladder emptying 07/02/2015   Benign prostatic hyperplasia 06/23/2015    PCP:  Sadie Manna, MD      REFERRING PROVIDER:  Kathlynn Sharper, MD      REFERRING DIAG:  S46.011A (ICD-10-CM) - Strain of  muscle(s) and tendon(s) of the rotator cuff of right shoulder, initial encounter    RATIONALE FOR EVALUATION AND TREATMENT: Rehabilitation  THERAPY DIAG: Chronic right shoulder pain  Muscle weakness (generalized)  ONSET DATE: 6 mos  FOLLOW-UP APPT SCHEDULED WITH REFERRING PROVIDER: No    SUBJECTIVE:                                                                                                                                                                                         SUBJECTIVE  STATEMENT:    Patient reports to OPPT with R shoulder pain.   PERTINENT HISTORY:   [name] is a 83 y.o. male presenting with R shoulder pain. 6 mos ago patient reports he was pulling on a tree limb and he felt a sudden shock in his R arm. Patient reports that the pain disrupts his sleep cycle throughout the night. Additionally he reports that it painful to lift his arm past 90 degrees. Patient tried ice initially with minimal relief. Patient still continues to use RUE to perform ADLs around the house although pain is present   Patient denies chills/fever, night sweats, nausea  Hand Dominance: R Hand   Imaging: (Per Chart Review 08/24/24):  Dr. Ozell Fairy [place]ake: X-rays conducted on August 12, 2024, revealed moderate AC degenerative changes, joint space narrowing, and some inferior spurring of the glenohumeral joint, as well as AC arthritis.   PAIN:  Pain Intensity: Present: 1/10, Best: 0/10, Worst: 6-7/10 Pain location: R Shoulder  Pain Quality: constant and aching  Radiating: No  Numbness/Tingling: No History of prior shoulder or neck/shoulder injury, pain, surgery, or therapy: No  PRECAUTIONS: None  WEIGHT BEARING RESTRICTIONS: No  FALLS: Has patient fallen in last 6 months? No  Living Environment Lives with: lives with their spouse Lives in: House/apartment Stairs: No Has following equipment at home: None  Prior level of function: Independent  Occupational demands: Retired  Hobbies: Outdoors, Gardening  Patient Goals: Be able to lift my arm without pain and reduce the aching at night.     OBJECTIVE:   Patient Surveys  QUICK DASH  Please rate your ability do the following activities in the last week by selecting the number below the appropriate response.   Activities Rating  Open a tight or new jar.  1 = No difficulty   Do heavy household chores (e.g., wash walls, floors). 1 = No difficulty   Carry a shopping bag or briefcase 1 = No difficulty    Wash your back. 1 = No difficulty   Use a knife to cut food. 1 = No difficulty   Recreational activities in which  you take some force or impact through your arm, shoulder or hand (e.g., golf, hammering, tennis, etc.). 2 = Mild difficulty  During the past week, to what extent has your arm, shoulder or hand problem interfered with your normal social activities with family, friends, neighbors or groups?  1 = Not at all  During the past week, were you limited in your work or other regular daily activities as a result of your arm, shoulder or hand problem? 2 = Slightly limited  Rate the severity of the following symptoms in the last week: Arm, Shoulder, or hand pain. 2 = Mild  Rate the severity of the following symptoms in the last week: Tingling (pins and needles) in your arm, shoulder or hand. 1 = none  During the past week, how much difficulty have you had sleeping because of the pain in your arm, shoulder or hand?  2 = Mild difficulty   (A QuickDASH score may not be calculated if there is greater than 1 missing item.)  Quick Dash Disability/Symptom Score: 9.1%  Minimally Clinically Important Difference (MCID): 15-20 points  Flavio, F. et al. (2013). Minimally clinically important difference of the disabilities of the arm, shoulder, and hand outcome measures (DASH) and its shortened version (Quick DASH). Journal of Orthopaedic & Sports Physical Therapy, 44(1), 30-39)   Cognition Patient is oriented to person, place, and time.  Recent memory is intact.  Remote memory is intact.  Attention span and concentration are intact.  Expressive speech is intact.  Patient's fund of knowledge is within normal limits for educational level.    Gross Musculoskeletal Assessment Tremor: None Bulk: Normal Tone: Normal  Gait   Posture   Cervical Screen AROM: WFL and painless with overpressure in all planes Spurlings A (ipsilateral lateral flexion/axial compression): R: Negative     AROM AROM (Normal range in degrees) AROM  Cervical  Flexion (50)   Extension (80)   Right lateral flexion (45)   Left lateral flexion (45)   Right rotation (85)   Left rotation (85)    Right Left  Shoulder    Flexion 130 140  Extension    Abduction [facility]  WFL   External Rotation WFL* WFL   Internal Rotation    Hands Behind Head [facility] WFL  Hands Behind Back [facility] WFL      Elbow    Flexion    Extension    Pronation    Supination    (* = pain; Blank rows = not tested)  UE MMT: MMT (out of 5) Right Left   Cervical (isometric)  Flexion WNL  Extension WNL  Lateral Flexion WNL WNL  Rotation WNL WNL      Shoulder   Flexion 3+* 4+  Extension    Abduction 4-* 4+  External rotation 4-* 5  Internal rotation 5 5  Horizontal abduction    Horizontal adduction    Lower Trapezius    Rhomboids        Elbow  Flexion 5 5  Extension 5 5  Pronation    Supination        Wrist  Flexion    Extension    Radial deviation    Ulnar deviation        MCP  Flexion    Extension    Abduction    Adduction    (* = pain; Blank rows = not tested)  Sensation Grossly intact to light touch bilateral UE as determined by testing dermatomes C2-T2. Proprioception and hot/cold testing  deferred on this date.  Reflexes Deferred   Palpation Location LEFT  RIGHT           Subocciptials    Cervical paraspinals    Upper Trapezius  0  Levator Scapulae  0  Rhomboid Major/Minor    Sternoclavicular joint    Acromioclavicular joint    Coracoid process    Long head of biceps    Supraspinatus  0  Infraspinatus  0  Subscapularis  0  Teres Minor    Teres Major    Pectoralis Major    Pectoralis Minor    Anterior Deltoid    Lateral Deltoid    Posterior Deltoid    Latissimus Dorsi    Sternocleidomastoid    (Blank rows = not tested) Graded on 0-4 scale (0 = no pain, 1 = pain, 2 = pain with wincing/grimacing/flinching, 3 = pain with withdrawal, 4 = unwilling to allow palpation), (Blank  rows = not tested)   Passive Accessory Intervertebral Motion Deferred  SPECIAL TESTS Rotator Cuff  Drop Arm Test: Negative Painful Arc (Pain from 60 to 120 degrees scaption): Positive Infraspinatus Muscle Test: Positive If all 3 tests positive, the probability of a full-thickness rotator cuff tear is 91%  TODAY'S TREATMENT  DATE: 10/19/2024  Subjective: Patient reports there is no pain in the R shoulder at rest at start of the session. Intermittent pain in the R bicep muscle. No further questions or concerns at start of session.   Therapeutic Activity (focused on improving shoulder strength in order to optimize raking, lifting and yard work such as sweeping):  UBE - 5 min - 2.5 min Fwd, 2.5 min Retro - Level 14-12 for UE  strength and muscular endurance; PT manually adjusted resistance throughout to patient's tolerance.  Seated Land mine press with 25# barbell  1 x 10   2 x 10 - 10# plate added   Reverse Bosu Push Ups (on elevated mat table)             1 x 10 reps  1 x 12 reps   Waiters Carry with 7# DB   39m x 2  Therapeutic Exercise (with intent to strengthen rotator cuff and shoulder muscles to address muscle weakness):     Seated Scapular Row                        1 x 10 - 15#                        2 x 10 - 25#                                       Lat Pull Down biceps                        1 x 10 - 25# 2 x 10 - 30#    Standing ER - Abd at 90 deg    R: 3 x 10 - YTB    Multimodal cues for proper technique    Seated R GHJ ER - Abd at 90 deg on blue bolster    3 x 10 - 3# DB   Seated R GHJ IR - Abd at 90 deg on blue bolster    3 x 10 - Red TB   PATIENT EDUCATION:  Education  details: Exercise Technique  Person educated: Patient Education method: Explanation, Demonstration, and Handouts Education comprehension: verbalized understanding and returned demonstration   HOME EXERCISE PROGRAM:  Access Code: [email or web address] URL:  [email or web address] Date: 10/14/2024 Prepared by: Lonni Pall  Exercises - Sleeper Stretch  - 1 x daily - 7 x weekly - 3 sets - 30s hold - Doorway Pec Stretch at 90 Degrees Abduction  - 1 x daily - 7 x weekly - 3 sets - 30s hold - Standing Shoulder Posterior Capsule Stretch  - 1 x daily - 7 x weekly - 3 sets - 30s hold - Standing Shoulder Row with Anchored Resistance  - 1 x daily - 3-4 x weekly - 2-3 sets - 10-12 reps - Shoulder External Rotation with Anchored Resistance with Towel Under Elbow  - 1 x daily - 3-4 x weekly - 2-3 sets - 10-12 reps - Shoulder External Rotation and Scapular Retraction with Resistance  - 1-2 x daily - 3-4 x weekly - 2-3 sets - 10-12 reps - Shoulder Internal Rotation with Resistance  - 1 x daily - 3-4 x weekly - 2-3 sets - 10-12 reps - Sidelying Shoulder Horizontal Abduction  - 1 x daily - 3-4 x weekly - 2-3 sets - 10-12 reps - Seated Shoulder External Rotation in Abduction Supported with Dumbbell  - 1 x daily - 3-4 x weekly - 2-3 sets - 10-12 reps - Standing Shoulder Horizontal Abduction with Resistance  - 1 x daily - 3-4 x weekly - 2-3 sets - 10 reps - Single Arm Shoulder Extension with Anchored Resistance  - 1 x daily - 3-4 x weekly - 2-3 sets - 10 reps  Access Code: [email or web address] URL: [email or web address] Date: 10/05/2024 Prepared by: Lonni Pall  Exercises - Seated Scapular Retraction  - 1-2 x daily - 3-4 x weekly - 2-3 sets - 10-12 reps - Sleeper Stretch  - 1 x daily - 7 x weekly - 3 sets - 30s hold - Doorway Pec Stretch at 90 Degrees Abduction  - 1 x daily - 7 x weekly - 3 sets - 30s hold - Standing Shoulder Posterior Capsule Stretch  - 1 x daily - 7 x weekly - 3 sets - 30s hold - Standing Isometric Shoulder External Rotation with Doorway  - 1 x daily - 7 x weekly - 3-5 sets - 10s hold - Standing Shoulder Row with Anchored Resistance  - 1 x daily - 3-4 x weekly - 2-3 sets - 10-12 reps - Shoulder External Rotation with  Anchored Resistance with Towel Under Elbow  - 1 x daily - 3-4 x weekly - 2-3 sets - 10-12 reps - Shoulder External Rotation and Scapular Retraction with Resistance  - 1-2 x daily - 3-4 x weekly - 2-3 sets - 10-12 reps - Shoulder Internal Rotation with Resistance  - 1 x daily - 3-4 x weekly - 2-3 sets - 10-12 reps - Sidelying Shoulder Horizontal Abduction  - 1 x daily - 3-4 x weekly - 2-3 sets - 10-12 reps - Seated Shoulder External Rotation in Abduction Supported with Dumbbell  - 1 x daily - 3-4 x weekly - 2-3 sets - 10-12 reps  ASSESSMENT:  CLINICAL IMPRESSION: Patient returned to OPPT in management of R shoulder pain secondary to RC weakness. Continued focus on progressing rotator cuff strength with shoulder abducted to 90 deg. Pt tolerated all interventions without pain in the R shoulder. Patient able to perform prolonged walking with weight (DB) however limited due to lower back pain/compensation  to remain upright. Good tolerance to increased resistance with shoulder row and lat pull down. He is agreeable to d/c from OPPT in following appt. PT to reassess progress towards goals and discharge pt to HEP.   OBJECTIVE IMPAIRMENTS: decreased activity tolerance, decreased ROM, decreased strength, and pain.   ACTIVITY LIMITATIONS: carrying, lifting, sleeping, and reach over head  PARTICIPATION LIMITATIONS: yard work  PERSONAL FACTORS: Age, Time since onset of injury/illness/exacerbation, and 3+ comorbidities: Hx of NSTEMI, HTN, CHF are also affecting patient's functional outcome.   REHAB POTENTIAL: Good  CLINICAL DECISION MAKING: Evolving/moderate complexity  EVALUATION COMPLEXITY: Moderate   GOALS: Goals reviewed with patient? Yes  SHORT TERM GOALS: Target date: 09/29/2024  Pt will be independent with HEP to improve strength and decrease shoulder pain to improve pain-free function at home and work. Baseline: 09/01/2024: Initial HEP given; 10/12/2024: 85% adherence Goal status:  Progressing   LONG TERM GOALS: Target date: 10/27/2024  Pt will increase R shoulder flexion to 140 degrees without pain in order to demonstrate limb symmetry and significantly improvements in ROM/pain for overhead ADLs. Baseline: R: 130 deg; R:145 deg Goal status: Progressing   2.  Pt will decrease worst shoulder pain by at least 3 points on the NPRS in order to demonstrate clinically significant reduction in shoulder pain. Baseline: 09/01/2024: 7/10 NPS; 10/12/2024: 3/10 Goal status: Progressing  3.  Pt will decrease quick DASH score by at least 8% in order to demonstrate clinically significant reduction in disability related to shoulder pain        Baseline: 09/01/2024: 9.1%/100 (Minimal Disability); 10/12/2024: 6.8 / 100 = 6.8 % Goal status: Progressing   4. Pt will increase strength to a 4+ MMT grade with R shoulder flexion, adb, and external rotation in order to demonstrate improvement in strength and function         Baseline: 09/01/2024:  Shoulder  R/L  10/12/24 R/L   Flexion 3+* 4+ 4/4+  Extension     Abduction 4-* 4+ 4/4+  External rotation 4-* 5 4+/5   Goal status: Progressing   5.  Pt will achieve 6-8 hours of sleep per night without report of awakening from pain in order to demonstrate significant improvements in pain.  Baseline: 09/01/2024: Patient wakes multiple times per night due to pain; 10/12/2024: Patient wakes up once or twice  Goal status: Progressing    PLAN: PT FREQUENCY: 1-2x/week  PT DURATION: 8 weeks  PLANNED INTERVENTIONS: Therapeutic exercises, Therapeutic activity, Neuromuscular re-education, Balance training, Gait training, Patient/Family education, Self Care, Joint mobilization, Joint manipulation, Vestibular training, Canalith repositioning, Orthotic/Fit training, DME instructions, Dry Needling, Electrical stimulation, Spinal manipulation, Spinal mobilization, Cryotherapy, Moist heat, Taping, Traction, Ultrasound, Ionotophoresis 4mg /ml  Dexamethasone, Manual therapy, and Re-evaluation.  PLAN FOR NEXT SESSION: Progress RC strengthening, external rotation focus, Isometrics, review goals   Lonni Pall PT, DPT Physical Therapist- [place]  10/19/2024, 2:42 PM

## 2024-10-21 ENCOUNTER — Ambulatory Visit

## 2024-10-21 DIAGNOSIS — M25511 Pain in right shoulder: Secondary | ICD-10-CM | POA: Diagnosis not present

## 2024-10-21 DIAGNOSIS — M6281 Muscle weakness (generalized): Secondary | ICD-10-CM

## 2024-10-21 DIAGNOSIS — G8929 Other chronic pain: Secondary | ICD-10-CM

## 2024-10-21 NOTE — Therapy (Signed)
 OUTPATIENT PHYSICAL THERAPY SHOULDER/ELBOW TREATMENT   Patient Name: Xavier Davis MRN: [id number] DOB:[birth year], 83 y.o., male Today's Date: 10/21/2024  END OF SESSION:  PT End of Session - 10/21/24 1303     Visit Number 13    Number of Visits 17    Date for Recertification  10/27/24    PT Start Time 1301    PT Stop Time 1336    PT Time Calculation (min) 35 min    Activity Tolerance Patient tolerated treatment well    Behavior During Therapy [facility] for tasks assessed/performed         Past Medical History:  Diagnosis Date   Anxiety    Atrial fibrillation (HCC)    BPH (benign prostatic hyperplasia)    Carotid artery disease    s/p left carotid endarterectomy at [facility] in 2014   Carotid stenosis    bilateral   CKD (chronic kidney disease)    Elevated PSA    Fracture, thoracic vertebra (HCC) 05/06/2018   T3,T9 endplate fracture   Hemothorax on left 05/06/2018   HLD (hyperlipidemia)    HTN (hypertension)    Hyperlipidemia    Hypertension    Insomnia    Rib fractures 05/06/2018   Weak urinary stream    Past Surgical History:  Procedure Laterality Date   APPENDECTOMY     CAROTID PTA/STENT INTERVENTION Right 02/26/2022   Procedure: CAROTID PTA/STENT INTERVENTION;  Surgeon: Marea Selinda RAMAN, MD;  Location: [facility] INVASIVE CV LAB;  Service: Cardiovascular;  Laterality: Right;   carotid stenosis Left    ces and stent placement   cataract surgery Bilateral    Patient Active Problem List   Diagnosis Date Noted   Congestive heart failure (HCC) 06/29/2022   Encounter for therapeutic drug level monitoring 06/29/2022   Erectile dysfunction 06/29/2022   History of anemia 06/29/2022   Hypertensive heart and chronic kidney disease with heart failure and stage 1 through stage 4 chronic kidney disease, or unspecified chronic kidney disease (HCC) 06/29/2022   Carotid artery stenosis 06/29/2022   Chronic kidney disease 06/29/2022   Atrial fibrillation (HCC) 06/29/2022   Unspecified  atrial fibrillation (HCC) 06/29/2022   Bilateral leg edema 05/03/2022   Bradycardia 05/03/2022   Acute on chronic diastolic CHF (congestive heart failure) (HCC) 03/01/2022   NSTEMI (non-ST elevated myocardial infarction) (HCC) 02/28/2022   Carotid stenosis, right 02/26/2022   Abnormal ECG 01/15/2022   Combined forms of age-related cataract, bilateral 12/26/2021   Benign neoplasm of colon 01/13/2020   Elevated prostate specific antigen (PSA) 01/13/2020   Insomnia 01/13/2020   Other ill-defined and unknown causes of morbidity and mortality 01/13/2020   Psychosexual dysfunction with inhibited sexual excitement 01/13/2020   Status post vasectomy 01/13/2020   DDD (degenerative disc disease), lumbar 07/30/2018   Anxiety 07/28/2018   Carotid stenosis 07/28/2018   CKD (chronic kidney disease) stage 3, GFR 30-59 ml/min 07/28/2018   Elevated blood sugar 07/28/2018   Occlusion and stenosis of carotid artery 07/28/2018   Paroxysmal atrial fibrillation (HCC) 07/03/2018   Leg swelling 07/03/2018   Paroxysmal A-fib (HCC) 07/03/2018   Fall from ladder 04/16/2018   Bilateral carotid artery stenosis 10/23/2016   Hypertension 10/23/2016   Hyperlipidemia 10/23/2016   Incomplete bladder emptying 07/02/2015   Benign prostatic hyperplasia 06/23/2015    PCP:  Sadie Manna, MD      REFERRING PROVIDER:  Kathlynn Sharper, MD      REFERRING DIAG:  S46.011A (ICD-10-CM) - Strain of muscle(s) and tendon(s) of the rotator cuff  of right shoulder, initial encounter    RATIONALE FOR EVALUATION AND TREATMENT: Rehabilitation  THERAPY DIAG: Chronic right shoulder pain  Muscle weakness (generalized)  ONSET DATE: 6 mos  FOLLOW-UP APPT SCHEDULED WITH REFERRING PROVIDER: No    SUBJECTIVE:                                                                                                                                                                                         SUBJECTIVE STATEMENT:     Patient reports to OPPT with R shoulder pain.   PERTINENT HISTORY:   Xavier Davis is a 83 y.o. male presenting with R shoulder pain. 6 mos ago patient reports he was pulling on a tree limb and he felt a sudden shock in his R arm. Patient reports that the pain disrupts his sleep cycle throughout the night. Additionally he reports that it painful to lift his arm past 90 degrees. Patient tried ice initially with minimal relief. Patient still continues to use RUE to perform ADLs around the house although pain is present   Patient denies chills/fever, night sweats, nausea  Hand Dominance: R Hand   Imaging: (Per Chart Review 08/24/24):  Dr. Ozell Fairy Flake: X-rays conducted on August 12, 2024, revealed moderate AC degenerative changes, joint space narrowing, and some inferior spurring of the glenohumeral joint, as well as AC arthritis.   PAIN:  Pain Intensity: Present: 1/10, Best: 0/10, Worst: 6-7/10 Pain location: R Shoulder  Pain Quality: constant and aching  Radiating: No  Numbness/Tingling: No History of prior shoulder or neck/shoulder injury, pain, surgery, or therapy: No  PRECAUTIONS: None  WEIGHT BEARING RESTRICTIONS: No  FALLS: Has patient fallen in last 6 months? No  Living Environment Lives with: lives with their spouse Lives in: House/apartment Stairs: No Has following equipment at home: None  Prior level of function: Independent  Occupational demands: Retired  Hobbies: Outdoors, Gardening  Patient Goals: Be able to lift my arm without pain and reduce the aching at night.     OBJECTIVE:   Patient Surveys  QUICK DASH  Please rate your ability do the following activities in the last week by selecting the number below the appropriate response.   Activities Rating  Open a tight or new jar.  1 = No difficulty   Do heavy household chores (e.g., wash walls, floors). 1 = No difficulty   Carry a shopping bag or briefcase 1 = No difficulty   Wash  your back. 1 = No difficulty   Use a knife to cut food. 1 = No difficulty   Recreational activities in which you take some force or impact through  your arm, shoulder or hand (e.g., golf, hammering, tennis, etc.). 2 = Mild difficulty  During the past week, to what extent has your arm, shoulder or hand problem interfered with your normal social activities with family, friends, neighbors or groups?  1 = Not at all  During the past week, were you limited in your work or other regular daily activities as a result of your arm, shoulder or hand problem? 2 = Slightly limited  Rate the severity of the following symptoms in the last week: Arm, Shoulder, or hand pain. 2 = Mild  Rate the severity of the following symptoms in the last week: Tingling (pins and needles) in your arm, shoulder or hand. 1 = none  During the past week, how much difficulty have you had sleeping because of the pain in your arm, shoulder or hand?  2 = Mild difficulty   (A QuickDASH score may not be calculated if there is greater than 1 missing item.)  Quick Dash Disability/Symptom Score: 9.1%  Minimally Clinically Important Difference (MCID): 15-20 points  Flavio, F. et al. (2013). Minimally clinically important difference of the disabilities of the arm, shoulder, and hand outcome measures (DASH) and its shortened version (Quick DASH). Journal of Orthopaedic & Sports Physical Therapy, 44(1), 30-39)   Cognition Patient is oriented to person, place, and time.  Recent memory is intact.  Remote memory is intact.  Attention span and concentration are intact.  Expressive speech is intact.  Patient's fund of knowledge is within normal limits for educational level.    Gross Musculoskeletal Assessment Tremor: None Bulk: Normal Tone: Normal  Gait   Posture   Cervical Screen AROM: WFL and painless with overpressure in all planes Spurlings A (ipsilateral lateral flexion/axial compression): R: Negative    AROM AROM  (Normal range in degrees) AROM  Cervical  Flexion (50)   Extension (80)   Right lateral flexion (45)   Left lateral flexion (45)   Right rotation (85)   Left rotation (85)    Right Left  Shoulder    Flexion 130 140  Extension    Abduction [facility]  WFL   External Rotation WFL* WFL   Internal Rotation    Hands Behind Head [facility] WFL  Hands Behind Back [facility] WFL      Elbow    Flexion    Extension    Pronation    Supination    (* = pain; Blank rows = not tested)  UE MMT: MMT (out of 5) Right Left   Cervical (isometric)  Flexion WNL  Extension WNL  Lateral Flexion WNL WNL  Rotation WNL WNL      Shoulder   Flexion 3+* 4+  Extension    Abduction 4-* 4+  External rotation 4-* 5  Internal rotation 5 5  Horizontal abduction    Horizontal adduction    Lower Trapezius    Rhomboids        Elbow  Flexion 5 5  Extension 5 5  Pronation    Supination        Wrist  Flexion    Extension    Radial deviation    Ulnar deviation        MCP  Flexion    Extension    Abduction    Adduction    (* = pain; Blank rows = not tested)  Sensation Grossly intact to light touch bilateral UE as determined by testing dermatomes C2-T2. Proprioception and hot/cold testing deferred on this date.  Reflexes Deferred  Palpation Location LEFT  RIGHT           Subocciptials    Cervical paraspinals    Upper Trapezius  0  Levator Scapulae  0  Rhomboid Major/Minor    Sternoclavicular joint    Acromioclavicular joint    Coracoid process    Long head of biceps    Supraspinatus  0  Infraspinatus  0  Subscapularis  0  Teres Minor    Teres Major    Pectoralis Major    Pectoralis Minor    Anterior Deltoid    Lateral Deltoid    Posterior Deltoid    Latissimus Dorsi    Sternocleidomastoid    (Blank rows = not tested) Graded on 0-4 scale (0 = no pain, 1 = pain, 2 = pain with wincing/grimacing/flinching, 3 = pain with withdrawal, 4 = unwilling to allow palpation), (Blank rows = not  tested)   Passive Accessory Intervertebral Motion Deferred  SPECIAL TESTS Rotator Cuff  Drop Arm Test: Negative Painful Arc (Pain from 60 to 120 degrees scaption): Positive Infraspinatus Muscle Test: Positive If all 3 tests positive, the probability of a full-thickness rotator cuff tear is 91%  TODAY'S TREATMENT  DATE: 10/21/2024  Subjective: Patient in agreeance for discharge in today's appt. Patient has no shoulder pain however has bicep pain in the AM after waking up; however it improves throughout the day. No further questions or concerns at start of session.   Therapeutic Activity (focused on improving shoulder strength in order to optimize raking, lifting and yard work such as sweeping):  UBE - 5 min - 2.5 min Fwd, 2.5 min Retro - Level 14-12 for UE  strength and muscular endurance; PT manually adjusted resistance throughout to patient's tolerance.  Push Ups from elevated Mat Table  2 x 10   Therapeutic Exercise (with intent to strengthen rotator cuff and shoulder muscles to address muscle weakness):    Standing Face Pull    2 x 10 - Red TB    Standing Horizontal Abduction against TB    2 x 10   Standing Shoulder Row   2 x 10 - Blue TB   Standing Shoulder Extension against TB    2 x 10 - Blue TB    Standing Shoulder ER against TB    2 x 10 - Red TB   Seated R GHJ ER - Abd at 90 deg on blue bolster    3 x 10 - 3# DB  PATIENT EDUCATION:  Education details: Exercise Technique  Person educated: Patient Education method: Explanation, Demonstration, and Handouts Education comprehension: verbalized understanding and returned demonstration   HOME EXERCISE PROGRAM:  Access Code: [email or web address] URL: [email or web address] Date: 10/14/2024 Prepared by: Lonni Pall  Exercises - Sleeper Stretch  - 1 x daily - 7 x weekly - 3 sets - 30s hold - Doorway Pec Stretch at 90 Degrees Abduction  - 1 x daily - 7 x weekly - 3 sets - 30s hold - Standing Shoulder  Posterior Capsule Stretch  - 1 x daily - 7 x weekly - 3 sets - 30s hold - Standing Shoulder Row with Anchored Resistance  - 1 x daily - 3-4 x weekly - 2-3 sets - 10-12 reps - Shoulder External Rotation with Anchored Resistance with Towel Under Elbow  - 1 x daily - 3-4 x weekly - 2-3 sets - 10-12 reps - Shoulder External Rotation and Scapular Retraction with Resistance  - 1-2 x daily - 3-4 x weekly - 2-3 sets -  10-12 reps - Shoulder Internal Rotation with Resistance  - 1 x daily - 3-4 x weekly - 2-3 sets - 10-12 reps - Sidelying Shoulder Horizontal Abduction  - 1 x daily - 3-4 x weekly - 2-3 sets - 10-12 reps - Seated Shoulder External Rotation in Abduction Supported with Dumbbell  - 1 x daily - 3-4 x weekly - 2-3 sets - 10-12 reps - Standing Shoulder Horizontal Abduction with Resistance  - 1 x daily - 3-4 x weekly - 2-3 sets - 10 reps - Single Arm Shoulder Extension with Anchored Resistance  - 1 x daily - 3-4 x weekly - 2-3 sets - 10 reps  Access Code: [email or web address] URL: [email or web address] Date: 10/05/2024 Prepared by: Lonni Pall  Exercises - Seated Scapular Retraction  - 1-2 x daily - 3-4 x weekly - 2-3 sets - 10-12 reps - Sleeper Stretch  - 1 x daily - 7 x weekly - 3 sets - 30s hold - Doorway Pec Stretch at 90 Degrees Abduction  - 1 x daily - 7 x weekly - 3 sets - 30s hold - Standing Shoulder Posterior Capsule Stretch  - 1 x daily - 7 x weekly - 3 sets - 30s hold - Standing Isometric Shoulder External Rotation with Doorway  - 1 x daily - 7 x weekly - 3-5 sets - 10s hold - Standing Shoulder Row with Anchored Resistance  - 1 x daily - 3-4 x weekly - 2-3 sets - 10-12 reps - Shoulder External Rotation with Anchored Resistance with Towel Under Elbow  - 1 x daily - 3-4 x weekly - 2-3 sets - 10-12 reps - Shoulder External Rotation and Scapular Retraction with Resistance  - 1-2 x daily - 3-4 x weekly - 2-3 sets - 10-12 reps - Shoulder Internal Rotation with Resistance  - 1 x daily -  3-4 x weekly - 2-3 sets - 10-12 reps - Sidelying Shoulder Horizontal Abduction  - 1 x daily - 3-4 x weekly - 2-3 sets - 10-12 reps - Seated Shoulder External Rotation in Abduction Supported with Dumbbell  - 1 x daily - 3-4 x weekly - 2-3 sets - 10-12 reps  ASSESSMENT:  CLINICAL IMPRESSION: Mr. Jaylenn Altier is a 83 y.o. male seen for R shoulder pain secondary to a rotator cuff pathology. Pt agreeable to discharge due to significant progress in R shoulder function, pain and strength. Pt has met 4/6 LT PT goals and has demonstrated independence with all HEP exercises. At this time Mr. Levinson no longer requires skilled physical therapy interventions and he is discharged to HEP to maintain progress. Time spent this section reviewing techniques with HEP; pt with good technique and able to perform without VC from PT.  No question at end of session. Follow-up with their primary care provider is recommended if any issues arise.  OBJECTIVE IMPAIRMENTS: decreased activity tolerance, decreased ROM, decreased strength, and pain.   ACTIVITY LIMITATIONS: carrying, lifting, sleeping, and reach over head  PARTICIPATION LIMITATIONS: yard work  PERSONAL FACTORS: Age, Time since onset of injury/illness/exacerbation, and 3+ comorbidities: Hx of NSTEMI, HTN, CHF are also affecting patient's functional outcome.   REHAB POTENTIAL: Good  CLINICAL DECISION MAKING: Evolving/moderate complexity  EVALUATION COMPLEXITY: Moderate   GOALS: Goals reviewed with patient? Yes  SHORT TERM GOALS: Target date: 09/29/2024  Pt will be independent with HEP to improve strength and decrease shoulder pain to improve pain-free function at home and work. Baseline: 09/01/2024: Initial HEP given; 10/12/2024: 85% adherence Goal status: Goal met  LONG TERM GOALS: Target date: 10/27/2024  Pt will increase R shoulder flexion to 140 degrees without pain in order to demonstrate limb symmetry and significantly improvements in  ROM/pain for overhead ADLs. Baseline: R: 130 deg; R:145 deg; 10/21/2024: 150 deg Goal status: Goal Met    2.  Pt will decrease worst shoulder pain by at least 3 points on the NPRS in order to demonstrate clinically significant reduction in shoulder pain. Baseline: 09/01/2024: 7/10 NPS; 10/12/2024: 3/10; 10/21/2024: 1/10 in the biceps Goal status: Goal Met   3.  Pt will decrease quick DASH score by at least 8% in order to demonstrate clinically significant reduction in disability related to shoulder pain        Baseline: 09/01/2024: 9.1%/100 (Minimal Disability); 10/12/2024: 6.8 / 100 = 6.8 %; 10/21/2024: 6.8 / 100 = 6.8 % Goal status: Progressing   4. Pt will increase strength to a 4+ MMT grade with R shoulder flexion, adb, and external rotation in order to demonstrate improvement in strength and function         Baseline: 09/01/2024:  Shoulder  R/L  10/12/24 R/L   Flexion 3+* 4+ 4/4+  Extension     Abduction 4-* 4+ 4/4+  External rotation 4-* 5 4+/5   Goal status: Progressing   5.  Pt will achieve 6-8 hours of sleep per night without report of awakening from pain in order to demonstrate significant improvements in pain.  Baseline: 09/01/2024: Patient wakes multiple times per night due to pain; 10/12/2024: Patient wakes up once or twice  Goal status: Goal met     PLAN: PT FREQUENCY: 1-2x/week  PT DURATION: 8 weeks  PLANNED INTERVENTIONS: Therapeutic exercises, Therapeutic activity, Neuromuscular re-education, Balance training, Gait training, Patient/Family education, Self Care, Joint mobilization, Joint manipulation, Vestibular training, Canalith repositioning, Orthotic/Fit training, DME instructions, Dry Needling, Electrical stimulation, Spinal manipulation, Spinal mobilization, Cryotherapy, Moist heat, Taping, Traction, Ultrasound, Ionotophoresis 4mg /ml Dexamethasone, Manual therapy, and Re-evaluation.  PLAN FOR NEXT SESSION: Progress RC strengthening, external rotation focus,  Isometrics, review goals   Lonni Pall PT, DPT Physical Therapist- [place]  10/21/2024, 1:37 PM

## 2024-10-26 ENCOUNTER — Ambulatory Visit

## 2024-10-28 ENCOUNTER — Ambulatory Visit

## 2024-11-02 ENCOUNTER — Ambulatory Visit

## 2024-11-04 ENCOUNTER — Ambulatory Visit

## 2025-01-05 ENCOUNTER — Ambulatory Visit (INDEPENDENT_AMBULATORY_CARE_PROVIDER_SITE_OTHER): Payer: Medicare Other | Admitting: Vascular Surgery

## 2025-01-05 ENCOUNTER — Encounter (INDEPENDENT_AMBULATORY_CARE_PROVIDER_SITE_OTHER): Payer: Medicare Other

## 2025-08-12 ENCOUNTER — Ambulatory Visit: Admitting: Urology
